# Patient Record
Sex: Female | Born: 1970
Health system: Southern US, Community
[De-identification: ages and names within clinical notes are randomized; demographics above are authoritative.]

## PROBLEM LIST (undated history)

## (undated) DIAGNOSIS — M1990 Unspecified osteoarthritis, unspecified site: Secondary | ICD-10-CM

## (undated) DIAGNOSIS — I10 Essential (primary) hypertension: Secondary | ICD-10-CM

## (undated) HISTORY — DX: Unspecified osteoarthritis, unspecified site: M19.90

## (undated) HISTORY — PX: ENDOMETRIAL ABLATION: SHX621

## (undated) HISTORY — PX: TUBAL LIGATION: SHX77

---

## 1999-01-09 ENCOUNTER — Emergency Department (HOSPITAL_COMMUNITY): Admission: EM | Admit: 1999-01-09 | Discharge: 1999-01-09 | Payer: Self-pay | Admitting: Emergency Medicine

## 2001-04-28 ENCOUNTER — Emergency Department (HOSPITAL_COMMUNITY): Admission: EM | Admit: 2001-04-28 | Discharge: 2001-04-28 | Payer: Self-pay | Admitting: Emergency Medicine

## 2001-08-29 ENCOUNTER — Encounter: Payer: Self-pay | Admitting: Emergency Medicine

## 2001-08-29 ENCOUNTER — Emergency Department (HOSPITAL_COMMUNITY): Admission: EM | Admit: 2001-08-29 | Discharge: 2001-08-29 | Payer: Self-pay | Admitting: Emergency Medicine

## 2001-12-08 ENCOUNTER — Emergency Department (HOSPITAL_COMMUNITY): Admission: EM | Admit: 2001-12-08 | Discharge: 2001-12-08 | Payer: Self-pay | Admitting: Emergency Medicine

## 2002-06-18 ENCOUNTER — Other Ambulatory Visit: Admission: RE | Admit: 2002-06-18 | Discharge: 2002-06-18 | Payer: Self-pay | Admitting: Family Medicine

## 2002-11-19 ENCOUNTER — Emergency Department (HOSPITAL_COMMUNITY): Admission: EM | Admit: 2002-11-19 | Discharge: 2002-11-19 | Payer: Self-pay | Admitting: Emergency Medicine

## 2003-08-17 ENCOUNTER — Emergency Department (HOSPITAL_COMMUNITY): Admission: EM | Admit: 2003-08-17 | Discharge: 2003-08-17 | Payer: Self-pay | Admitting: Emergency Medicine

## 2004-06-12 ENCOUNTER — Emergency Department (HOSPITAL_COMMUNITY): Admission: EM | Admit: 2004-06-12 | Discharge: 2004-06-12 | Payer: Self-pay | Admitting: Emergency Medicine

## 2004-11-07 ENCOUNTER — Emergency Department (HOSPITAL_COMMUNITY): Admission: EM | Admit: 2004-11-07 | Discharge: 2004-11-07 | Payer: Self-pay | Admitting: Family Medicine

## 2004-11-18 ENCOUNTER — Ambulatory Visit: Payer: Self-pay | Admitting: Family Medicine

## 2005-09-08 ENCOUNTER — Inpatient Hospital Stay (HOSPITAL_COMMUNITY): Admission: AD | Admit: 2005-09-08 | Discharge: 2005-09-08 | Payer: Self-pay | Admitting: Gynecology

## 2006-03-02 ENCOUNTER — Ambulatory Visit: Payer: Self-pay | Admitting: Family Medicine

## 2006-03-14 ENCOUNTER — Ambulatory Visit: Payer: Self-pay | Admitting: Family Medicine

## 2006-03-16 ENCOUNTER — Ambulatory Visit: Payer: Self-pay | Admitting: *Deleted

## 2006-09-23 ENCOUNTER — Ambulatory Visit: Payer: Self-pay | Admitting: Internal Medicine

## 2006-10-19 ENCOUNTER — Encounter (INDEPENDENT_AMBULATORY_CARE_PROVIDER_SITE_OTHER): Payer: Self-pay | Admitting: *Deleted

## 2006-11-23 ENCOUNTER — Ambulatory Visit: Payer: Self-pay | Admitting: Internal Medicine

## 2006-11-23 ENCOUNTER — Encounter (INDEPENDENT_AMBULATORY_CARE_PROVIDER_SITE_OTHER): Payer: Self-pay | Admitting: Family Medicine

## 2006-11-23 LAB — CONVERTED CEMR LAB
ALT: 11 units/L (ref 0–35)
AST: 13 units/L (ref 0–37)
Albumin: 4.3 g/dL (ref 3.5–5.2)
Alkaline Phosphatase: 96 units/L (ref 39–117)
BUN: 9 mg/dL (ref 6–23)
Basophils Absolute: 0 10*3/uL (ref 0.0–0.1)
Basophils Relative: 0 % (ref 0–1)
CO2: 24 meq/L (ref 19–32)
Calcium: 9.3 mg/dL (ref 8.4–10.5)
Chlamydia, DNA Probe: NEGATIVE
Chloride: 103 meq/L (ref 96–112)
Cholesterol: 149 mg/dL (ref 0–200)
Creatinine, Ser: 0.92 mg/dL (ref 0.40–1.20)
Eosinophils Absolute: 0.1 10*3/uL (ref 0.0–0.7)
Eosinophils Relative: 1 % (ref 0–5)
GC Probe Amp, Genital: NEGATIVE
Glucose, Bld: 97 mg/dL (ref 70–99)
HCT: 39.3 % (ref 36.0–46.0)
HDL: 63 mg/dL (ref >39)
Hemoglobin: 12.7 g/dL (ref 12.0–15.0)
LDL Cholesterol: 66 mg/dL (ref 0–99)
Lymphocytes Relative: 33 % (ref 12–46)
Lymphs Abs: 2.7 10*3/uL (ref 0.7–3.3)
MCHC: 32.3 g/dL (ref 30.0–36.0)
MCV: 85.8 fL (ref 78.0–100.0)
Monocytes Absolute: 0.5 10*3/uL (ref 0.2–0.7)
Monocytes Relative: 6 % (ref 3–11)
Neutro Abs: 4.7 10*3/uL (ref 1.7–7.7)
Neutrophils Relative %: 59 % (ref 43–77)
Platelets: 421 10*3/uL — ABNORMAL HIGH (ref 150–400)
Potassium: 3.9 meq/L (ref 3.5–5.3)
RBC: 4.58 M/uL (ref 3.87–5.11)
RDW: 15.1 % — ABNORMAL HIGH (ref 11.5–14.0)
Sodium: 140 meq/L (ref 135–145)
Total Bilirubin: 0.3 mg/dL (ref 0.3–1.2)
Total CHOL/HDL Ratio: 2.4
Total Protein: 7.9 g/dL (ref 6.0–8.3)
Triglycerides: 102 mg/dL (ref <150)
VLDL: 20 mg/dL (ref 0–40)
WBC: 8 10*3/uL (ref 4.0–10.5)

## 2007-04-18 ENCOUNTER — Inpatient Hospital Stay (HOSPITAL_COMMUNITY): Admission: AD | Admit: 2007-04-18 | Discharge: 2007-04-18 | Payer: Self-pay | Admitting: Gynecology

## 2007-05-08 ENCOUNTER — Other Ambulatory Visit: Admission: RE | Admit: 2007-05-08 | Discharge: 2007-05-08 | Payer: Self-pay | Admitting: Obstetrics and Gynecology

## 2007-11-06 ENCOUNTER — Other Ambulatory Visit: Admission: RE | Admit: 2007-11-06 | Discharge: 2007-11-06 | Payer: Self-pay | Admitting: Obstetrics & Gynecology

## 2008-06-07 ENCOUNTER — Other Ambulatory Visit: Admission: RE | Admit: 2008-06-07 | Discharge: 2008-06-07 | Payer: Self-pay | Admitting: Obstetrics and Gynecology

## 2008-10-14 ENCOUNTER — Emergency Department (HOSPITAL_COMMUNITY): Admission: EM | Admit: 2008-10-14 | Discharge: 2008-10-14 | Payer: Self-pay | Admitting: Family Medicine

## 2010-02-11 ENCOUNTER — Encounter (INDEPENDENT_AMBULATORY_CARE_PROVIDER_SITE_OTHER): Payer: Self-pay | Admitting: *Deleted

## 2010-02-11 LAB — CONVERTED CEMR LAB
ALT: 10 U/L
AST: 11 U/L
Albumin: 4.3 g/dL
Alkaline Phosphatase: 107 U/L
BUN: 9 mg/dL
CO2: 24 meq/L
Calcium: 9.7 mg/dL
Chloride: 102 meq/L
Creatinine, Ser: 0.97 mg/dL
Glucose, Bld: 100 mg/dL — ABNORMAL HIGH
HCT: 37.8 %
Hemoglobin: 11.5 g/dL — ABNORMAL LOW
MCHC: 30.4 g/dL
MCV: 81.1 fL
Platelets: 408 K/uL — ABNORMAL HIGH
Potassium: 4.1 meq/L
RBC: 4.66 M/uL
RDW: 17.8 % — ABNORMAL HIGH
Sodium: 141 meq/L
TSH: 1.538 u[IU]/mL
Total Bilirubin: 0.2 mg/dL — ABNORMAL LOW
Total Protein: 7.6 g/dL
WBC: 8.9 10*3/microliter

## 2010-02-12 ENCOUNTER — Encounter (INDEPENDENT_AMBULATORY_CARE_PROVIDER_SITE_OTHER): Payer: Self-pay | Admitting: *Deleted

## 2010-02-12 LAB — CONVERTED CEMR LAB: Ferritin: 7 ng/mL — ABNORMAL LOW (ref 10–291)

## 2010-03-13 ENCOUNTER — Other Ambulatory Visit: Payer: Self-pay | Admitting: Family Medicine

## 2010-03-13 ENCOUNTER — Encounter: Payer: Self-pay | Admitting: Internal Medicine

## 2010-03-13 LAB — CONVERTED CEMR LAB: Chlamydia, DNA Probe: POSITIVE — AB

## 2010-04-30 ENCOUNTER — Encounter: Payer: Self-pay | Admitting: Obstetrics & Gynecology

## 2010-05-11 ENCOUNTER — Encounter: Payer: Self-pay | Admitting: Family Medicine

## 2010-07-17 ENCOUNTER — Encounter: Payer: Self-pay | Admitting: Obstetrics & Gynecology

## 2010-10-26 LAB — COMPREHENSIVE METABOLIC PANEL
ALT: 13
AST: 14
Alkaline Phosphatase: 111
CO2: 26
Calcium: 9
Chloride: 102
GFR calc Af Amer: >60
GFR calc non Af Amer: >60
Glucose, Bld: 116 — ABNORMAL HIGH
Potassium: 3.6
Sodium: 137

## 2010-10-26 LAB — CBC
Hemoglobin: 12.3
RBC: 4.33
WBC: 11.5 — ABNORMAL HIGH

## 2010-10-26 LAB — DIFFERENTIAL
Basophils Relative: 0
Eosinophils Absolute: 0.1
Eosinophils Relative: 1
Lymphs Abs: 2.5
Neutrophils Relative %: 73

## 2010-10-26 LAB — URINALYSIS, ROUTINE W REFLEX MICROSCOPIC
Bilirubin Urine: NEGATIVE
Glucose, UA: NEGATIVE
Hgb urine dipstick: NEGATIVE
Ketones, ur: NEGATIVE
Protein, ur: NEGATIVE
pH: 7.5

## 2010-10-26 LAB — AMYLASE: Amylase: 57

## 2010-10-26 LAB — LIPASE, BLOOD: Lipase: 18

## 2010-10-26 LAB — WET PREP, GENITAL

## 2010-10-26 LAB — GC/CHLAMYDIA PROBE AMP, GENITAL: GC Probe Amp, Genital: NEGATIVE

## 2010-11-18 ENCOUNTER — Inpatient Hospital Stay (INDEPENDENT_AMBULATORY_CARE_PROVIDER_SITE_OTHER)
Admission: RE | Admit: 2010-11-18 | Discharge: 2010-11-18 | Disposition: A | Discharge status: Home / Self Care | Payer: Self-pay | Source: Ambulatory Visit | Attending: Emergency Medicine | Admitting: Emergency Medicine

## 2010-11-18 DIAGNOSIS — N760 Acute vaginitis: Secondary | ICD-10-CM

## 2010-11-18 LAB — WET PREP, GENITAL
Trich, Wet Prep: NONE SEEN
Yeast Wet Prep HPF POC: NONE SEEN

## 2010-11-18 LAB — POCT URINALYSIS DIP (DEVICE)
Bilirubin Urine: NEGATIVE
Glucose, UA: NEGATIVE mg/dL
Hgb urine dipstick: NEGATIVE
Ketones, ur: NEGATIVE mg/dL
Leukocytes, UA: NEGATIVE
Nitrite: NEGATIVE

## 2011-02-11 ENCOUNTER — Encounter (HOSPITAL_COMMUNITY): Payer: Self-pay | Admitting: *Deleted

## 2011-02-11 ENCOUNTER — Emergency Department (HOSPITAL_COMMUNITY)
Admission: EM | Admit: 2011-02-11 | Discharge: 2011-02-12 | Disposition: A | Discharge status: Home / Self Care | Payer: Self-pay | Attending: Emergency Medicine | Admitting: Emergency Medicine

## 2011-02-11 DIAGNOSIS — R070 Pain in throat: Secondary | ICD-10-CM | POA: Insufficient documentation

## 2011-02-11 DIAGNOSIS — J111 Influenza due to unidentified influenza virus with other respiratory manifestations: Secondary | ICD-10-CM | POA: Insufficient documentation

## 2011-02-11 DIAGNOSIS — H9209 Otalgia, unspecified ear: Secondary | ICD-10-CM | POA: Insufficient documentation

## 2011-02-11 DIAGNOSIS — R6883 Chills (without fever): Secondary | ICD-10-CM | POA: Insufficient documentation

## 2011-02-11 DIAGNOSIS — R51 Headache: Secondary | ICD-10-CM | POA: Insufficient documentation

## 2011-02-11 DIAGNOSIS — I10 Essential (primary) hypertension: Secondary | ICD-10-CM | POA: Insufficient documentation

## 2011-02-11 HISTORY — DX: Essential (primary) hypertension: I10

## 2011-02-11 MED ORDER — HYDROCODONE-ACETAMINOPHEN 7.5-500 MG/15ML PO SOLN: 15.0000 mL | Freq: Four times a day (QID) | ORAL | Status: AC | PRN

## 2011-02-11 NOTE — ED Provider Notes (Signed)
History     CSN: [id number]  Arrival date & time 02/11/11  2111   First MD Initiated Contact with Patient 02/11/11 2337      Chief Complaint  Patient presents with  . Sore Throat    (Consider location/radiation/quality/duration/timing/severity/associated sxs/prior treatment) HPI C/o a sorethroat Lt earache with a headache since Monday Chills Monday night  Past Medical History  Diagnosis Date  . Hypertension     History reviewed. No pertinent past surgical history.  History reviewed. No pertinent family history.  History  Substance Use Topics  . Smoking status: Never Smoker   . Smokeless tobacco: Not on file  . Alcohol Use: No    OB History    Grav Para Term Preterm Abortions TAB SAB Ect Mult Living                  Review of Systems Remaining review of systems unremarkable except as noted in history of present illness Allergies  Review of patient's allergies indicates no known allergies.  Home Medications   Current Outpatient Rx  Name Route Sig Dispense Refill  . HYDROCODONE-ACETAMINOPHEN 7.5-500 MG/15ML PO SOLN Oral Take 15 mLs by mouth every 6 (six) hours as needed for pain. 120 mL 0    BP 138/98  Pulse 103  Temp(Src) 98.2 F (36.8 C) (Oral)  Resp 14  SpO2 99%  LMP 02/01/2011  Physical Exam  Nursing note and vitals reviewed. Constitutional: She is oriented to person, place, and time. She appears well-developed and well-nourished. No distress.  HENT:  Head: Normocephalic and atraumatic.  Right Ear: External ear normal.  Left Ear: External ear normal.  Mouth/Throat: Oropharynx is clear and moist.  Eyes: Pupils are equal, round, and reactive to light.  Neck: Normal range of motion.  Cardiovascular: Normal rate and intact distal pulses.   Pulmonary/Chest: No respiratory distress.  Abdominal: Normal appearance. She exhibits no distension.  Musculoskeletal: Normal range of motion.  Neurological: She is alert and oriented to person, place, and  time. No cranial nerve deficit.  Skin: Skin is warm and dry. No rash noted.  Psychiatric: She has a normal mood and affect. Her behavior is normal.    ED Course  Procedures (including critical care time)  Labs Reviewed - No data to display No results found.   1. Influenza       MDM          Nelia Shi, MD 02/11/11 2348

## 2011-02-11 NOTE — ED Notes (Signed)
C/o a sorethroat  Lt earache with a headache since Monday  Chills Monday night

## 2011-02-12 NOTE — ED Notes (Signed)
Discharge inst given  Voiced unnderstanding

## 2011-03-09 ENCOUNTER — Inpatient Hospital Stay (HOSPITAL_COMMUNITY)
Admission: AD | Admit: 2011-03-09 | Discharge: 2011-03-09 | Disposition: A | Discharge status: Home / Self Care | Payer: Self-pay | Source: Ambulatory Visit | Attending: Obstetrics & Gynecology | Admitting: Obstetrics & Gynecology

## 2011-03-09 DIAGNOSIS — R109 Unspecified abdominal pain: Secondary | ICD-10-CM | POA: Insufficient documentation

## 2011-03-09 DIAGNOSIS — B9689 Other specified bacterial agents as the cause of diseases classified elsewhere: Secondary | ICD-10-CM | POA: Insufficient documentation

## 2011-03-09 DIAGNOSIS — R102 Pelvic and perineal pain: Secondary | ICD-10-CM

## 2011-03-09 DIAGNOSIS — N949 Unspecified condition associated with female genital organs and menstrual cycle: Secondary | ICD-10-CM

## 2011-03-09 DIAGNOSIS — A499 Bacterial infection, unspecified: Secondary | ICD-10-CM | POA: Insufficient documentation

## 2011-03-09 DIAGNOSIS — N760 Acute vaginitis: Secondary | ICD-10-CM | POA: Insufficient documentation

## 2011-03-09 LAB — WET PREP, GENITAL: Trich, Wet Prep: NONE SEEN

## 2011-03-09 LAB — URINALYSIS, ROUTINE W REFLEX MICROSCOPIC
Bilirubin Urine: NEGATIVE
Hgb urine dipstick: NEGATIVE
Specific Gravity, Urine: 1.015 (ref 1.005–1.030)
pH: 6.5 (ref 5.0–8.0)

## 2011-03-09 MED ORDER — METRONIDAZOLE 500 MG PO TABS: 500.0000 mg | ORAL_TABLET | Freq: Two times a day (BID) | ORAL | Status: AC

## 2011-03-09 MED ORDER — ACETAMINOPHEN 500 MG PO TABS
1000.0000 mg | ORAL_TABLET | Freq: Once | ORAL | Status: AC
  Administered 2011-03-09: 1000 mg via ORAL
  Filled 2011-03-09: qty 2

## 2011-03-09 NOTE — ED Provider Notes (Signed)
History   Pt presents today c/o lower abd pain and cramping for the past 2-4 days. She also c/o vag dc with irritation. She also c/o slight HA. She denies blurry vision, fever, dysuria, or any other problems at this time.  Chief Complaint  Patient presents with  . Abdominal Pain   HPI  OB History    Grav Para Term Preterm Abortions TAB SAB Ect Mult Living                  Past Medical History  Diagnosis Date  . Hypertension     No past surgical history on file.  No family history on file.  History  Substance Use Topics  . Smoking status: Never Smoker   . Smokeless tobacco: Not on file  . Alcohol Use: No    Allergies: No Known Allergies  Prescriptions prior to admission  Medication Sig Dispense Refill  . acetaminophen (TYLENOL) 500 MG tablet Take 500 mg by mouth every 6 (six) hours as needed. For stomach pain.        Review of Systems  Constitutional: Negative for fever and chills.  Eyes: Negative for blurred vision and double vision.  Respiratory: Negative for cough, hemoptysis, sputum production, shortness of breath and wheezing.   Cardiovascular: Negative for chest pain and palpitations.  Gastrointestinal: Positive for abdominal pain. Negative for nausea and vomiting.  Genitourinary: Negative for dysuria, urgency, frequency and hematuria.  Skin: Negative for rash.  Neurological: Positive for headaches. Negative for dizziness.  Psychiatric/Behavioral: Negative for depression and suicidal ideas.   Physical Exam   Blood pressure 137/107, pulse 81, temperature 98.1 F (36.7 C), temperature source Oral, resp. rate 16, height 5\' 3"  (1.6 m), weight 230 lb 3.2 oz (104.418 kg), last menstrual period 02/28/2011, SpO2 99.00%.  Physical Exam  Nursing note and vitals reviewed. Constitutional: She is oriented to person, place, and time. She appears well-developed and well-nourished. No distress.  HENT:  Head: Normocephalic and atraumatic.  Eyes: EOM are normal. Pupils  are equal, round, and reactive to light.  GI: Soft. She exhibits no distension and no mass. There is no tenderness. There is no rebound and no guarding.  Genitourinary: No bleeding around the vagina. Vaginal discharge found.       Cervix Lg/closed. Uterus NL size and shape with no definite adnexal masses. However, exam difficult secondary to morbid obesity.  Neurological: She is alert and oriented to person, place, and time.  Skin: Skin is warm and dry. She is not diaphoretic.  Psychiatric: She has a normal mood and affect. Her behavior is normal. Judgment and thought content normal.    MAU Course  Procedures  Wet prep and GC/Chlamydia cultures done.  Results for orders placed during the hospital encounter of 03/09/11 (from the past 72 hour(s))  URINALYSIS, ROUTINE W REFLEX MICROSCOPIC     Status: Normal   Collection Time   03/09/11  9:20 AM      Component Value Range Comment   Color, Urine YELLOW  YELLOW     APPearance CLEAR  CLEAR     Specific Gravity, Urine 1.015  1.005 - 1.030     pH 6.5  5.0 - 8.0     Glucose, UA NEGATIVE  NEGATIVE (mg/dL)    Hgb urine dipstick NEGATIVE  NEGATIVE     Bilirubin Urine NEGATIVE  NEGATIVE     Ketones, ur NEGATIVE  NEGATIVE (mg/dL)    Protein, ur NEGATIVE  NEGATIVE (mg/dL)    Urobilinogen, UA 0.2  0.0 - 1.0 (mg/dL)    Nitrite NEGATIVE  NEGATIVE     Leukocytes, UA NEGATIVE  NEGATIVE  MICROSCOPIC NOT DONE ON URINES WITH NEGATIVE PROTEIN, BLOOD, LEUKOCYTES, NITRITE, OR GLUCOSE <1000 mg/dL.  POCT PREGNANCY, URINE     Status: Normal   Collection Time   03/09/11  9:44 AM      Component Value Range Comment   Preg Test, Ur NEGATIVE  NEGATIVE    WET PREP, GENITAL     Status: Abnormal   Collection Time   03/09/11  9:51 AM      Component Value Range Comment   Yeast Wet Prep HPF POC NONE SEEN  NONE SEEN     Trich, Wet Prep NONE SEEN  NONE SEEN     Clue Cells Wet Prep HPF POC FEW (*) NONE SEEN     WBC, Wet Prep HPF POC FEW (*) NONE SEEN  FEW BACTERIA SEEN     Assessment and Plan  BV: discussed with pt at length. Will tx with Flagyl. Warned of antabuse reaction.   Pelvic pain: no acute process at this time. Advised pt to f/u with PCP. Discussed diet, activity, risks, and precautions.  Clinton Gallant. Kathy Wares III, DrHSc, MPAS, PA-C  03/09/2011, 9:53 AM   Henrietta Hoover, PA 03/09/11 1025

## 2011-03-09 NOTE — ED Provider Notes (Signed)
Attestation of Attending Supervision of Advanced Practitioner: Evaluation and management procedures were performed by the PA/NP/CNM/OB Fellow under my supervision/collaboration. Chart reviewed, and agree with management and plan.  Jaynie Collins, M.D. 03/09/2011 11:25 AM

## 2011-03-09 NOTE — Progress Notes (Signed)
States has been on BP medication. When she was seen last at [facility], she saw a different doctor than she usually sees. He told her that her BP was ok and that she no longer needed to take medication. Patient states she still has some refills on her medication and plans to resume.

## 2011-03-09 NOTE — Progress Notes (Signed)
Patient states she started having lower abdominal pain that radiates to the low back on 2-4. Has a slight yellow vaginal discharge this am.

## 2011-03-10 LAB — GC/CHLAMYDIA PROBE AMP, GENITAL
Chlamydia, DNA Probe: NEGATIVE
GC Probe Amp, Genital: NEGATIVE

## 2012-08-07 ENCOUNTER — Inpatient Hospital Stay (HOSPITAL_COMMUNITY)
Admission: AD | Admit: 2012-08-07 | Discharge: 2012-08-07 | Disposition: A | Discharge status: Home / Self Care | Payer: Medicaid Other | Source: Ambulatory Visit | Attending: Obstetrics & Gynecology | Admitting: Obstetrics & Gynecology

## 2012-08-07 ENCOUNTER — Encounter (HOSPITAL_COMMUNITY): Payer: Self-pay

## 2012-08-07 DIAGNOSIS — N390 Urinary tract infection, site not specified: Secondary | ICD-10-CM | POA: Insufficient documentation

## 2012-08-07 DIAGNOSIS — R300 Dysuria: Secondary | ICD-10-CM | POA: Insufficient documentation

## 2012-08-07 LAB — URINALYSIS, ROUTINE W REFLEX MICROSCOPIC
Bilirubin Urine: NEGATIVE
Ketones, ur: NEGATIVE mg/dL
Protein, ur: NEGATIVE mg/dL
Urobilinogen, UA: 0.2 mg/dL (ref 0.0–1.0)

## 2012-08-07 LAB — URINE MICROSCOPIC-ADD ON

## 2012-08-07 LAB — WET PREP, GENITAL: Yeast Wet Prep HPF POC: NONE SEEN

## 2012-08-07 MED ORDER — CIPROFLOXACIN HCL 500 MG PO TABS: 500.0000 mg | ORAL_TABLET | Freq: Two times a day (BID) | ORAL | Status: AC

## 2012-08-07 NOTE — MAU Note (Signed)
Patient is in with c/o pelvic pain that radiates to her lower back, dysuria since Thursday and odorous vaginal discharge. Patient reports having tubal ligation.

## 2012-08-07 NOTE — MAU Note (Signed)
Started last Thurs, with lower abd and low back when uses restroom.  Now feeling pressure also, urgency-seems like bladder stays full.

## 2012-08-07 NOTE — MAU Provider Note (Signed)
Chief Complaint: Pelvic Pain   First Provider Initiated Contact with Patient 08/07/12 1311     SUBJECTIVE HPI: Renee Collier is a 42 y.o. [id number] who presents to maternity admissions reporting dysuria and pelvic pressure when she urinates.  She also reports pink bleeding when she wipes and an increase in vaginal discharge without odor.  She denies vaginal itching/burning, h/a, dizziness, n/v, or fever/chills.  .   Past Medical History  Diagnosis Date  . Hypertension    Past Surgical History  Procedure Laterality Date  . Tubal ligation     History   Social History  . Marital Status: Single    Spouse Name: N/A    Number of Children: N/A  . Years of Education: N/A   Occupational History  . Not on file.   Social History Main Topics  . Smoking status: Never Smoker   . Smokeless tobacco: Not on file  . Alcohol Use: No  . Drug Use: No  . Sexually Active: Yes    Birth Control/ Protection: Surgical   Other Topics Concern  . Not on file   Social History Narrative  . No narrative on file   No current facility-administered medications on file prior to encounter.   Current Outpatient Prescriptions on File Prior to Encounter  Medication Sig Dispense Refill  . acetaminophen (TYLENOL) 500 MG tablet Take 1,000 mg by mouth every 6 (six) hours as needed for pain. For stomach pain.       No Known Allergies  ROS: Pertinent items in HPI  OBJECTIVE Blood pressure 130/85, pulse 87, temperature 98.6 F (37 C), temperature source Oral, resp. rate 20, weight 99.338 kg (219 lb), last menstrual period 07/07/2012. GENERAL: Well-developed, well-nourished female in no acute distress.  HEENT: Normocephalic HEART: normal rate RESP: normal effort ABDOMEN: Soft, non-tender EXTREMITIES: Nontender, no edema NEURO: Alert and oriented Pelvic exam: Cervix pink, visually closed, without lesion, scant white creamy discharge, vaginal walls and external genitalia normal Bimanual exam: Cervix  0/long/high, firm, anterior, neg CMT, uterus nontender, nonenlarged, adnexa without tenderness, enlargement, or mass  LAB RESULTS Results for orders placed during the hospital encounter of 08/07/12 (from the past 24 hour(s))  URINALYSIS, ROUTINE W REFLEX MICROSCOPIC     Status: Abnormal   Collection Time    08/07/12 12:20 PM      Result Value Range   Color, Urine YELLOW  YELLOW   APPearance HAZY (*) CLEAR   Specific Gravity, Urine 1.020  1.005 - 1.030   pH 7.5  5.0 - 8.0   Glucose, UA NEGATIVE  NEGATIVE mg/dL   Hgb urine dipstick TRACE (*) NEGATIVE   Bilirubin Urine NEGATIVE  NEGATIVE   Ketones, ur NEGATIVE  NEGATIVE mg/dL   Protein, ur NEGATIVE  NEGATIVE mg/dL   Urobilinogen, UA 0.2  0.0 - 1.0 mg/dL   Nitrite NEGATIVE  NEGATIVE   Leukocytes, UA NEGATIVE  NEGATIVE  URINE MICROSCOPIC-ADD ON     Status: Abnormal   Collection Time    08/07/12 12:20 PM      Result Value Range   Squamous Epithelial / LPF MANY (*) RARE   WBC, UA 0-2  <3 WBC/hpf   RBC / HPF 0-2  <3 RBC/hpf   Bacteria, UA RARE  RARE  POCT PREGNANCY, URINE     Status: None   Collection Time    08/07/12 12:33 PM      Result Value Range   Preg Test, Ur NEGATIVE  NEGATIVE  WET PREP, GENITAL  Status: Abnormal   Collection Time    08/07/12  1:54 PM      Result Value Range   Yeast Wet Prep HPF POC NONE SEEN  NONE SEEN   Trich, Wet Prep NONE SEEN  NONE SEEN   Clue Cells Wet Prep HPF POC NONE SEEN  NONE SEEN   WBC, Wet Prep HPF POC FEW (*) NONE SEEN    ASSESSMENT 1. UTI (urinary tract infection), uncomplicated     PLAN Discharge home Cipro BID x3 days Increase PO fluids F/U with Gyn provider Return to MAU as needed   Sharen Counter Certified Nurse-Midwife 08/07/2012  2:18 PM

## 2012-08-08 LAB — GC/CHLAMYDIA PROBE AMP: CT Probe RNA: NEGATIVE

## 2012-08-09 LAB — URINE CULTURE: Colony Count: >=100000

## 2012-08-09 NOTE — MAU Provider Note (Signed)
Attestation of Attending Supervision of Advanced Practitioner (PA/CNM/NP): Evaluation and management procedures were performed by the Advanced Practitioner under my supervision and collaboration.  I have reviewed the Advanced Practitioner's note and chart, and I agree with the management and plan.  Arihanna Estabrook, MD, FACOG Attending Obstetrician & Gynecologist Faculty Practice, [facility]  

## 2013-03-29 ENCOUNTER — Ambulatory Visit: Payer: Medicaid Other

## 2013-09-12 ENCOUNTER — Encounter (HOSPITAL_COMMUNITY): Payer: Self-pay

## 2013-09-12 ENCOUNTER — Inpatient Hospital Stay (HOSPITAL_COMMUNITY)
Admission: AD | Admit: 2013-09-12 | Discharge: 2013-09-12 | Disposition: A | Discharge status: Home / Self Care | Payer: Medicaid Other | Source: Ambulatory Visit | Attending: Obstetrics & Gynecology | Admitting: Obstetrics & Gynecology

## 2013-09-12 DIAGNOSIS — R109 Unspecified abdominal pain: Secondary | ICD-10-CM

## 2013-09-12 DIAGNOSIS — I10 Essential (primary) hypertension: Secondary | ICD-10-CM | POA: Diagnosis not present

## 2013-09-12 DIAGNOSIS — N760 Acute vaginitis: Secondary | ICD-10-CM

## 2013-09-12 DIAGNOSIS — B9689 Other specified bacterial agents as the cause of diseases classified elsewhere: Secondary | ICD-10-CM | POA: Diagnosis not present

## 2013-09-12 DIAGNOSIS — A499 Bacterial infection, unspecified: Secondary | ICD-10-CM | POA: Insufficient documentation

## 2013-09-12 LAB — URINALYSIS, ROUTINE W REFLEX MICROSCOPIC
BILIRUBIN URINE: NEGATIVE
Glucose, UA: NEGATIVE mg/dL
Hgb urine dipstick: NEGATIVE
KETONES UR: NEGATIVE mg/dL
Leukocytes, UA: NEGATIVE
NITRITE: NEGATIVE
Protein, ur: NEGATIVE mg/dL
SPECIFIC GRAVITY, URINE: 1.01 (ref 1.005–1.030)
UROBILINOGEN UA: 0.2 mg/dL (ref 0.0–1.0)
pH: 8 (ref 5.0–8.0)

## 2013-09-12 LAB — CBC
HCT: 35.4 % — ABNORMAL LOW (ref 36.0–46.0)
HEMOGLOBIN: 11.1 g/dL — AB (ref 12.0–15.0)
MCH: 24.8 pg — ABNORMAL LOW (ref 26.0–34.0)
MCHC: 31.4 g/dL (ref 30.0–36.0)
MCV: 79 fL (ref 78.0–100.0)
Platelets: 474 10*3/uL — ABNORMAL HIGH (ref 150–400)
RBC: 4.48 MIL/uL (ref 3.87–5.11)
RDW: 16.1 % — ABNORMAL HIGH (ref 11.5–15.5)
WBC: 7.5 10*3/uL (ref 4.0–10.5)

## 2013-09-12 LAB — WET PREP, GENITAL
Trich, Wet Prep: NONE SEEN
Yeast Wet Prep HPF POC: NONE SEEN

## 2013-09-12 LAB — POCT PREGNANCY, URINE: Preg Test, Ur: NEGATIVE

## 2013-09-12 MED ORDER — KETOROLAC TROMETHAMINE 60 MG/2ML IM SOLN
60.0000 mg | Freq: Once | INTRAMUSCULAR | Status: AC
  Administered 2013-09-12: 60 mg via INTRAMUSCULAR
  Filled 2013-09-12: qty 2

## 2013-09-12 MED ORDER — HYDRALAZINE HCL 10 MG PO TABS: 10.0000 mg | ORAL_TABLET | Freq: Three times a day (TID) | ORAL | Status: DC

## 2013-09-12 MED ORDER — METRONIDAZOLE 500 MG PO TABS: 500.0000 mg | ORAL_TABLET | Freq: Two times a day (BID) | ORAL | Status: DC

## 2013-09-12 NOTE — MAU Note (Signed)
Pain started on Sat. Pain is in LLQ,Radiates/pressure down leg when walk. Noted some d/c yesterday

## 2013-09-12 NOTE — MAU Provider Note (Signed)
History     CSN: [id number]  Arrival date and time: 09/12/13 1017   First Provider Initiated Contact with Patient 09/12/13 1059      Chief Complaint  Patient presents with  . Abdominal Pain   HPI  [name] is a 43 y.o. female [phone] who presents with complaint of left sided abdominal pain. The pain started Saturday while she was at work. The pain started as cramping; worse when she walks. The pain is described as cramping/pressure. She also complains of a vaginal discharge that is described at dark yellow- no odor. She has normal menstrual cycles; LMP August 3rd and it was normal. Last intercourse was 1 month ago; no new sexual partners.  Pt has a history of hypertension; stopped taking her BP medication (hydralazine due to insurance) unable to see a Dr. Until September.  She does have an appointment scheduled in September for a physical with a PCP.  She rates her left sided abdominal pain 9/10; pt has not taken anything for pain today.    OB History   Grav Para Term Preterm Abortions TAB SAB Ect Mult Living   3 3 3       3       Past Medical History  Diagnosis Date  . Hypertension     Past Surgical History  Procedure Laterality Date  . Tubal ligation      History reviewed. No pertinent family history.  History  Substance Use Topics  . Smoking status: Never Smoker   . Smokeless tobacco: Not on file  . Alcohol Use: No    Allergies: No Known Allergies  Prescriptions prior to admission  Medication Sig Dispense Refill  . acetaminophen (TYLENOL) 500 MG tablet Take 1,000 mg by mouth every 6 (six) hours as needed for pain. For stomach pain.       Results for orders placed during the hospital encounter of 09/12/13 (from the past 48 hour(s))  URINALYSIS, ROUTINE W REFLEX MICROSCOPIC     Status: None   Collection Time    09/12/13 10:35 AM      Result Value Ref Range   Color, Urine YELLOW  YELLOW   APPearance CLEAR  CLEAR   Specific Gravity, Urine 1.010   1.005 - 1.030   pH 8.0  5.0 - 8.0   Glucose, UA NEGATIVE  NEGATIVE mg/dL   Hgb urine dipstick NEGATIVE  NEGATIVE   Bilirubin Urine NEGATIVE  NEGATIVE   Ketones, ur NEGATIVE  NEGATIVE mg/dL   Protein, ur NEGATIVE  NEGATIVE mg/dL   Urobilinogen, UA 0.2  0.0 - 1.0 mg/dL   Nitrite NEGATIVE  NEGATIVE   Leukocytes, UA NEGATIVE  NEGATIVE   Comment: MICROSCOPIC NOT DONE ON URINES WITH NEGATIVE PROTEIN, BLOOD, LEUKOCYTES, NITRITE, OR GLUCOSE <1000 mg/dL.  POCT PREGNANCY, URINE     Status: None   Collection Time    09/12/13 10:45 AM      Result Value Ref Range   Preg Test, Ur NEGATIVE  NEGATIVE   Comment:            THE SENSITIVITY OF THIS     METHODOLOGY IS >24 mIU/mL  CBC     Status: Abnormal   Collection Time    09/12/13 11:19 AM      Result Value Ref Range   WBC 7.5  4.0 - 10.5 K/uL   RBC 4.48  3.87 - 5.11 MIL/uL   Hemoglobin 11.1 (*) 12.0 - 15.0 g/dL   HCT 35.4 (*) 36.0 - 46.0 %  MCV 79.0  78.0 - 100.0 fL   MCH 24.8 (*) 26.0 - 34.0 pg   MCHC 31.4  30.0 - 36.0 g/dL   RDW 16.1 (*) 11.5 - 15.5 %   Platelets 474 (*) 150 - 400 K/uL  WET PREP, GENITAL     Status: Abnormal   Collection Time    09/12/13 11:20 AM      Result Value Ref Range   Yeast Wet Prep HPF POC NONE SEEN  NONE SEEN   Trich, Wet Prep NONE SEEN  NONE SEEN   Clue Cells Wet Prep HPF POC FEW (*) NONE SEEN   WBC, Wet Prep HPF POC FEW (*) NONE SEEN   Comment: FEW BACTERIA SEEN    Review of Systems  Constitutional: Negative for fever and chills.  Eyes: Negative for blurred vision.  Respiratory: Negative for shortness of breath.   Cardiovascular: Negative for chest pain.  Gastrointestinal: Positive for heartburn and abdominal pain (Left sided abdominal pain ). Negative for nausea, vomiting, diarrhea, constipation and blood in stool.  Genitourinary: Negative for dysuria, urgency, frequency and hematuria.       + vaginal discharge. No vaginal bleeding. No dysuria.   Musculoskeletal: Negative for back pain.   Neurological: Negative for headaches.   Physical Exam   Blood pressure 171/101, pulse 78, temperature 98.7 F (37.1 C), temperature source Oral, resp. rate 18, height 5' 2.5" (1.588 m), weight 101.606 kg (224 lb), last menstrual period 09/03/2013.  Physical Exam  Constitutional: She is oriented to person, place, and time. She appears well-developed and well-nourished. No distress.  HENT:  Head: Normocephalic.  Neck: Neck supple.  Respiratory: Effort normal.  GI: Soft. She exhibits no distension. There is tenderness. There is guarding. There is no rebound.  Left sided abdominal tenderness on exam.   Genitourinary: Vaginal discharge found.  Speculum exam: Vagina - Small amount of creamy, white discharge, Mild odor Cervix - scant contact bleeding Bimanual exam: Cervix closed, mild CMT  Uterus non tender, normal size Left Adnexal tenderness, no masses bilaterally GC/Chlam, wet prep done Chaperone present for exam.   Musculoskeletal: Normal range of motion.  Neurological: She is alert and oriented to person, place, and time.  Skin: Skin is warm. She is not diaphoretic.  Psychiatric: Her behavior is normal.    MAU Course  Procedures None  MDM Wet prep GC Toradol 60 mg IM CBC  Pt's pain is 5/10 following Toradol   Assessment and Plan   Assessment:  1. Essential hypertension   2. Abdominal pain in female   3. Bacterial vaginosis     Plan:  Discharge home in stable condition Reviewed signs and symptoms of stroke and Heart attack with patient; pt to [facility] ED if any of these symptoms occur RX: Hydralazine         Flagyl- no alcohol Keep your follow up appointment with your primary care Dr.  Madaline Brilliant to take tylenol or ibuprofen for pain; as directed on the bottle.    Darrelyn Hillock Rasch, NP  09/15/2013, 9:18 AM

## 2013-09-12 NOTE — Discharge Instructions (Signed)
Abdominal Pain, Women °Abdominal (stomach, pelvic, or belly) pain can be caused by many things. It is important to tell your doctor: °· The location of the pain. °· Does it come and go or is it present all the time? °· Are there things that start the pain (eating certain foods, exercise)? °· Are there other symptoms associated with the pain (fever, nausea, vomiting, diarrhea)? °All of this is helpful to know when trying to find the cause of the pain. °CAUSES  °· Stomach: virus or bacteria infection, or ulcer. °· Intestine: appendicitis (inflamed appendix), regional ileitis (Crohn's disease), ulcerative colitis (inflamed colon), irritable bowel syndrome, diverticulitis (inflamed diverticulum of the colon), or cancer of the stomach or intestine. °· Gallbladder disease or stones in the gallbladder. °· Kidney disease, kidney stones, or infection. °· Pancreas infection or cancer. °· Fibromyalgia (pain disorder). °· Diseases of the female organs: °¨ Uterus: fibroid (non-cancerous) tumors or infection. °¨ Fallopian tubes: infection or tubal pregnancy. °¨ Ovary: cysts or tumors. °¨ Pelvic adhesions (scar tissue). °¨ Endometriosis (uterus lining tissue growing in the pelvis and on the pelvic organs). °¨ Pelvic congestion syndrome (female organs filling up with blood just before the menstrual period). °¨ Pain with the menstrual period. °¨ Pain with ovulation (producing an egg). °¨ Pain with an IUD (intrauterine device, birth control) in the uterus. °¨ Cancer of the female organs. °· Functional pain (pain not caused by a disease, may improve without treatment). °· Psychological pain. °· Depression. °DIAGNOSIS  °Your doctor will decide the seriousness of your pain by doing an examination. °· Blood tests. °· X-rays. °· Ultrasound. °· CT scan (computed tomography, special type of X-ray). °· MRI (magnetic resonance imaging). °· Cultures, for infection. °· Barium enema (dye inserted in the large intestine, to better view it with  X-rays). °· Colonoscopy (looking in intestine with a lighted tube). °· Laparoscopy (minor surgery, looking in abdomen with a lighted tube). °· Major abdominal exploratory surgery (looking in abdomen with a large incision). °TREATMENT  °The treatment will depend on the cause of the pain.  °· Many cases can be observed and treated at home. °· Over-the-counter medicines recommended by your caregiver. °· Prescription medicine. °· Antibiotics, for infection. °· Birth control pills, for painful periods or for ovulation pain. °· Hormone treatment, for endometriosis. °· Nerve blocking injections. °· Physical therapy. °· Antidepressants. °· Counseling with a psychologist or psychiatrist. °· Minor or major surgery. °HOME CARE INSTRUCTIONS  °· Do not take laxatives, unless directed by your caregiver. °· Take over-the-counter pain medicine only if ordered by your caregiver. Do not take aspirin because it can cause an upset stomach or bleeding. °· Try a clear liquid diet (broth or water) as ordered by your caregiver. Slowly move to a bland diet, as tolerated, if the pain is related to the stomach or intestine. °· Have a thermometer and take your temperature several times a day, and record it. °· Bed rest and sleep, if it helps the pain. °· Avoid sexual intercourse, if it causes pain. °· Avoid stressful situations. °· Keep your follow-up appointments and tests, as your caregiver orders. °· If the pain does not go away with medicine or surgery, you may try: °¨ Acupuncture. °¨ Relaxation exercises (yoga, meditation). °¨ Group therapy. °¨ Counseling. °SEEK MEDICAL CARE IF:  °· You notice certain foods cause stomach pain. °· Your home care treatment is not helping your pain. °· You need stronger pain medicine. °· You want your IUD removed. °· You feel faint or   lightheaded. °· You develop nausea and vomiting. °· You develop a rash. °· You are having side effects or an allergy to your medicine. °SEEK IMMEDIATE MEDICAL CARE IF:  °· Your  pain does not go away or gets worse. °· You have a fever. °· Your pain is felt only in portions of the abdomen. The right side could possibly be appendicitis. The left lower portion of the abdomen could be colitis or diverticulitis. °· You are passing blood in your stools (bright red or black tarry stools, with or without vomiting). °· You have blood in your urine. °· You develop chills, with or without a fever. °· You pass out. °MAKE SURE YOU:  °· Understand these instructions. °· Will watch your condition. °· Will get help right away if you are not doing well or get worse. °Document Released: 11/15/2006 Document Revised: 06/04/2013 Document Reviewed: 12/05/2008 °[employer]® Patient Information ©2015 [employer]. This information is not intended to replace advice given to you by your health care provider. Make sure you discuss any questions you have with your health care provider. ° °

## 2013-09-13 LAB — GC/CHLAMYDIA PROBE AMP
CT PROBE, AMP APTIMA: NEGATIVE
GC PROBE AMP APTIMA: NEGATIVE

## 2013-12-03 ENCOUNTER — Encounter (HOSPITAL_COMMUNITY): Payer: Self-pay

## 2013-12-18 ENCOUNTER — Inpatient Hospital Stay (HOSPITAL_COMMUNITY): Payer: Self-pay

## 2013-12-18 ENCOUNTER — Encounter (HOSPITAL_COMMUNITY): Payer: Self-pay | Admitting: *Deleted

## 2013-12-18 ENCOUNTER — Inpatient Hospital Stay (HOSPITAL_COMMUNITY)
Admission: AD | Admit: 2013-12-18 | Discharge: 2013-12-18 | Disposition: A | Discharge status: Home / Self Care | Payer: Self-pay | Source: Ambulatory Visit | Attending: Obstetrics & Gynecology | Admitting: Obstetrics & Gynecology

## 2013-12-18 DIAGNOSIS — N939 Abnormal uterine and vaginal bleeding, unspecified: Secondary | ICD-10-CM | POA: Diagnosis present

## 2013-12-18 DIAGNOSIS — R9389 Abnormal findings on diagnostic imaging of other specified body structures: Secondary | ICD-10-CM | POA: Diagnosis present

## 2013-12-18 DIAGNOSIS — R938 Abnormal findings on diagnostic imaging of other specified body structures: Secondary | ICD-10-CM | POA: Insufficient documentation

## 2013-12-18 LAB — CBC
HEMATOCRIT: 30 % — AB (ref 36.0–46.0)
HEMOGLOBIN: 9.4 g/dL — AB (ref 12.0–15.0)
MCH: 24.9 pg — ABNORMAL LOW (ref 26.0–34.0)
MCHC: 31.3 g/dL (ref 30.0–36.0)
MCV: 79.4 fL (ref 78.0–100.0)
Platelets: 525 10*3/uL — ABNORMAL HIGH (ref 150–400)
RBC: 3.78 MIL/uL — ABNORMAL LOW (ref 3.87–5.11)
RDW: 17.3 % — AB (ref 11.5–15.5)
WBC: 9.2 10*3/uL (ref 4.0–10.5)

## 2013-12-18 LAB — URINALYSIS, ROUTINE W REFLEX MICROSCOPIC
GLUCOSE, UA: NEGATIVE mg/dL
Ketones, ur: 15 mg/dL — AB
Nitrite: POSITIVE — AB
PH: 7 (ref 5.0–8.0)
Protein, ur: 30 mg/dL — AB
SPECIFIC GRAVITY, URINE: 1.015 (ref 1.005–1.030)
Urobilinogen, UA: 0.2 mg/dL (ref 0.0–1.0)

## 2013-12-18 LAB — URINE MICROSCOPIC-ADD ON

## 2013-12-18 LAB — POCT PREGNANCY, URINE: Preg Test, Ur: NEGATIVE

## 2013-12-18 MED ORDER — IBUPROFEN 600 MG PO TABS: 600.0000 mg | ORAL_TABLET | Freq: Four times a day (QID) | ORAL | Status: DC | PRN

## 2013-12-18 MED ORDER — MEGESTROL ACETATE 40 MG PO TABS: ORAL_TABLET | ORAL | Status: DC

## 2013-12-18 MED ORDER — KETOROLAC TROMETHAMINE 60 MG/2ML IM SOLN
60.0000 mg | Freq: Once | INTRAMUSCULAR | Status: AC
  Administered 2013-12-18: 60 mg via INTRAMUSCULAR
  Filled 2013-12-18: qty 2

## 2013-12-18 NOTE — MAU Note (Signed)
Been on cycle since the 1st.  Cycle was late.  Started out lght like a regular period.  Has gotten progressively heavier.   Passing clots.pain has gotten worse.  When walks, it radiates down her leg.

## 2013-12-18 NOTE — [facility] Provider Note (Signed)
History     CSN: [id number]  Arrival date and time: 12/18/13 1139   First Provider Initiated Contact with Patient 12/18/13 1218      Chief Complaint  Patient presents with  . Vaginal Bleeding  . Abdominal Pain  . Headache   The patient is presenting today with complaint of menorrhagia associated with LLQ abdominal pain. Ms. Spiker  Previously experienced menstration beginning on the 28th and lasting for 3-4 days every month for the past 17 years. At the beginning of this month (November), she began menstruating and is still having heavy vaginal bleeding 17 days later associated with the passage of clots. She reports LLQ pain and describes as moderate in severity, sharp, and intermittent. She is not pregnant. Pertinent negatives include no chills, diarrhea, dysuria, fever, frequency, nausea, urgency, or vomiting.   Vaginal Bleeding The patient's primary symptoms include vaginal bleeding (associated with passing blood  clots). The patient's pertinent negatives include no vaginal discharge. This is a new problem. Episode onset: Beginning 17 days ago. The problem occurs constantly. The problem has been gradually worsening. The pain is moderate. The problem affects the left side. She is not pregnant. Associated symptoms include abdominal pain (sharp pain in LLQ) and headaches. Pertinent negatives include no chills, diarrhea, dysuria, fever, frequency, nausea, urgency or vomiting.  Abdominal Pain Associated symptoms include headaches. Pertinent negatives include no diarrhea, dysuria, fever, frequency, nausea or vomiting.  Headache  Associated symptoms include abdominal pain (sharp pain in LLQ). Pertinent negatives include no blurred vision, fever, nausea or vomiting.    Pertinent Gynecological History: Menses: Menstruation occurs every 28 days, lasts for 3-4 days and is regular. Contraception: tubal ligation DES exposure: unknown Blood transfusions: none Sexually transmitted diseases: no  past history     Past Medical History  Diagnosis Date  . Hypertension     Past Surgical History  Procedure Laterality Date  . Tubal ligation      History reviewed. No pertinent family history.  History  Substance Use Topics  . Smoking status: Never Smoker   . Smokeless tobacco: Not on file  . Alcohol Use: No    Allergies: No Known Allergies  Prescriptions prior to admission  Medication Sig Dispense Refill Last Dose  . acetaminophen (TYLENOL) 500 MG tablet Take 1,000 mg by mouth every 6 (six) hours as needed for pain. For stomach pain.   12/18/2013 at Unknown time  . hydrALAZINE (APRESOLINE) 10 MG tablet Take 1 tablet (10 mg total) by mouth 3 (three) times daily. (Patient not taking: Reported on 12/18/2013) 90 tablet 1   . metroNIDAZOLE (FLAGYL) 500 MG tablet Take 1 tablet (500 mg total) by mouth 2 (two) times daily. (Patient not taking: Reported on 12/18/2013) 14 tablet 0     Review of Systems  Constitutional: Negative for fever and chills.  Eyes: Negative for blurred vision and double vision.  Cardiovascular: Negative for chest pain, palpitations and orthopnea.  Gastrointestinal: Positive for abdominal pain (sharp pain in LLQ). Negative for nausea, vomiting and diarrhea.  Genitourinary: Positive for vaginal bleeding. Negative for dysuria, urgency, frequency and vaginal discharge.  Neurological: Positive for headaches.   Physical Exam   Blood pressure 151/98, pulse 99, temperature 98.2 F (36.8 C), temperature source Oral, resp. rate 18, height 5' 2.56" (1.589 m), weight 97.977 kg (216 lb), last menstrual period 12/02/2013.  Physical Exam  Constitutional: She is oriented to person, place, and time. She appears well-developed and well-nourished.  Cardiovascular: Normal rate, regular rhythm and normal heart sounds.  Respiratory: Effort normal and breath sounds normal.  GI: She exhibits no distension and no mass. There is tenderness. There is no rebound and no  guarding.  Genitourinary: There is bleeding in the vagina.  Neurological: She is alert and oriented to person, place, and time.  Skin: Skin is warm and dry.  Pelvic exam: Cervix pink, visually closed, without lesion, moderate amount dark red bleeding without clots, vaginal walls and external genitalia normal Bimanual exam: Cervix 0/long/high, firm, anterior, neg CMT, uterus nontender, slightly enlarged, adnexa without tenderness, enlargement, or mass  Results for orders placed or performed during the hospital encounter of 12/18/13 (from the past 48 hour(s))  Urinalysis, Routine w reflex microscopic     Status: Abnormal   Collection Time: 12/18/13 11:55 AM  Result Value Ref Range   Color, Urine YELLOW YELLOW   APPearance CLEAR CLEAR   Specific Gravity, Urine 1.015 1.005 - 1.030   pH 7.0 5.0 - 8.0   Glucose, UA NEGATIVE NEGATIVE mg/dL   Hgb urine dipstick LARGE (A) NEGATIVE   Bilirubin Urine SMALL (A) NEGATIVE   Ketones, ur 15 (A) NEGATIVE mg/dL   Protein, ur 30 (A) NEGATIVE mg/dL   Urobilinogen, UA 0.2 0.0 - 1.0 mg/dL   Nitrite POSITIVE (A) NEGATIVE   Leukocytes, UA TRACE (A) NEGATIVE  Urine microscopic-add on     Status: Abnormal   Collection Time: 12/18/13 11:55 AM  Result Value Ref Range   Squamous Epithelial / LPF FEW (A) RARE   RBC / HPF TOO NUMEROUS TO COUNT <3 RBC/hpf   Bacteria, UA FEW (A) RARE  Pregnancy, urine POC     Status: None   Collection Time: 12/18/13 12:06 PM  Result Value Ref Range   Preg Test, Ur NEGATIVE NEGATIVE    Comment:        THE SENSITIVITY OF THIS METHODOLOGY IS >24 mIU/mL   CBC     Status: Abnormal   Collection Time: 12/18/13 12:25 PM  Result Value Ref Range   WBC 9.2 4.0 - 10.5 K/uL   RBC 3.78 (L) 3.87 - 5.11 MIL/uL   Hemoglobin 9.4 (L) 12.0 - 15.0 g/dL   HCT 30.0 (L) 36.0 - 46.0 %   MCV 79.4 78.0 - 100.0 fL   MCH 24.9 (L) 26.0 - 34.0 pg   MCHC 31.3 30.0 - 36.0 g/dL   RDW 17.3 (H) 11.5 - 15.5 %   Platelets 525 (H) 150 - 400 K/uL   US  Transvaginal Non-ob  12/18/2013   CLINICAL DATA:  Dysfunctional uterine bleeding with clots  EXAM: TRANSABDOMINAL AND TRANSVAGINAL ULTRASOUND OF PELVIS  TECHNIQUE: Both transabdominal and transvaginal ultrasound examinations of the pelvis were performed. Transabdominal technique was performed for global imaging of the pelvis including uterus, ovaries, adnexal regions, and pelvic cul-de-sac. It was necessary to proceed with endovaginal exam following the transabdominal exam to visualize the endometrium and bilateral ovaries.  COMPARISON:  04/18/2007  FINDINGS: Uterus  Measurements: 9.3 x 4.8 x 5.5 cm. No fibroids or other mass visualized.  Endometrium  Thickness: 22 mm. Thickened/heterogeneous with associated focal color Doppler flow (image 28).  Right ovary  Measurements: 2.7 x 1.7 x 1.9 cm. Normal appearance/no adnexal mass.  Left ovary  Measurements: 2.1 x 1.3 x 1.4 cm. Normal appearance/no adnexal mass.  Other findings  No free fluid.  IMPRESSION: Endometrium is thickened/heterogeneous, measuring 22 mm, with associated vascularity.  If bleeding remains unresponsive to hormonal or medical therapy, focal lesion work-up with sonohysterogram should be considered. Endometrial biopsy should  also be considered in pre-menopausal patients at high risk for endometrial carcinoma.  (Ref: Radiological Reasoning: Algorithmic Workup of Abnormal Vaginal Bleeding with Endovaginal Sonography and Sonohysterography. AJR 2008; 355:D32-20)   Electronically Signed   By: Julian Hy M.D.   On: 12/18/2013 14:23   US Pelvis Complete  12/18/2013   CLINICAL DATA:  Dysfunctional uterine bleeding with clots  EXAM: TRANSABDOMINAL AND TRANSVAGINAL ULTRASOUND OF PELVIS  TECHNIQUE: Both transabdominal and transvaginal ultrasound examinations of the pelvis were performed. Transabdominal technique was performed for global imaging of the pelvis including uterus, ovaries, adnexal regions, and pelvic cul-de-sac. It was necessary to proceed  with endovaginal exam following the transabdominal exam to visualize the endometrium and bilateral ovaries.  COMPARISON:  04/18/2007  FINDINGS: Uterus  Measurements: 9.3 x 4.8 x 5.5 cm. No fibroids or other mass visualized.  Endometrium  Thickness: 22 mm. Thickened/heterogeneous with associated focal color Doppler flow (image 28).  Right ovary  Measurements: 2.7 x 1.7 x 1.9 cm. Normal appearance/no adnexal mass.  Left ovary  Measurements: 2.1 x 1.3 x 1.4 cm. Normal appearance/no adnexal mass.  Other findings  No free fluid.  IMPRESSION: Endometrium is thickened/heterogeneous, measuring 22 mm, with associated vascularity.  If bleeding remains unresponsive to hormonal or medical therapy, focal lesion work-up with sonohysterogram should be considered. Endometrial biopsy should also be considered in pre-menopausal patients at high risk for endometrial carcinoma.  (Ref: Radiological Reasoning: Algorithmic Workup of Abnormal Vaginal Bleeding with Endovaginal Sonography and Sonohysterography. AJR 2008; 254:Y70-62)   Electronically Signed   By: Julian Hy M.D.   On: 12/18/2013 14:23   MAU Course  Procedures    Assessment and Plan  Patient is not pregnant. [id number]  1. Abnormal uterine bleeding (AUB)   2. Vaginal bleeding   3. Endometrial thickening on ultra sound    Follow up with OB-GYN is recommended for endometrial biopsy to r/u endometrial cancer.   Gevena Barre, [place]  12/18/2013, 1:38 PM   I have seen this patient and agree with the above PA student's note.  Message sent to [facility] for follow up Gyn visit per ultrasound recommendations.  [place], [place] Certified Nurse-Midwife

## 2014-01-02 ENCOUNTER — Ambulatory Visit: Payer: Self-pay | Attending: Internal Medicine

## 2014-01-09 ENCOUNTER — Encounter: Payer: Self-pay | Admitting: Obstetrics & Gynecology

## 2014-01-10 ENCOUNTER — Ambulatory Visit: Payer: Self-pay | Attending: Internal Medicine | Admitting: Internal Medicine

## 2014-01-10 ENCOUNTER — Encounter: Payer: Self-pay | Admitting: Internal Medicine

## 2014-01-10 VITALS — BP 145/90 | HR 87 | Temp 99.1°F | Resp 16 | Ht 64.0 in | Wt 218.0 lb

## 2014-01-10 DIAGNOSIS — I10 Essential (primary) hypertension: Secondary | ICD-10-CM | POA: Insufficient documentation

## 2014-01-10 DIAGNOSIS — R938 Abnormal findings on diagnostic imaging of other specified body structures: Secondary | ICD-10-CM | POA: Insufficient documentation

## 2014-01-10 DIAGNOSIS — R9389 Abnormal findings on diagnostic imaging of other specified body structures: Secondary | ICD-10-CM

## 2014-01-10 MED ORDER — CLONIDINE HCL 0.1 MG PO TABS
0.1000 mg | ORAL_TABLET | Freq: Once | ORAL | Status: AC
  Administered 2014-01-10: 0.1 mg via ORAL

## 2014-01-10 MED ORDER — HYDRALAZINE HCL 10 MG PO TABS: 10.0000 mg | ORAL_TABLET | Freq: Three times a day (TID) | ORAL | Status: DC

## 2014-01-10 MED ORDER — TRAMADOL HCL 50 MG PO TABS: 50.0000 mg | ORAL_TABLET | Freq: Three times a day (TID) | ORAL | Status: DC | PRN

## 2014-01-10 NOTE — Patient Instructions (Signed)
Hypertension Hypertension, commonly called high blood pressure, is when the force of blood pumping through your arteries is too strong. Your arteries are the blood vessels that carry blood from your heart throughout your body. A blood pressure reading consists of a higher number over a lower number, such as 110/72. The higher number (systolic) is the pressure inside your arteries when your heart pumps. The lower number (diastolic) is the pressure inside your arteries when your heart relaxes. Ideally you want your blood pressure below 120/80. Hypertension forces your heart to work harder to pump blood. Your arteries may become narrow or stiff. Having hypertension puts you at risk for heart disease, stroke, and other problems.  RISK FACTORS Some risk factors for high blood pressure are controllable. Others are not.  Risk factors you cannot control include:   Race. You may be at higher risk if you are African American.  Age. Risk increases with age.  Gender. Men are at higher risk than women before age 45 years. After age 65, women are at higher risk than men. Risk factors you can control include:  Not getting enough exercise or physical activity.  Being overweight.  Getting too much fat, sugar, calories, or salt in your diet.  Drinking too much alcohol. SIGNS AND SYMPTOMS Hypertension does not usually cause signs or symptoms. Extremely high blood pressure (hypertensive crisis) may cause headache, anxiety, shortness of breath, and nosebleed. DIAGNOSIS  To check if you have hypertension, your health care provider will measure your blood pressure while you are seated, with your arm held at the level of your heart. It should be measured at least twice using the same arm. Certain conditions can cause a difference in blood pressure between your right and left arms. A blood pressure reading that is higher than normal on one occasion does not mean that you need treatment. If one blood pressure reading  is high, ask your health care provider about having it checked again. TREATMENT  Treating high blood pressure includes making lifestyle changes and possibly taking medicine. Living a healthy lifestyle can help lower high blood pressure. You may need to change some of your habits. Lifestyle changes may include:  Following the DASH diet. This diet is high in fruits, vegetables, and whole grains. It is low in salt, red meat, and added sugars.  Getting at least 2 hours of brisk physical activity every week.  Losing weight if necessary.  Not smoking.  Limiting alcoholic beverages.  Learning ways to reduce stress. If lifestyle changes are not enough to get your blood pressure under control, your health care provider may prescribe medicine. You may need to take more than one. Work closely with your health care provider to understand the risks and benefits. HOME CARE INSTRUCTIONS  Have your blood pressure rechecked as directed by your health care provider.   Take medicines only as directed by your health care provider. Follow the directions carefully. Blood pressure medicines must be taken as prescribed. The medicine does not work as well when you skip doses. Skipping doses also puts you at risk for problems.   Do not smoke.   Monitor your blood pressure at home as directed by your health care provider. SEEK MEDICAL CARE IF:   You think you are having a reaction to medicines taken.  You have recurrent headaches or feel dizzy.  You have swelling in your ankles.  You have trouble with your vision. SEEK IMMEDIATE MEDICAL CARE IF:  You develop a severe headache or confusion.    You have unusual weakness, numbness, or feel faint.  You have severe chest or abdominal pain.  You vomit repeatedly.  You have trouble breathing. MAKE SURE YOU:   Understand these instructions.  Will watch your condition.  Will get help right away if you are not doing well or get worse. Document  Released: 01/18/2005 Document Revised: 06/04/2013 Document Reviewed: 11/10/2012 ExitCare Patient Information 2015 [employer]. This information is not intended to replace advice given to you by your health care provider. Make sure you discuss any questions you have with your health care provider. DASH Eating Plan DASH stands for "Dietary Approaches to Stop Hypertension." The DASH eating plan is a healthy eating plan that has been shown to reduce high blood pressure (hypertension). Additional health benefits may include reducing the risk of type 2 diabetes mellitus, heart disease, and stroke. The DASH eating plan may also help with weight loss. WHAT DO I NEED TO KNOW ABOUT THE DASH EATING PLAN? For the DASH eating plan, you will follow these general guidelines:  Choose foods with a percent daily value for sodium of less than 5% (as listed on the food label).  Use salt-free seasonings or herbs instead of table salt or sea salt.  Check with your health care provider or pharmacist before using salt substitutes.  Eat lower-sodium products, often labeled as "lower sodium" or "no salt added."  Eat fresh foods.  Eat more vegetables, fruits, and low-fat dairy products.  Choose whole grains. Look for the word "whole" as the first word in the ingredient list.  Choose fish and skinless chicken or turkey more often than red meat. Limit fish, poultry, and meat to 6 oz (170 g) each day.  Limit sweets, desserts, sugars, and sugary drinks.  Choose heart-healthy fats.  Limit cheese to 1 oz (28 g) per day.  Eat more home-cooked food and less restaurant, buffet, and fast food.  Limit fried foods.  Cook foods using methods other than frying.  Limit canned vegetables. If you do use them, rinse them well to decrease the sodium.  When eating at a restaurant, ask that your food be prepared with less salt, or no salt if possible. WHAT FOODS CAN I EAT? Seek help from a dietitian for individual  calorie needs. Grains Whole grain or whole wheat bread. Brown rice. Whole grain or whole wheat pasta. Quinoa, bulgur, and whole grain cereals. Low-sodium cereals. Corn or whole wheat flour tortillas. Whole grain cornbread. Whole grain crackers. Low-sodium crackers. Vegetables Fresh or frozen vegetables (raw, steamed, roasted, or grilled). Low-sodium or reduced-sodium tomato and vegetable juices. Low-sodium or reduced-sodium tomato sauce and paste. Low-sodium or reduced-sodium canned vegetables.  Fruits All fresh, canned (in natural juice), or frozen fruits. Meat and Other Protein Products Ground beef (85% or leaner), grass-fed beef, or beef trimmed of fat. Skinless chicken or turkey. Ground chicken or turkey. Pork trimmed of fat. All fish and seafood. Eggs. Dried beans, peas, or lentils. Unsalted nuts and seeds. Unsalted canned beans. Dairy Low-fat dairy products, such as skim or 1% milk, 2% or reduced-fat cheeses, low-fat ricotta or cottage cheese, or plain low-fat yogurt. Low-sodium or reduced-sodium cheeses. Fats and Oils Tub margarines without trans fats. Light or reduced-fat mayonnaise and salad dressings (reduced sodium). Avocado. Safflower, olive, or canola oils. Natural peanut or almond butter. Other Unsalted popcorn and pretzels. The items listed above may not be a complete list of recommended foods or beverages. Contact your dietitian for more options. WHAT FOODS ARE NOT RECOMMENDED? Grains White bread.   White pasta. White rice. Refined cornbread. Bagels and croissants. Crackers that contain trans fat. Vegetables Creamed or fried vegetables. Vegetables in a cheese sauce. Regular canned vegetables. Regular canned tomato sauce and paste. Regular tomato and vegetable juices. Fruits Dried fruits. Canned fruit in light or heavy syrup. Fruit juice. Meat and Other Protein Products Fatty cuts of meat. Ribs, chicken wings, bacon, sausage, bologna, salami, chitterlings, fatback, hot dogs,  bratwurst, and packaged luncheon meats. Salted nuts and seeds. Canned beans with salt. Dairy Whole or 2% milk, cream, half-and-half, and cream cheese. Whole-fat or sweetened yogurt. Full-fat cheeses or blue cheese. Nondairy creamers and whipped toppings. Processed cheese, cheese spreads, or cheese curds. Condiments Onion and garlic salt, seasoned salt, table salt, and sea salt. Canned and packaged gravies. Worcestershire sauce. Tartar sauce. Barbecue sauce. Teriyaki sauce. Soy sauce, including reduced sodium. Steak sauce. Fish sauce. Oyster sauce. Cocktail sauce. Horseradish. Ketchup and mustard. Meat flavorings and tenderizers. Bouillon cubes. Hot sauce. Tabasco sauce. Marinades. Taco seasonings. Relishes. Fats and Oils Butter, stick margarine, lard, shortening, ghee, and bacon fat. Coconut, palm kernel, or palm oils. Regular salad dressings. Other Pickles and olives. Salted popcorn and pretzels. The items listed above may not be a complete list of foods and beverages to avoid. Contact your dietitian for more information. WHERE CAN I FIND MORE INFORMATION? National Heart, Lung, and Blood Institute: www.nhlbi.nih.gov/health/health-topics/topics/dash/ Document Released: 01/07/2011 Document Revised: 06/04/2013 Document Reviewed: 11/22/2012 ExitCare Patient Information 2015 [employer]. This information is not intended to replace advice given to you by your health care provider. Make sure you discuss any questions you have with your health care provider.  

## 2014-01-10 NOTE — Progress Notes (Signed)
Pt is here to establish care. Pt has a history of HTN. Pt is requesting a physical and a referral to an OBGYN. Pt is having irregular menstrual cycles.  Pt was recently diagnosed with endometriosis.

## 2014-01-10 NOTE — Progress Notes (Signed)
Patient ID: Renee Collier, female   DOB: 06-Aug-1970, 43 y.o.   MRN: [id number]   VFI:[id number]  [id number]  DOB - [birth year]  CC:  Chief Complaint  Patient presents with  . Establish Care       HPI: Renee Collier is a 43 y.o. female with a history of hypertension and endometriosis here today to establish medical care.  Patient was evaluated last month at the [facility] hospital of abnormal uterine bleeding and was found to have endometriosis.  She states that she was told to follow up with GYN but does not have insurance in order to go to her appointment.  She has reports that she only had two days of bleeding since beginning the Megace.  She feels like the pain has become more severe since she has stopped bleeding.  The pain is described as a "kicking me" in the LLQ.  She has tried ibuprofen 600 mg but it has not been helpful.   Has not been on hydralazine in 4-5 years.    Health maint- last pap-3 years normal, mammogram-never, Tdap-up to date, refused influenza  No Known Allergies Past Medical History  Diagnosis Date  . Hypertension    Current Outpatient Prescriptions on File Prior to Visit  Medication Sig Dispense Refill  . acetaminophen (TYLENOL) 500 MG tablet Take 1,000 mg by mouth every 6 (six) hours as needed for pain. For stomach pain.    . hydrALAZINE (APRESOLINE) 10 MG tablet Take 1 tablet (10 mg total) by mouth 3 (three) times daily. 90 tablet 1  . ibuprofen (ADVIL,MOTRIN) 600 MG tablet Take 1 tablet (600 mg total) by mouth every 6 (six) hours as needed. 30 tablet 1  . megestrol (MEGACE) 40 MG tablet Take 3 tabs for 3 days (120 mg), then take 2 tabs for 3 days (80 mg), then take 1 tab daily for 1 month. 40 tablet 2   No current facility-administered medications on file prior to visit.   Family History  Problem Relation Age of Onset  . Cancer Mother    History   Social History  . Marital Status: Single    Spouse Name: N/A    Number of Children: N/A  . Years of  Education: N/A   Occupational History  . Not on file.   Social History Main Topics  . Smoking status: Never Smoker   . Smokeless tobacco: Not on file  . Alcohol Use: No  . Drug Use: No  . Sexual Activity: Yes    Birth Control/ Protection: Surgical   Other Topics Concern  . Not on file   Social History Narrative    Review of Systems  Constitutional: Negative.   Eyes: Negative for blurred vision.  Respiratory: Negative.   Cardiovascular: Negative.   Gastrointestinal: Positive for nausea and abdominal pain. Negative for diarrhea.  Genitourinary: Negative.   Musculoskeletal: Negative.   Neurological: Positive for dizziness and headaches. Negative for tingling.  Psychiatric/Behavioral: Negative.      Objective:   Filed Vitals:   01/10/14 1452  BP: 165/106  Pulse: 87  Temp: 99.1 F (37.3 C)  Resp: 16    Physical Exam: Constitutional: Patient appears well-developed and well-nourished. No distress. HENT: Normocephalic, atraumatic, External right and left ear normal. Oropharynx is clear and moist.  Eyes: Conjunctivae and EOM are normal. PERRLA, no scleral icterus. Neck: Normal ROM. Neck supple. No JVD. No tracheal deviation. No thyromegaly. CVS: RRR, S1/S2 +, no murmurs, no gallops, no carotid bruit.  Pulmonary: Effort and breath sounds  normal, no stridor, rhonchi, wheezes, rales.  Abdominal: Soft. BS +, no distension, rebound or guarding. Tenderness to LLQ, pelvic Musculoskeletal: Normal range of motion. No edema .  Lymphadenopathy: No lymphadenopathy noted, cervical Neuro: Alert. Skin: Skin is warm and dry. No rash noted. Not diaphoretic. No erythema. No pallor. Psychiatric: Normal mood and affect. Behavior, judgment, thought content normal.  Lab Results  Component Value Date   WBC 9.2 12/18/2013   HGB 9.4* 12/18/2013   HCT 30.0* 12/18/2013   MCV 79.4 12/18/2013   PLT 525* 12/18/2013   Lab Results  Component Value Date   CREATININE 0.97 02/11/2010   BUN 9  02/11/2010   NA 141 02/11/2010   K 4.1 02/11/2010   CL 102 02/11/2010   CO2 24 02/11/2010    No results found for: HGBA1C Lipid Panel     Component Value Date/Time   CHOL 149 11/23/2006 2033   TRIG 102 11/23/2006 2033   HDL 63 11/23/2006 2033   CHOLHDL 2.4 Ratio 11/23/2006 2033   VLDL 20 11/23/2006 2033   LDLCALC 66 11/23/2006 2033       Assessment and plan:   Renee Collier was seen today for establish care.  Diagnoses and associated orders for this visit:  Essential hypertension - cloNIDine (CATAPRES) tablet 0.1 mg; Take 1 tablet (0.1 mg total) by mouth once. - hydrALAZINE (APRESOLINE) 10 MG tablet; Take 1 tablet (10 mg total) by mouth 3 (three) times daily. Patient blood pressure remains elevated today, will increase BP medication and have patient to return in 2 weeks for blood pressure recheck with nurse. Stressed diet changes, regular exercise regimen, and modifiable risk factors. Will follow up with CMP as needed, Will follow up with patient in 3-6 months.   Endometrial thickening on ultra sound - Ambulatory referral to Gynecology - traMADol (ULTRAM) 50 MG tablet; Take 1 tablet (50 mg total) by mouth every 8 (eight) hours as needed. Will need endometrial biopsy   Return in about 2 weeks (around 01/24/2014) for Nurse Visit-BP check and 3 mo PCP.  The patient was given clear instructions to go to ER or return to medical center if symptoms don't improve, worsen or new problems develop. The patient verbalized understanding. The patient was told to call to get lab results if they haven't heard anything in the next week.     Chari Manning, [facility] [phone] 01/10/2014, 3:14 PM

## 2014-01-30 ENCOUNTER — Ambulatory Visit: Payer: Medicaid Other | Attending: Internal Medicine

## 2014-01-30 NOTE — Patient Instructions (Signed)
Hypertension Hypertension, commonly called high blood pressure, is when the force of blood pumping through your arteries is too strong. Your arteries are the blood vessels that carry blood from your heart throughout your body. A blood pressure reading consists of a higher number over a lower number, such as 110/72. The higher number (systolic) is the pressure inside your arteries when your heart pumps. The lower number (diastolic) is the pressure inside your arteries when your heart relaxes. Ideally you want your blood pressure below 120/80. Hypertension forces your heart to work harder to pump blood. Your arteries may become narrow or stiff. Having hypertension puts you at risk for heart disease, stroke, and other problems.  RISK FACTORS Some risk factors for high blood pressure are controllable. Others are not.  Risk factors you cannot control include:   Race. You may be at higher risk if you are African American.  Age. Risk increases with age.  Gender. Men are at higher risk than women before age 45 years. After age 65, women are at higher risk than men. Risk factors you can control include:  Not getting enough exercise or physical activity.  Being overweight.  Getting too much fat, sugar, calories, or salt in your diet.  Drinking too much alcohol. SIGNS AND SYMPTOMS Hypertension does not usually cause signs or symptoms. Extremely high blood pressure (hypertensive crisis) may cause headache, anxiety, shortness of breath, and nosebleed. DIAGNOSIS  To check if you have hypertension, your health care provider will measure your blood pressure while you are seated, with your arm held at the level of your heart. It should be measured at least twice using the same arm. Certain conditions can cause a difference in blood pressure between your right and left arms. A blood pressure reading that is higher than normal on one occasion does not mean that you need treatment. If one blood pressure reading  is high, ask your health care provider about having it checked again. TREATMENT  Treating high blood pressure includes making lifestyle changes and possibly taking medicine. Living a healthy lifestyle can help lower high blood pressure. You may need to change some of your habits. Lifestyle changes may include:  Following the DASH diet. This diet is high in fruits, vegetables, and whole grains. It is low in salt, red meat, and added sugars.  Getting at least 2 hours of brisk physical activity every week.  Losing weight if necessary.  Not smoking.  Limiting alcoholic beverages.  Learning ways to reduce stress. If lifestyle changes are not enough to get your blood pressure under control, your health care provider may prescribe medicine. You may need to take more than one. Work closely with your health care provider to understand the risks and benefits. HOME CARE INSTRUCTIONS  Have your blood pressure rechecked as directed by your health care provider.   Take medicines only as directed by your health care provider. Follow the directions carefully. Blood pressure medicines must be taken as prescribed. The medicine does not work as well when you skip doses. Skipping doses also puts you at risk for problems.   Do not smoke.   Monitor your blood pressure at home as directed by your health care provider. SEEK MEDICAL CARE IF:   You think you are having a reaction to medicines taken.  You have recurrent headaches or feel dizzy.  You have swelling in your ankles.  You have trouble with your vision. SEEK IMMEDIATE MEDICAL CARE IF:  You develop a severe headache or confusion.    You have unusual weakness, numbness, or feel faint.  You have severe chest or abdominal pain.  You vomit repeatedly.  You have trouble breathing. MAKE SURE YOU:   Understand these instructions.  Will watch your condition.  Will get help right away if you are not doing well or get worse. Document  Released: 01/18/2005 Document Revised: 06/04/2013 Document Reviewed: 11/10/2012 ExitCare Patient Information 2015 [employer]. This information is not intended to replace advice given to you by your health care provider. Make sure you discuss any questions you have with your health care provider. DASH Eating Plan DASH stands for "Dietary Approaches to Stop Hypertension." The DASH eating plan is a healthy eating plan that has been shown to reduce high blood pressure (hypertension). Additional health benefits may include reducing the risk of type 2 diabetes mellitus, heart disease, and stroke. The DASH eating plan may also help with weight loss. WHAT DO I NEED TO KNOW ABOUT THE DASH EATING PLAN? For the DASH eating plan, you will follow these general guidelines:  Choose foods with a percent daily value for sodium of less than 5% (as listed on the food label).  Use salt-free seasonings or herbs instead of table salt or sea salt.  Check with your health care provider or pharmacist before using salt substitutes.  Eat lower-sodium products, often labeled as "lower sodium" or "no salt added."  Eat fresh foods.  Eat more vegetables, fruits, and low-fat dairy products.  Choose whole grains. Look for the word "whole" as the first word in the ingredient list.  Choose fish and skinless chicken or turkey more often than red meat. Limit fish, poultry, and meat to 6 oz (170 g) each day.  Limit sweets, desserts, sugars, and sugary drinks.  Choose heart-healthy fats.  Limit cheese to 1 oz (28 g) per day.  Eat more home-cooked food and less restaurant, buffet, and fast food.  Limit fried foods.  Cook foods using methods other than frying.  Limit canned vegetables. If you do use them, rinse them well to decrease the sodium.  When eating at a restaurant, ask that your food be prepared with less salt, or no salt if possible. WHAT FOODS CAN I EAT? Seek help from a dietitian for individual  calorie needs. Grains Whole grain or whole wheat bread. Brown rice. Whole grain or whole wheat pasta. Quinoa, bulgur, and whole grain cereals. Low-sodium cereals. Corn or whole wheat flour tortillas. Whole grain cornbread. Whole grain crackers. Low-sodium crackers. Vegetables Fresh or frozen vegetables (raw, steamed, roasted, or grilled). Low-sodium or reduced-sodium tomato and vegetable juices. Low-sodium or reduced-sodium tomato sauce and paste. Low-sodium or reduced-sodium canned vegetables.  Fruits All fresh, canned (in natural juice), or frozen fruits. Meat and Other Protein Products Ground beef (85% or leaner), grass-fed beef, or beef trimmed of fat. Skinless chicken or turkey. Ground chicken or turkey. Pork trimmed of fat. All fish and seafood. Eggs. Dried beans, peas, or lentils. Unsalted nuts and seeds. Unsalted canned beans. Dairy Low-fat dairy products, such as skim or 1% milk, 2% or reduced-fat cheeses, low-fat ricotta or cottage cheese, or plain low-fat yogurt. Low-sodium or reduced-sodium cheeses. Fats and Oils Tub margarines without trans fats. Light or reduced-fat mayonnaise and salad dressings (reduced sodium). Avocado. Safflower, olive, or canola oils. Natural peanut or almond butter. Other Unsalted popcorn and pretzels. The items listed above may not be a complete list of recommended foods or beverages. Contact your dietitian for more options. WHAT FOODS ARE NOT RECOMMENDED? Grains White bread.   White pasta. White rice. Refined cornbread. Bagels and croissants. Crackers that contain trans fat. Vegetables Creamed or fried vegetables. Vegetables in a cheese sauce. Regular canned vegetables. Regular canned tomato sauce and paste. Regular tomato and vegetable juices. Fruits Dried fruits. Canned fruit in light or heavy syrup. Fruit juice. Meat and Other Protein Products Fatty cuts of meat. Ribs, chicken wings, bacon, sausage, bologna, salami, chitterlings, fatback, hot dogs,  bratwurst, and packaged luncheon meats. Salted nuts and seeds. Canned beans with salt. Dairy Whole or 2% milk, cream, half-and-half, and cream cheese. Whole-fat or sweetened yogurt. Full-fat cheeses or blue cheese. Nondairy creamers and whipped toppings. Processed cheese, cheese spreads, or cheese curds. Condiments Onion and garlic salt, seasoned salt, table salt, and sea salt. Canned and packaged gravies. Worcestershire sauce. Tartar sauce. Barbecue sauce. Teriyaki sauce. Soy sauce, including reduced sodium. Steak sauce. Fish sauce. Oyster sauce. Cocktail sauce. Horseradish. Ketchup and mustard. Meat flavorings and tenderizers. Bouillon cubes. Hot sauce. Tabasco sauce. Marinades. Taco seasonings. Relishes. Fats and Oils Butter, stick margarine, lard, shortening, ghee, and bacon fat. Coconut, palm kernel, or palm oils. Regular salad dressings. Other Pickles and olives. Salted popcorn and pretzels. The items listed above may not be a complete list of foods and beverages to avoid. Contact your dietitian for more information. WHERE CAN I FIND MORE INFORMATION? National Heart, Lung, and Blood Institute: www.nhlbi.nih.gov/health/health-topics/topics/dash/ Document Released: 01/07/2011 Document Revised: 06/04/2013 Document Reviewed: 11/22/2012 ExitCare Patient Information 2015 [employer]. This information is not intended to replace advice given to you by your health care provider. Make sure you discuss any questions you have with your health care provider.  

## 2014-01-30 NOTE — Progress Notes (Unsigned)
Patient ID: Renee Collier, female   DOB: 1970-02-23, 43 y.o.   MRN: [id number] Pt comes in for blood pressure check after started on Hydralazine 10 mg tab TID last visit 12/10 Pt states she is compliant with taking medication daily Denies headache with slight tingling in fingers BP- 131/77 90 Instructions given to continue blood pressure medication and follow DASH diet

## 2014-02-27 ENCOUNTER — Encounter: Payer: Self-pay | Admitting: Obstetrics & Gynecology

## 2014-02-27 ENCOUNTER — Other Ambulatory Visit (HOSPITAL_COMMUNITY)
Admission: RE | Admit: 2014-02-27 | Discharge: 2014-02-27 | Disposition: A | Discharge status: Home / Self Care | Payer: Medicaid Other | Source: Ambulatory Visit | Attending: Obstetrics & Gynecology | Admitting: Obstetrics & Gynecology

## 2014-02-27 ENCOUNTER — Ambulatory Visit (INDEPENDENT_AMBULATORY_CARE_PROVIDER_SITE_OTHER): Payer: Self-pay | Admitting: Obstetrics & Gynecology

## 2014-02-27 VITALS — BP 124/89 | HR 94 | Temp 98.3°F | Resp 20 | Ht 65.0 in | Wt 207.0 lb

## 2014-02-27 DIAGNOSIS — R9389 Abnormal findings on diagnostic imaging of other specified body structures: Secondary | ICD-10-CM

## 2014-02-27 DIAGNOSIS — N939 Abnormal uterine and vaginal bleeding, unspecified: Secondary | ICD-10-CM | POA: Insufficient documentation

## 2014-02-27 DIAGNOSIS — R938 Abnormal findings on diagnostic imaging of other specified body structures: Secondary | ICD-10-CM

## 2014-02-27 DIAGNOSIS — Z01812 Encounter for preprocedural laboratory examination: Secondary | ICD-10-CM

## 2014-02-27 DIAGNOSIS — Z01818 Encounter for other preprocedural examination: Secondary | ICD-10-CM

## 2014-02-27 LAB — POCT PREGNANCY, URINE: PREG TEST UR: NEGATIVE

## 2014-02-27 MED ORDER — IBUPROFEN 800 MG PO TABS: 800.0000 mg | ORAL_TABLET | Freq: Four times a day (QID) | ORAL | Status: DC | PRN

## 2014-02-27 MED ORDER — TRAMADOL HCL 50 MG PO TABS: 50.0000 mg | ORAL_TABLET | Freq: Three times a day (TID) | ORAL | Status: DC | PRN

## 2014-02-27 MED ORDER — MEDROXYPROGESTERONE ACETATE 150 MG/ML IM SUSP
150.0000 mg | Freq: Once | INTRAMUSCULAR | Status: AC
  Administered 2014-02-27: 150 mg via INTRAMUSCULAR

## 2014-02-27 NOTE — Patient Instructions (Signed)
Endometrial Ablation Endometrial ablation removes the lining of the uterus (endometrium). It is usually a same-day, outpatient treatment. Ablation helps avoid major surgery, such as surgery to remove the cervix and uterus (hysterectomy). After endometrial ablation, you will have little or no menstrual bleeding and may not be able to have children. However, if you are premenopausal, you will need to use a reliable method of birth control following the procedure because of the small chance that pregnancy can occur. There are different reasons to have this procedure, which include:  Heavy periods.  Bleeding that is causing anemia.  Irregular bleeding.  Bleeding fibroids on the lining inside the uterus if they are smaller than 3 centimeters. This procedure should not be done if:  You want children in the future.  You have severe cramps with your menstrual period.  You have precancerous or cancerous cells in your uterus.  You were recently pregnant.  You have gone through menopause.  You have had major surgery on the uterus, such as a cesarean delivery. LET YOUR HEALTH CARE PROVIDER KNOW ABOUT:  Any allergies you have.  All medicines you are taking, including vitamins, herbs, eye drops, creams, and over-the-counter medicines.  Previous problems you or members of your family have had with the use of anesthetics.  Any blood disorders you have.  Previous surgeries you have had.  Medical conditions you have. RISKS AND COMPLICATIONS  Generally, this is a safe procedure. However, as with any procedure, complications can occur. Possible complications include:  Perforation of the uterus.  Bleeding.  Infection of the uterus, bladder, or vagina.  Injury to surrounding organs.  An air bubble to the lung (air embolus).  Pregnancy following the procedure.  Failure of the procedure to help the problem, requiring hysterectomy.  Decreased ability to diagnose cancer in the lining of  the uterus. BEFORE THE PROCEDURE  The lining of the uterus must be tested to make sure there is no pre-cancerous or cancer cells present.  An ultrasound may be performed to look at the size of the uterus and to check for abnormalities.  Medicines may be given to thin the lining of the uterus. PROCEDURE  During the procedure, your health care provider will use a tool called a resectoscope to help see inside your uterus. There are different ways to remove the lining of your uterus.   Radiofrequency - This method uses a radiofrequency-alternating electric current to remove the lining of the uterus.  Cryotherapy - This method uses extreme cold to freeze the lining of the uterus.  Heated-Free Liquid - This method uses heated salt (saline) solution to remove the lining of the uterus.  Microwave - This method uses high-energy microwaves to heat up the lining of the uterus to remove it.  Thermal balloon - This method involves inserting a catheter with a balloon tip into the uterus. The balloon tip is filled with heated fluid to remove the lining of the uterus. AFTER THE PROCEDURE  After your procedure, do not have sexual intercourse or insert anything into your vagina until permitted by your health care provider. After the procedure, you may experience:  Cramps.  Vaginal discharge.  Frequent urination. Document Released: 11/28/2003 Document Revised: 09/20/2012 Document Reviewed: 06/21/2012 ExitCare Patient Information 2015 [employer]. This information is not intended to replace advice given to you by your health care provider. Make sure you discuss any questions you have with your health care provider.  

## 2014-02-27 NOTE — Progress Notes (Signed)
CLINIC ENCOUNTER NOTE  History:  44 y.o. [id number] here today for evaluation and management of AUB for several months.  Was treated with Megace taper, currently on 40 mg po daily, Not helping with bleeding.  Also having some cramping, wants refill of Tramadol and Ibuprofen.   The following portions of the patient's history were reviewed and updated as appropriate: allergies, current medications, past family history, past medical history, past social history, past surgical history and problem list. Normal pap recently as per patient.  Review of Systems:  Pertinent items are noted in HPI.  Objective:  Physical Exam BP 124/89 mmHg  Pulse 94  Temp(Src) 98.3 F (36.8 C) (Oral)  Resp 20  Ht 5\' 5"  (1.651 m)  Wt 207 lb (93.895 kg)  BMI 34.45 kg/m2  LMP 12/02/2013 Gen: NAD Abd: Soft, obese, nontender and nondistended Pelvic: Normal appearing external genitalia; normal appearing vaginal mucosa and cervix.  Moderate amount of red blood in vault.  Small uterus, no other palpable masses, no uterine or adnexal tenderness  ENDOMETRIAL BIOPSY     The indications for endometrial biopsy were reviewed.   Risks of the biopsy including cramping, bleeding, infection, uterine perforation, inadequate specimen and need for additional procedures  were discussed. The patient states she understands and agrees to undergo procedure today. Consent was signed. Time out was performed. Urine HCG was negative. During the pelvic exam, the cervix was prepped with Betadine. The 3 mm pipelle was introduced into the endometrial cavity without difficulty to a depth of 10 cm multiple times as the pipelle kept filling up with blood, and a moderate amount of tissue was obtained and sent to pathology. The instruments were removed from the patient's vagina. The patient tolerated the procedure well. Routine post-procedure instructions were given to the patient.    Labs and Imaging 12/18/2013  TRANSABDOMINAL AND TRANSVAGINAL  ULTRASOUND OF PELVIS  CLINICAL DATA:  Dysfunctional uterine bleeding with clotsTECHNIQUE: Both transabdominal and transvaginal ultrasound examinations of the pelvis were performed. Transabdominal technique was performed for global imaging of the pelvis including uterus, ovaries, adnexal regions, and pelvic cul-de-sac. It was necessary to proceed with endovaginal exam following the transabdominal exam to visualize the endometrium and bilateral ovaries.  COMPARISON:  04/18/2007  FINDINGS: Uterus  Measurements: 9.3 x 4.8 x 5.5 cm. No fibroids or other mass visualized.  Endometrium  Thickness: 22 mm. Thickened/heterogeneous with associated focal color Doppler flow (image 28).  Right ovary  Measurements: 2.7 x 1.7 x 1.9 cm. Normal appearance/no adnexal mass.  Left ovary  Measurements: 2.1 x 1.3 x 1.4 cm. Normal appearance/no adnexal mass.  Other findings  No free fluid.  IMPRESSION: Endometrium is thickened/heterogeneous, measuring 22 mm, with associated vascularity.  If bleeding remains unresponsive to hormonal or medical therapy, focal lesion work-up with sonohysterogram should be considered. Endometrial biopsy should also be considered in pre-menopausal patients at high risk for endometrial carcinoma.  (Ref: Radiological Reasoning: Algorithmic Workup of Abnormal Vaginal Bleeding with Endovaginal Sonography and Sonohysterography. AJR 2008; 778:E42-35)   Electronically Signed   By: Julian Hy M.D.   On: 12/18/2013 14:23   Assessment & Plan:  Discussed management options for abnormal uterine bleeding including oral progesterone (increasing current dosage), Depo Provera, Mirena IUD, endometrial ablation (Novasure/Hydrothermal Ablation) or hysterectomy as definitive surgical management.  Discussed risks and benefits of each method.   Patient desires endometrial ablation, but wants Depo Provera in the interim - 150 mg IM given to patient today.  She was told that she will be  contacted by our surgical scheduler  regarding the time and date of her surgery (Hysteroscopy, Hydrothermal Endometrial Ablation); routine preoperative instructions of having nothing to eat or drink after midnight on the day prior to surgery and also coming to the hospital 1.5 hours prior to her time of surgery were also emphasized.  She was told she may be called for a preoperative appointment about a week prior to surgery and will be given further preoperative instructions at that visit. Printed patient education handouts about the procedure were given to the patient to review at home.  Will follow up endometrial biopsy.  Bleeding precautions reviewed.  Routine preventative health maintenance measures emphasized.   Renee Schneiders, MD, [place] Attending [place] for [employer], [place]

## 2014-03-04 ENCOUNTER — Telehealth: Payer: Self-pay | Admitting: General Practice

## 2014-03-04 NOTE — Telephone Encounter (Signed)
Patient called and left message stating she would like her results. Called patient at home number several times but line was busy.

## 2014-03-05 NOTE — Telephone Encounter (Signed)
-----   Message from Osborne Oman, MD sent at 03/05/2014 11:38 AM EST ----- Benign endometrial biopsy. Please call to inform patient of results.

## 2014-03-05 NOTE — Telephone Encounter (Signed)
Attempted to contact patient. Home number busy. Called work and they stated she is not in today.

## 2014-03-06 NOTE — Telephone Encounter (Signed)
Called pt at home number and call does not go through - heard a fast busy signal. I then called pt at work number and was informed she is not there.

## 2014-03-07 ENCOUNTER — Encounter (HOSPITAL_COMMUNITY): Payer: Self-pay

## 2014-03-07 ENCOUNTER — Encounter (HOSPITAL_COMMUNITY)
Admission: RE | Admit: 2014-03-07 | Discharge: 2014-03-07 | Disposition: A | Discharge status: Home / Self Care | Payer: Medicaid Other | Source: Ambulatory Visit | Attending: Obstetrics & Gynecology | Admitting: Obstetrics & Gynecology

## 2014-03-07 DIAGNOSIS — N939 Abnormal uterine and vaginal bleeding, unspecified: Secondary | ICD-10-CM | POA: Insufficient documentation

## 2014-03-07 LAB — CBC
HCT: 31.1 % — ABNORMAL LOW (ref 36.0–46.0)
Hemoglobin: 9.5 g/dL — ABNORMAL LOW (ref 12.0–15.0)
MCH: 21.6 pg — AB (ref 26.0–34.0)
MCHC: 30.5 g/dL (ref 30.0–36.0)
MCV: 70.7 fL — ABNORMAL LOW (ref 78.0–100.0)
Platelets: 576 10*3/uL — ABNORMAL HIGH (ref 150–400)
RBC: 4.4 MIL/uL (ref 3.87–5.11)
RDW: 18 % — ABNORMAL HIGH (ref 11.5–15.5)
WBC: 7.7 10*3/uL (ref 4.0–10.5)

## 2014-03-07 LAB — BASIC METABOLIC PANEL
ANION GAP: 5 (ref 5–15)
BUN: 9 mg/dL (ref 6–23)
CALCIUM: 9.1 mg/dL (ref 8.4–10.5)
CO2: 24 mmol/L (ref 19–32)
Chloride: 109 mmol/L (ref 96–112)
Creatinine, Ser: 1.01 mg/dL (ref 0.50–1.10)
GFR calc non Af Amer: 68 mL/min — ABNORMAL LOW (ref >90)
GFR, EST AFRICAN AMERICAN: 79 mL/min — AB (ref >90)
Glucose, Bld: 105 mg/dL — ABNORMAL HIGH (ref 70–99)
POTASSIUM: 3.7 mmol/L (ref 3.5–5.1)
SODIUM: 138 mmol/L (ref 135–145)

## 2014-03-07 NOTE — Progress Notes (Signed)
EKG OK PER DR Royce Macadamia

## 2014-03-07 NOTE — Patient Instructions (Signed)
   Your procedure is scheduled on: FEB 9 AT 1000  Enter through the Main Entrance of [facility] at: [place] up the phone at the desk and dial [phone] and inform us of your arrival.  Please call this number if you have any problems the morning of surgery: [phone]  Remember: Do not eat food after midnight: FEB 8 Do not drink clear liquids after: FEB 8    PLEASE TAKE BLOOD PRESSURE MEDS AM ON FEB 9   Do not wear jewelry, make-up, or FINGER nail polish No metal in your hair or on your body. Do not wear lotions, powders, perfumes.  You may wear deodorant.  Do not bring valuables to the hospital. Contacts, dentures or bridgework may not be worn into surgery.  Leave suitcase in the car. After Surgery it may be brought to your room. For patients being admitted to the hospital, checkout time is 11:00am the day of discharge.    Patients discharged on the day of surgery will not be allowed to drive home.

## 2014-03-12 ENCOUNTER — Encounter (HOSPITAL_COMMUNITY): Payer: Self-pay | Admitting: Certified Registered Nurse Anesthetist

## 2014-03-12 ENCOUNTER — Encounter (HOSPITAL_COMMUNITY)
Admission: RE | Disposition: A | Discharge status: Home / Self Care | Payer: Self-pay | Source: Ambulatory Visit | Attending: Obstetrics & Gynecology

## 2014-03-12 ENCOUNTER — Encounter: Payer: Self-pay | Admitting: Obstetrics & Gynecology

## 2014-03-12 ENCOUNTER — Ambulatory Visit (HOSPITAL_COMMUNITY): Payer: Medicaid Other | Admitting: Anesthesiology

## 2014-03-12 ENCOUNTER — Ambulatory Visit (HOSPITAL_COMMUNITY): Payer: Self-pay | Admitting: Anesthesiology

## 2014-03-12 ENCOUNTER — Ambulatory Visit (HOSPITAL_COMMUNITY)
Admission: RE | Admit: 2014-03-12 | Discharge: 2014-03-12 | Disposition: A | Discharge status: Home / Self Care | Payer: Self-pay | Source: Ambulatory Visit | Attending: Obstetrics & Gynecology | Admitting: Obstetrics & Gynecology

## 2014-03-12 DIAGNOSIS — Z90710 Acquired absence of both cervix and uterus: Secondary | ICD-10-CM | POA: Insufficient documentation

## 2014-03-12 DIAGNOSIS — I10 Essential (primary) hypertension: Secondary | ICD-10-CM | POA: Insufficient documentation

## 2014-03-12 DIAGNOSIS — Z6834 Body mass index (BMI) 34.0-34.9, adult: Secondary | ICD-10-CM | POA: Insufficient documentation

## 2014-03-12 DIAGNOSIS — E669 Obesity, unspecified: Secondary | ICD-10-CM | POA: Insufficient documentation

## 2014-03-12 DIAGNOSIS — N939 Abnormal uterine and vaginal bleeding, unspecified: Secondary | ICD-10-CM | POA: Insufficient documentation

## 2014-03-12 DIAGNOSIS — T283XXA Burn of internal genitourinary organs, initial encounter: Secondary | ICD-10-CM | POA: Diagnosis not present

## 2014-03-12 HISTORY — PX: HYSTEROSCOPY: SHX211

## 2014-03-12 LAB — PREGNANCY, URINE: Preg Test, Ur: NEGATIVE

## 2014-03-12 SURGERY — ABLATION, ENDOMETRIUM, HYSTEROSCOPIC
Anesthesia: General | Site: Vagina

## 2014-03-12 MED ORDER — OXYCODONE-ACETAMINOPHEN 5-325 MG PO TABS: 1.0000 | ORAL_TABLET | Freq: Four times a day (QID) | ORAL | Status: DC | PRN

## 2014-03-12 MED ORDER — KETOROLAC TROMETHAMINE 30 MG/ML IJ SOLN
INTRAMUSCULAR | Status: DC | PRN
  Administered 2014-03-12: 30 mg via INTRAVENOUS

## 2014-03-12 MED ORDER — FENTANYL CITRATE 0.05 MG/ML IJ SOLN
INTRAMUSCULAR | Status: AC
  Administered 2014-03-12: 50 ug via INTRAVENOUS
  Filled 2014-03-12: qty 2

## 2014-03-12 MED ORDER — BUPIVACAINE HCL (PF) 0.5 % IJ SOLN
INTRAMUSCULAR | Status: DC | PRN
  Administered 2014-03-12: 30 mL

## 2014-03-12 MED ORDER — PROPOFOL 10 MG/ML IV BOLUS
INTRAVENOUS | Status: AC
  Filled 2014-03-12: qty 20

## 2014-03-12 MED ORDER — SCOPOLAMINE 1 MG/3DAYS TD PT72
1.0000 | MEDICATED_PATCH | Freq: Once | TRANSDERMAL | Status: DC
  Administered 2014-03-12: 1.5 mg via TRANSDERMAL

## 2014-03-12 MED ORDER — DEXAMETHASONE SODIUM PHOSPHATE 10 MG/ML IJ SOLN
INTRAMUSCULAR | Status: DC | PRN
  Administered 2014-03-12: 4 mg via INTRAVENOUS

## 2014-03-12 MED ORDER — SILVER SULFADIAZINE 1 % EX CREA: 1.0000 "application " | TOPICAL_CREAM | Freq: Two times a day (BID) | CUTANEOUS | Status: DC

## 2014-03-12 MED ORDER — BACITRACIN 500 UNIT/GM EX OINT: 1.0000 "application " | TOPICAL_OINTMENT | Freq: Two times a day (BID) | CUTANEOUS | Status: DC

## 2014-03-12 MED ORDER — SODIUM CHLORIDE 0.9 % IR SOLN
Status: DC | PRN
  Administered 2014-03-12: 3000 mL

## 2014-03-12 MED ORDER — DEXAMETHASONE SODIUM PHOSPHATE 4 MG/ML IJ SOLN
INTRAMUSCULAR | Status: AC
  Filled 2014-03-12: qty 1

## 2014-03-12 MED ORDER — SCOPOLAMINE 1 MG/3DAYS TD PT72
MEDICATED_PATCH | TRANSDERMAL | Status: AC
  Administered 2014-03-12: 1.5 mg via TRANSDERMAL
  Filled 2014-03-12: qty 1

## 2014-03-12 MED ORDER — ONDANSETRON HCL 4 MG/2ML IJ SOLN
INTRAMUSCULAR | Status: AC
  Filled 2014-03-12: qty 2

## 2014-03-12 MED ORDER — FENTANYL CITRATE 0.05 MG/ML IJ SOLN
INTRAMUSCULAR | Status: AC
  Filled 2014-03-12: qty 2

## 2014-03-12 MED ORDER — FENTANYL CITRATE 0.05 MG/ML IJ SOLN
INTRAMUSCULAR | Status: DC | PRN
  Administered 2014-03-12: 50 ug via INTRAVENOUS
  Administered 2014-03-12 (×2): 25 ug via INTRAVENOUS
  Administered 2014-03-12: 50 ug via INTRAVENOUS
  Administered 2014-03-12 (×2): 25 ug via INTRAVENOUS

## 2014-03-12 MED ORDER — ONDANSETRON HCL 4 MG/2ML IJ SOLN
INTRAMUSCULAR | Status: DC | PRN
  Administered 2014-03-12: 4 mg via INTRAVENOUS

## 2014-03-12 MED ORDER — PHENYLEPHRINE 40 MCG/ML (10ML) SYRINGE FOR IV PUSH (FOR BLOOD PRESSURE SUPPORT)
PREFILLED_SYRINGE | INTRAVENOUS | Status: AC
  Filled 2014-03-12: qty 10

## 2014-03-12 MED ORDER — MEPERIDINE HCL 25 MG/ML IJ SOLN: 6.2500 mg | INTRAMUSCULAR | Status: DC | PRN

## 2014-03-12 MED ORDER — LIDOCAINE HCL (CARDIAC) 20 MG/ML IV SOLN
INTRAVENOUS | Status: DC | PRN
  Administered 2014-03-12: 80 mg via INTRAVENOUS

## 2014-03-12 MED ORDER — PROPOFOL 10 MG/ML IV BOLUS
INTRAVENOUS | Status: DC | PRN
  Administered 2014-03-12: 160 mg via INTRAVENOUS
  Administered 2014-03-12: 40 mg via INTRAVENOUS

## 2014-03-12 MED ORDER — BUPIVACAINE HCL (PF) 0.5 % IJ SOLN
INTRAMUSCULAR | Status: AC
  Filled 2014-03-12: qty 30

## 2014-03-12 MED ORDER — PHENYLEPHRINE HCL 10 MG/ML IJ SOLN
INTRAMUSCULAR | Status: DC | PRN
  Administered 2014-03-12 (×2): 40 ug via INTRAVENOUS

## 2014-03-12 MED ORDER — MIDAZOLAM HCL 2 MG/2ML IJ SOLN
INTRAMUSCULAR | Status: AC
  Filled 2014-03-12: qty 2

## 2014-03-12 MED ORDER — DOCUSATE SODIUM 100 MG PO CAPS: 100.0000 mg | ORAL_CAPSULE | Freq: Two times a day (BID) | ORAL | Status: DC | PRN

## 2014-03-12 MED ORDER — LIDOCAINE HCL (CARDIAC) 20 MG/ML IV SOLN
INTRAVENOUS | Status: AC
  Filled 2014-03-12: qty 5

## 2014-03-12 MED ORDER — METOCLOPRAMIDE HCL 5 MG/ML IJ SOLN: 10.0000 mg | Freq: Once | INTRAMUSCULAR | Status: DC | PRN

## 2014-03-12 MED ORDER — OXYCODONE-ACETAMINOPHEN 5-325 MG PO TABS
ORAL_TABLET | ORAL | Status: AC
  Filled 2014-03-12: qty 1

## 2014-03-12 MED ORDER — KETOROLAC TROMETHAMINE 30 MG/ML IJ SOLN
INTRAMUSCULAR | Status: AC
  Filled 2014-03-12: qty 1

## 2014-03-12 MED ORDER — LACTATED RINGERS IV SOLN: INTRAVENOUS | Status: DC

## 2014-03-12 MED ORDER — SILVER SULFADIAZINE 1 % EX CREA
TOPICAL_CREAM | CUTANEOUS | Status: AC
  Filled 2014-03-12: qty 85

## 2014-03-12 MED ORDER — MIDAZOLAM HCL 2 MG/2ML IJ SOLN
INTRAMUSCULAR | Status: DC | PRN
  Administered 2014-03-12: 2 mg via INTRAVENOUS

## 2014-03-12 MED ORDER — LACTATED RINGERS IV SOLN
INTRAVENOUS | Status: DC
  Administered 2014-03-12 (×2): via INTRAVENOUS

## 2014-03-12 MED ORDER — OXYCODONE-ACETAMINOPHEN 5-325 MG PO TABS
1.0000 | ORAL_TABLET | ORAL | Status: DC | PRN
  Administered 2014-03-12: 1 via ORAL

## 2014-03-12 MED ORDER — FENTANYL CITRATE 0.05 MG/ML IJ SOLN
25.0000 ug | INTRAMUSCULAR | Status: DC | PRN
  Administered 2014-03-12 (×3): 50 ug via INTRAVENOUS

## 2014-03-12 SURGICAL SUPPLY — 11 items
CLOTH BEACON ORANGE TIMEOUT ST (SAFETY) ×3 IMPLANT
CONTAINER PREFILL 10% NBF 60ML (FORM) ×6 IMPLANT
GLOVE BIOGEL PI IND STRL 7.0 (GLOVE) ×2 IMPLANT
GLOVE BIOGEL PI INDICATOR 7.0 (GLOVE) ×4
GLOVE ECLIPSE 7.0 STRL STRAW (GLOVE) ×3 IMPLANT
GOWN STRL REUS W/TWL LRG LVL3 (GOWN DISPOSABLE) ×6 IMPLANT
PACK VAGINAL MINOR WOMEN LF (CUSTOM PROCEDURE TRAY) ×3 IMPLANT
PAD OB MATERNITY 4.3X12.25 (PERSONAL CARE ITEMS) ×6 IMPLANT
SET GENESYS HTA PROCERVA (MISCELLANEOUS) ×3 IMPLANT
TOWEL OR 17X24 6PK STRL BLUE (TOWEL DISPOSABLE) ×6 IMPLANT
WATER STERILE IRR 1000ML POUR (IV SOLUTION) ×3 IMPLANT

## 2014-03-12 NOTE — H&P (View-Only) (Signed)
CLINIC ENCOUNTER NOTE  History:  44 y.o. [id number] here today for evaluation and management of AUB for several months.  Was treated with Megace taper, currently on 40 mg po daily, Not helping with bleeding.  Also having some cramping, wants refill of Tramadol and Ibuprofen.   The following portions of the patient's history were reviewed and updated as appropriate: allergies, current medications, past family history, past medical history, past social history, past surgical history and problem list. Normal pap recently as per patient.  Review of Systems:  Pertinent items are noted in HPI.  Objective:  Physical Exam BP 124/89 mmHg  Pulse 94  Temp(Src) 98.3 F (36.8 C) (Oral)  Resp 20  Ht 5\' 5"  (1.651 m)  Wt 207 lb (93.895 kg)  BMI 34.45 kg/m2  LMP 12/02/2013 Gen: NAD Abd: Soft, obese, nontender and nondistended Pelvic: Normal appearing external genitalia; normal appearing vaginal mucosa and cervix.  Moderate amount of red blood in vault.  Small uterus, no other palpable masses, no uterine or adnexal tenderness  ENDOMETRIAL BIOPSY     The indications for endometrial biopsy were reviewed.   Risks of the biopsy including cramping, bleeding, infection, uterine perforation, inadequate specimen and need for additional procedures  were discussed. The patient states she understands and agrees to undergo procedure today. Consent was signed. Time out was performed. Urine HCG was negative. During the pelvic exam, the cervix was prepped with Betadine. The 3 mm pipelle was introduced into the endometrial cavity without difficulty to a depth of 10 cm multiple times as the pipelle kept filling up with blood, and a moderate amount of tissue was obtained and sent to pathology. The instruments were removed from the patient's vagina. The patient tolerated the procedure well. Routine post-procedure instructions were given to the patient.    Labs and Imaging 12/18/2013  TRANSABDOMINAL AND TRANSVAGINAL  ULTRASOUND OF PELVIS  CLINICAL DATA:  Dysfunctional uterine bleeding with clotsTECHNIQUE: Both transabdominal and transvaginal ultrasound examinations of the pelvis were performed. Transabdominal technique was performed for global imaging of the pelvis including uterus, ovaries, adnexal regions, and pelvic cul-de-sac. It was necessary to proceed with endovaginal exam following the transabdominal exam to visualize the endometrium and bilateral ovaries.  COMPARISON:  04/18/2007  FINDINGS: Uterus  Measurements: 9.3 x 4.8 x 5.5 cm. No fibroids or other mass visualized.  Endometrium  Thickness: 22 mm. Thickened/heterogeneous with associated focal color Doppler flow (image 28).  Right ovary  Measurements: 2.7 x 1.7 x 1.9 cm. Normal appearance/no adnexal mass.  Left ovary  Measurements: 2.1 x 1.3 x 1.4 cm. Normal appearance/no adnexal mass.  Other findings  No free fluid.  IMPRESSION: Endometrium is thickened/heterogeneous, measuring 22 mm, with associated vascularity.  If bleeding remains unresponsive to hormonal or medical therapy, focal lesion work-up with sonohysterogram should be considered. Endometrial biopsy should also be considered in pre-menopausal patients at high risk for endometrial carcinoma.  (Ref: Radiological Reasoning: Algorithmic Workup of Abnormal Vaginal Bleeding with Endovaginal Sonography and Sonohysterography. AJR 2008; 283:M62-94)   Electronically Signed   By: Julian Hy M.D.   On: 12/18/2013 14:23   Assessment & Plan:  Discussed management options for abnormal uterine bleeding including oral progesterone (increasing current dosage), Depo Provera, Mirena IUD, endometrial ablation (Novasure/Hydrothermal Ablation) or hysterectomy as definitive surgical management.  Discussed risks and benefits of each method.   Patient desires endometrial ablation, but wants Depo Provera in the interim - 150 mg IM given to patient today.  She was told that she will be  contacted by our surgical scheduler  regarding the time and date of her surgery (Hysteroscopy, Hydrothermal Endometrial Ablation); routine preoperative instructions of having nothing to eat or drink after midnight on the day prior to surgery and also coming to the hospital 1.5 hours prior to her time of surgery were also emphasized.  She was told she may be called for a preoperative appointment about a week prior to surgery and will be given further preoperative instructions at that visit. Printed patient education handouts about the procedure were given to the patient to review at home.  Will follow up endometrial biopsy.  Bleeding precautions reviewed.  Routine preventative health maintenance measures emphasized.   Verita Schneiders, MD, [place] Attending [place] for [employer], [place]

## 2014-03-12 NOTE — Transfer of Care (Signed)
Immediate Anesthesia Transfer of Care Note  Patient: Renee Collier  Procedure(s) Performed: Procedure(s): HYSTEROSCOPY WITH HYDROTHERMAL ABLATION (N/A)  Patient Location: PACU  Anesthesia Type:General  Level of Consciousness: awake, alert , oriented and patient cooperative  Airway & Oxygen Therapy: Patient Spontanous Breathing and Patient connected to nasal cannula oxygen  Post-op Assessment: Report given to RN and Post -op Vital signs reviewed and stable  Post vital signs: Reviewed and stable  Last Vitals:  Filed Vitals:   03/12/14 0849  BP: 144/93  Pulse: 98  Temp: 37 C  Resp: 16    Complications: No apparent anesthesia complications

## 2014-03-12 NOTE — Discharge Instructions (Addendum)
Endometrial Ablation Endometrial ablation removes the lining of the uterus (endometrium). It is usually a same-day, outpatient treatment. Ablation helps avoid major surgery, such as surgery to remove the cervix and uterus (hysterectomy). After endometrial ablation, you will have little or no menstrual bleeding and may not be able to have children. However, if you are premenopausal, you will need to use a reliable method of birth control following the procedure because of the small chance that pregnancy can occur. There are different reasons to have this procedure, which include:  Heavy periods.  Bleeding that is causing anemia.  Irregular bleeding.  Bleeding fibroids on the lining inside the uterus if they are smaller than 3 centimeters. This procedure should not be done if:  You want children in the future.  You have severe cramps with your menstrual period.  You have precancerous or cancerous cells in your uterus.  You were recently pregnant.  You have gone through menopause.  You have had major surgery on the uterus, such as a cesarean delivery. LET [facility] CARE PROVIDER KNOW ABOUT:  Any allergies you have.  All medicines you are taking, including vitamins, herbs, eye drops, creams, and over-the-counter medicines.  Previous problems you or members of your family have had with the use of anesthetics.  Any blood disorders you have.  Previous surgeries you have had.  Medical conditions you have. RISKS AND COMPLICATIONS  Generally, this is a safe procedure. However, as with any procedure, complications can occur. Possible complications include:  Perforation of the uterus.  Bleeding.  Infection of the uterus, bladder, or vagina.  Injury to surrounding organs.  Burn injury to vagina.  An air bubble to the lung (air embolus).  Pregnancy following the procedure.  Failure of the procedure to help the problem, requiring hysterectomy.  Decreased ability to diagnose  cancer in the lining of the uterus. PROCEDURE  During the procedure, your health care provider will use a tool called a resectoscope to help see inside your uterus. There are different ways to remove the lining of your uterus.  We used Hydrothermal Ablation:  Heated-Free Liquid - This method uses heated salt (saline) solution to remove the lining of the uterus.  AFTER THE PROCEDURE  After your procedure, do not have sexual intercourse or insert anything into your vagina x 2 weeks. After the procedure, you may experience:  Cramps.  Vaginal discharge.  Frequent urination.  No specific work restrictions Document Released: 11/28/2003 Document Revised: 09/20/2012 Document Reviewed: 06/21/2012 [facility] Patient Information 2015 [place]. This information is not intended to replace advice given to you by your health care provider. Make sure you discuss any questions you have with your health care provider.  Thermal Burn This happens when skin comes in contact with something very hot. It could be a flame, a hot object, hot liquid, or steam. A thermal burn usually heals in 3 to 4 weeks.  SYMPTOMS Symptoms of thermal burns include:  Severe pain.  Extreme tenderness.  Deep redness.  Blistered skin.  Skin that has changed color.It might look blotchy, wet, or shiny.  Swelling. TREATMENT Many thermal burns can be treated with regular first aid such as:   Cooling the burn. Use cool, germ-free (sterile) salt water. Place the burned area of skin into a tub of water, or cover the burned area with clean, wet towels.  Taking pain medicine.  Removing the dead skin from broken blisters. A trained caregiver may do this. Do not pop blisters.  Gently washing your  skin with mild soap.  Covering the burned area with a cream.Silver sulfadiazine is a cream for burns. An antibiotic cream, such as bacitracin, may also be used to fight infection. Do not use other ointments or creams unless your  caregiver says it is okay.  Taking an antibiotic. This can help prevent infection. HOME CARE INSTRUCTIONS Medication  Take any medicine prescribed by your caregiver. Follow the directions carefully.  Ask your caregiver if you can take over-the-counter medicine to relieve pain and swelling.   Make sure your caregiver knows about all other medicines you take.This includes over-the-counter medicines. Burn care  Gently clean the burned area.  Put ointment on it.  Do not put butter or oil on your skin. Use only the cream prescribed by your caregiver.  Do not put ice on your burn.  Do not break blisters on your skin.  Take showers, not tub bath  Do not scratch an itchy burn. Your caregiver may give you medicine to relieve very bad itching.  Infection is a big danger after a thermal burn. Tell your caregiver right away if you have signs of infection, such as:  Redness or changing color in the burned area.  Fluid leaking from the burn.  Swelling in the burn area.  A bad smell coming from the wound. Follow-up  Keep all follow-up appointments.This is important. This is how your caregiver can tell if your treatment is working. SEEK MEDICAL CARE IF:  You have any questions about medicines.  You have any questions about your treatment.  You wonder if it is okay to do a particular activity.  You develop a fever of more than 100.5 F (38.1 C). SEEK IMMEDIATE MEDICAL CARE IF:  You think your burn might be infected. It may change color, become red, leak fluid, swell, or smell bad.  You develop a fever of more than 102 F (38.9 C). Document Released: 06/22/2010 Document Revised: 04/12/2011 Document Reviewed: 06/22/2010 [facility] Patient Information 2015 [place], [place]. This information is not intended to replace advice given to you by your health care provider. Make sure you discuss any questions you have with your health care provider.  Bathing / Hygiene: Cleaning Perineal  Area   Use a hand-held shower for rinsing directly. To dry off, toweling may be insufficient. Use hair dryer on cool cycle after leaving shower area. Wait 5-10 minutes before dressing.  Copyright  VHI. All rights reserved.

## 2014-03-12 NOTE — Telephone Encounter (Signed)
Pt is inpatient today for surgery.

## 2014-03-12 NOTE — Anesthesia Procedure Notes (Signed)
Procedure Name: LMA Insertion Date/Time: 03/12/2014 9:43 AM Performed by: Raenette Rover Pre-anesthesia Checklist: Patient identified, Emergency Drugs available, Suction available and Patient being monitored Patient Re-evaluated:Patient Re-evaluated prior to inductionOxygen Delivery Method: Circle system utilized Preoxygenation: Pre-oxygenation with 100% oxygen Intubation Type: IV induction Ventilation: Mask ventilation without difficulty LMA: LMA inserted LMA Size: 4.0 Number of attempts: 1 Placement Confirmation: positive ETCO2,  CO2 detector and breath sounds checked- equal and bilateral Tube secured with: Tape Dental Injury: Teeth and Oropharynx as per pre-operative assessment
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
## 2014-03-12 NOTE — Anesthesia Preprocedure Evaluation (Signed)
Anesthesia Evaluation  Patient identified by MRN, date of birth, ID band Patient awake    Reviewed: Allergy & Precautions, NPO status , Patient's Chart, lab work & pertinent test results  Airway Mallampati: III  TM Distance: >3 FB Neck ROM: Full    Dental no notable dental hx. (+) Teeth Intact   Pulmonary neg pulmonary ROS,  breath sounds clear to auscultation  Pulmonary exam normal       Cardiovascular hypertension, Pt. on medications Rhythm:Regular Rate:Normal  12 lead EKG non-specific ST-T wave changes   Neuro/Psych negative neurological ROS     GI/Hepatic negative GI ROS, Neg liver ROS,   Endo/Other  Obesity  Renal/GU negative Renal ROS  negative genitourinary   Musculoskeletal negative musculoskeletal ROS (+)   Abdominal (+) + obese,   Peds  Hematology  (+) anemia ,   Anesthesia Other Findings   Reproductive/Obstetrics AUB                             Anesthesia Physical Anesthesia Plan  ASA: II  Anesthesia Plan: General   Post-op Pain Management:    Induction: Intravenous  Airway Management Planned: LMA  Additional Equipment:   Intra-op Plan:   Post-operative Plan: Extubation in OR  Informed Consent: I have reviewed the patients History and Physical, chart, labs and discussed the procedure including the risks, benefits and alternatives for the proposed anesthesia with the patient or authorized representative who has indicated his/her understanding and acceptance.   Dental advisory given  Plan Discussed with: CRNA, Anesthesiologist and Surgeon  Anesthesia Plan Comments:         Anesthesia Quick Evaluation

## 2014-03-12 NOTE — Anesthesia Postprocedure Evaluation (Signed)
  Anesthesia Post-op Note  Patient: Renee Collier  Procedure(s) Performed: Procedure(s): HYSTEROSCOPY WITH HYDROTHERMAL ABLATION (N/A)  Patient Location: PACU  Anesthesia Type:General  Level of Consciousness: awake, alert  and oriented  Airway and Oxygen Therapy: Patient Spontanous Breathing  Post-op Pain: none  Post-op Assessment: Post-op Vital signs reviewed, Patient's Cardiovascular Status Stable, Respiratory Function Stable, Patent Airway, No signs of Nausea or vomiting and Pain level controlled  Post-op Vital Signs: Reviewed and stable  Last Vitals:  Filed Vitals:   03/12/14 1115  BP: 158/98  Pulse: 101  Temp:   Resp: 20    Complications: No apparent anesthesia complications

## 2014-03-12 NOTE — Interval H&P Note (Signed)
History and Physical Interval Note 03/12/2014 9:16 AM  Renee Collier  has presented today for surgery, with the diagnosis of Abnormal uterine bleeding. The various methods of treatment have been discussed with the patient and family. After consideration of risks, benefits and other options for treatment, the patient has consented to HYSTEROSCOPY WITH HYDROTHERMAL ABLATION (N/A) as a surgical intervention .  The patient's history has been reviewed, patient examined, no change in status, stable for surgery.  I have reviewed the patient's chart and labs.  Questions were answered to the patient's satisfaction.  To OR when ready.  Verita Schneiders, MD, [place] Attending [place], [facility]

## 2014-03-13 ENCOUNTER — Encounter (HOSPITAL_COMMUNITY): Payer: Self-pay | Admitting: Obstetrics & Gynecology

## 2014-03-13 ENCOUNTER — Telehealth: Payer: Self-pay | Admitting: Obstetrics & Gynecology

## 2014-03-13 DIAGNOSIS — T283XXS Burn of internal genitourinary organs, sequela: Secondary | ICD-10-CM
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
## 2014-03-18 ENCOUNTER — Ambulatory Visit: Payer: Medicaid Other | Admitting: Obstetrics & Gynecology

## 2014-03-19 ENCOUNTER — Telehealth: Payer: Self-pay | Admitting: Obstetrics & Gynecology
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
## 2014-03-21 ENCOUNTER — Ambulatory Visit (INDEPENDENT_AMBULATORY_CARE_PROVIDER_SITE_OTHER): Payer: Self-pay | Admitting: Obstetrics & Gynecology

## 2014-03-21 ENCOUNTER — Encounter: Payer: Self-pay | Admitting: Obstetrics & Gynecology

## 2014-03-21 VITALS — BP 134/79 | HR 94 | Temp 98.7°F | Ht 63.0 in | Wt 206.5 lb

## 2014-03-21 DIAGNOSIS — T283XXD Burn of internal genitourinary organs, subsequent encounter: Secondary | ICD-10-CM
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
## 2014-03-21 NOTE — Patient Instructions (Signed)
Return to clinic for any scheduled appointments or for any gynecologic concerns as needed.   

## 2014-04-08 ENCOUNTER — Encounter: Payer: Self-pay | Admitting: *Deleted

## 2014-04-10 ENCOUNTER — Encounter: Payer: Self-pay | Admitting: Obstetrics & Gynecology

## 2014-04-10 ENCOUNTER — Other Ambulatory Visit: Payer: Self-pay | Admitting: Obstetrics & Gynecology

## 2014-04-10 ENCOUNTER — Ambulatory Visit (INDEPENDENT_AMBULATORY_CARE_PROVIDER_SITE_OTHER): Payer: Self-pay | Admitting: Obstetrics & Gynecology

## 2014-04-10 VITALS — BP 128/59 | HR 98 | Temp 99.5°F | Ht 63.0 in | Wt 208.3 lb

## 2014-04-10 DIAGNOSIS — Z4889 Encounter for other specified surgical aftercare: Secondary | ICD-10-CM

## 2014-04-10 DIAGNOSIS — N9089 Other specified noninflammatory disorders of vulva and perineum: Secondary | ICD-10-CM

## 2014-04-10 DIAGNOSIS — Z9889 Other specified postprocedural states: Secondary | ICD-10-CM

## 2014-04-10 DIAGNOSIS — T283XXD Burn of internal genitourinary organs, subsequent encounter: Secondary | ICD-10-CM

## 2014-04-10 MED ORDER — TRIAMCINOLONE ACETONIDE 0.1 % EX CREA
1.0000 | TOPICAL_CREAM | Freq: Two times a day (BID) | CUTANEOUS | Status: DC
Start: 2014-04-10 — End: 2014-05-28
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
## 2014-04-10 NOTE — Patient Instructions (Signed)
Return to clinic for any scheduled appointments or for any gynecologic concerns as needed.   

## 2014-04-11 LAB — WET PREP, GENITAL
CLUE CELLS WET PREP: NONE SEEN
TRICH WET PREP: NONE SEEN
Yeast Wet Prep HPF POC: NONE SEEN

## 2014-05-28 ENCOUNTER — Emergency Department (INDEPENDENT_AMBULATORY_CARE_PROVIDER_SITE_OTHER): Payer: Self-pay

## 2014-05-28 ENCOUNTER — Emergency Department (INDEPENDENT_AMBULATORY_CARE_PROVIDER_SITE_OTHER)
Admission: EM | Admit: 2014-05-28 | Discharge: 2014-05-28 | Disposition: A | Discharge status: Home / Self Care | Payer: Self-pay | Source: Home / Self Care | Attending: Family Medicine | Admitting: Family Medicine

## 2014-05-28 ENCOUNTER — Encounter (HOSPITAL_COMMUNITY): Payer: Self-pay | Admitting: Family Medicine

## 2014-05-28 DIAGNOSIS — H6983 Other specified disorders of Eustachian tube, bilateral: Secondary | ICD-10-CM

## 2014-05-28 DIAGNOSIS — J40 Bronchitis, not specified as acute or chronic: Secondary | ICD-10-CM

## 2014-05-28 DIAGNOSIS — I10 Essential (primary) hypertension: Secondary | ICD-10-CM

## 2014-05-28 DIAGNOSIS — R509 Fever, unspecified: Secondary | ICD-10-CM

## 2014-05-28 LAB — POCT RAPID STREP A: Streptococcus, Group A Screen (Direct): NEGATIVE

## 2014-05-28 MED ORDER — FLUCONAZOLE 150 MG PO TABS: 150.0000 mg | ORAL_TABLET | Freq: Every day | ORAL | Status: DC

## 2014-05-28 MED ORDER — IBUPROFEN 800 MG PO TABS
ORAL_TABLET | ORAL | Status: AC
  Filled 2014-05-28: qty 1

## 2014-05-28 MED ORDER — IPRATROPIUM BROMIDE 0.06 % NA SOLN: 2.0000 | Freq: Four times a day (QID) | NASAL | Status: DC

## 2014-05-28 MED ORDER — HYDROCOD POLST-CPM POLST ER 10-8 MG/5ML PO SUER: 2.5000 mL | Freq: Every evening | ORAL | Status: DC | PRN

## 2014-05-28 MED ORDER — DEXAMETHASONE 2 MG PO TABS
ORAL_TABLET | ORAL | Status: AC
  Filled 2014-05-28: qty 1

## 2014-05-28 MED ORDER — ALBUTEROL SULFATE 108 (90 BASE) MCG/ACT IN AEPB: 2.0000 | INHALATION_SPRAY | RESPIRATORY_TRACT | Status: DC | PRN

## 2014-05-28 MED ORDER — DEXAMETHASONE 4 MG PO TABS
ORAL_TABLET | ORAL | Status: AC
  Filled 2014-05-28: qty 2

## 2014-05-28 MED ORDER — IBUPROFEN 800 MG PO TABS
800.0000 mg | ORAL_TABLET | Freq: Once | ORAL | Status: AC
  Administered 2014-05-28: 800 mg via ORAL

## 2014-05-28 MED ORDER — DEXAMETHASONE 4 MG PO TABS
10.0000 mg | ORAL_TABLET | Freq: Once | ORAL | Status: AC
  Administered 2014-05-28: 10 mg via ORAL

## 2014-05-28 MED ORDER — DOXYCYCLINE HYCLATE 100 MG PO TABS: 100.0000 mg | ORAL_TABLET | Freq: Two times a day (BID) | ORAL | Status: DC

## 2014-05-28 NOTE — Discharge Instructions (Signed)
Your symptoms are due to bronchitis. Your given steroid and Motrin in the clinic to help with this. Please use the albuterol as needed for wheezing. Please start using ibuprofen 600 mg every 6 hours, a daily allergy pill such as Zyrtec, Mucinex twice daily, and the nasal Atrovent then for symptomatic relief. If you continue to worsen then start the doxycycline antibiotics. If you develop a yeast infection start the Diflucan.

## 2014-05-28 NOTE — ED Provider Notes (Addendum)
CSN: [id number]     Arrival date & time 05/28/14  1630 History   First MD Initiated Contact with Patient 05/28/14 1730     Chief Complaint  Patient presents with  . Sore Throat  . Fever   (Consider location/radiation/quality/duration/timing/severity/associated sxs/prior Treatment) HPI  2 weeks ago pt developed throat pain and HA. Theraflu w/ improvement. Started to improve then one day ago developed worsening HA, new onset cough, runny nose, throat pain, ear pain, body aches, chest discomfort w/ coughing. Started to wheeze. Now w/ fevers. Has not taken anything for her symptoms this time.  Not take blood pressure medications this morning. But denies chest pain, palpitations, nausea, vomiting.   Past Medical History  Diagnosis Date  . Hypertension    Past Surgical History  Procedure Laterality Date  . Tubal ligation    . Hysteroscopy N/A 03/12/2014    Procedure: HYSTEROSCOPY WITH HYDROTHERMAL ABLATION;  Surgeon: Osborne Oman, MD;  Location: [place] [facility];  Service: Gynecology;  Laterality: N/A;  . Endometrial ablation     Family History  Problem Relation Age of Onset  . Cancer Mother    History  Substance Use Topics  . Smoking status: Never Smoker   . Smokeless tobacco: Never Used  . Alcohol Use: No   OB History    Gravida Para Term Preterm AB TAB SAB Ectopic Multiple Living   3 3 3       3      Review of Systems Per HPI with all other pertinent systems negative.    Allergies  Review of patient's allergies indicates no known allergies.  Home Medications   Prior to Admission medications   Medication Sig Start Date End Date Taking? Authorizing Provider  hydrALAZINE (APRESOLINE) 10 MG tablet Take 1 tablet (10 mg total) by mouth 3 (three) times daily. 01/10/14  Yes Lance Bosch, NP  Albuterol Sulfate (PROAIR RESPICLICK) 244 (90 BASE) MCG/ACT AEPB Inhale 2 puffs into the lungs every 4 (four) hours as needed (wheezing). Every 4 hours as needed for wheezing 05/28/14   Waldemar Dickens, MD  chlorpheniramine-HYDROcodone [facility]) 10-8 MG/5ML SUER Take 2.5-5 mLs by mouth at bedtime as needed for cough. 05/28/14   Waldemar Dickens, MD  doxycycline (VIBRA-TABS) 100 MG tablet Take 1 tablet (100 mg total) by mouth 2 (two) times daily. 05/28/14   Waldemar Dickens, MD  fluconazole (DIFLUCAN) 150 MG tablet Take 1 tablet (150 mg total) by mouth daily. Repeat dose in 3 days 05/28/14   Waldemar Dickens, MD  ibuprofen (ADVIL,MOTRIN) 800 MG tablet Take 1 tablet (800 mg total) by mouth every 6 (six) hours as needed for moderate pain or cramping. 02/27/14   Osborne Oman, MD  ipratropium (ATROVENT) 0.06 % nasal spray Place 2 sprays into both nostrils 4 (four) times daily. 05/28/14   Waldemar Dickens, MD   Pulse 130  Temp(Src) 102.1 F (38.9 C) (Oral)  Resp 16  SpO2 96%  LMP 03/12/2014 Physical Exam Physical Exam  Constitutional: oriented to person, place, and time. appears well-developed and well-nourished. No distress.  HENT:  Head: Normocephalic and atraumatic. Left tympanic membrane with clear effusion, but no bulging or retraction of the eardrum. Right TM with minimal clear fluid present as well. Nasal congestion on exam. Tonsils 1+ without exudate and normal pharynx.  Eyes: EOMI. PERRL.  Neck: Normal range of motion.  Cardiovascular: RRR, no m/r/g, 2+ distal pulses,  Pulmonary/Chest: Effort normal and breath sounds normal. No respiratory distress.  Abdominal: Soft. Bowel sounds are normal. NonTTP, no distension.  Musculoskeletal: Normal range of motion. Non ttp, no effusion.  Neurological: alert and oriented to person, place, and time.  Skin: Skin is warm. No rash noted. non diaphoretic.  Psychiatric: normal mood and affect. behavior is normal. Judgment and thought content normal.   ED Course  Procedures (including critical care time) Labs Review Labs Reviewed  CULTURE, GROUP A STREP  POCT RAPID STREP A ([facility]RG CARE ONLY)    Imaging Review Dg Chest 2  View  05/28/2014   CLINICAL DATA:  44 year old female with cough and chest pain.  EXAM: CHEST  2 VIEW  COMPARISON:  No priors.  FINDINGS: Lung volumes are normal. No consolidative airspace disease. No pleural effusions. No pneumothorax. No pulmonary nodule or mass noted. Pulmonary vasculature and the cardiomediastinal silhouette are within normal limits.  IMPRESSION: No radiographic evidence of acute cardiopulmonary disease.   Electronically Signed   By: Vinnie Langton M.D.   On: 05/28/2014 18:41     MDM   1. Bronchitis   2. Febrile illness   3. Eustachian tube dysfunction, bilateral   4. Essential hypertension    Decadron 10 mg by mouth given to help with bronchitis as well as eustachian tube dysfunction. Motrin 800 mg by mouth given for fever. Rapid strep negative. Strep culture sent but anticipate this to be negative as well. Two-view chest x-ray without evidence of acute process such as pneumonia. Viral etiology likely and recommending patient start her treatment with Mucinex, over-the-counter allergy medicine such as Zyrtec, nasal Atrovent, ibuprofen and Tylenol, and albuterol. If patient's symptoms continue to worsen have given the patient okay to start doxycycline as this is a reoccurrence of a similar infection and is now been ongoing essentially for 2 weeks. Diflucan prescription provided if patient develops recent infection. Limited small dose of Tussionex provided for p.m. cough relief.  Patient to restart her blood pressure medications tonight.  Waldemar Dickens, MD 05/28/14 1904  Waldemar Dickens, MD 05/28/14 [phone]

## 2014-05-28 NOTE — ED Notes (Signed)
C/o  Sore throat.  Chest discomfort.  Headache.   Present x 3 wks.   No relief with otc meds.

## 2014-05-30 LAB — CULTURE, GROUP A STREP: STREP A CULTURE: NEGATIVE

## 2014-09-01 ENCOUNTER — Encounter (HOSPITAL_COMMUNITY): Payer: Self-pay | Admitting: Emergency Medicine

## 2014-09-01 ENCOUNTER — Emergency Department (INDEPENDENT_AMBULATORY_CARE_PROVIDER_SITE_OTHER)
Admission: EM | Admit: 2014-09-01 | Discharge: 2014-09-01 | Disposition: A | Discharge status: Home / Self Care | Payer: Self-pay | Source: Home / Self Care | Attending: Family Medicine | Admitting: Family Medicine

## 2014-09-01 DIAGNOSIS — M25512 Pain in left shoulder: Secondary | ICD-10-CM

## 2014-09-01 DIAGNOSIS — M7552 Bursitis of left shoulder: Secondary | ICD-10-CM

## 2014-09-01 MED ORDER — HYDROCODONE-ACETAMINOPHEN 5-325 MG PO TABS: 1.0000 | ORAL_TABLET | Freq: Four times a day (QID) | ORAL | Status: DC | PRN

## 2014-09-01 MED ORDER — DICLOFENAC SODIUM 75 MG PO TBEC: DELAYED_RELEASE_TABLET | ORAL | Status: DC

## 2014-09-01 NOTE — Discharge Instructions (Signed)
Bursitis Bursitis is when the fluid-filled sac (bursa) that covers and protects a joint gets puffy and irritated. The elbow, shoulder, hip, and knee joints are most often affected. HOME CARE  Put ice on the area.  Put ice in a plastic bag.  Place a towel between your skin and the bag.  Leave the ice on for 15-20 minutes, 03-04 times a day.  Put the joint through a full range of motion 4 times a day. Rest the injured joint at other times. When you have less pain, begin slow movements and usual activities.  Only take medicine as told by your doctor.  Follow up with your doctor. Any delay in care could stop the bursitis from healing. This could cause long-term pain. GET HELP RIGHT AWAY IF:   You have more pain with treatment.  You have a temperature by mouth above 102 F (38.9 C), not controlled by medicine.  You have heat and irritation over the fluid-filled sac. MAKE SURE YOU:   Understand these instructions.  Will watch your condition.  Will get help right away if you are not doing well or get worse. Document Released: 07/08/2009 Document Revised: 04/12/2011 Document Reviewed: 04/09/2013 [facility] Patient Information 2015 [place], [place]. This information is not intended to replace advice given to you by your health care provider. Make sure you discuss any questions you have with your health care provider.   This is probably a bursitis in the shoulder. Take the NSAIDs 2x a day with food x 2 weeks then prn. Use the pain medication sparingly.

## 2014-09-01 NOTE — ED Notes (Signed)
Pt states that her left shoulder has been hurting for at least 2 weeks, pt denies any injury or fall

## 2014-09-01 NOTE — ED Provider Notes (Signed)
CSN: [id number]     Arrival date & time 09/01/14  1646 History   First MD Initiated Contact with Patient 09/01/14 1809     Chief Complaint  Patient presents with  . Shoulder Pain   (Consider location/radiation/quality/duration/timing/severity/associated sxs/prior Treatment) HPI Comments: Renee Collier presents with left shoulder pain x 2 weeks. No known injury is noted. She does lift repetat ive at work. Pain is severe at times; worse at night. Denies neck pain or numbness. No specific weakness is noted. No loss of strength.   The history is provided by the patient.    Past Medical History  Diagnosis Date  . Hypertension    Past Surgical History  Procedure Laterality Date  . Tubal ligation    . Hysteroscopy N/A 03/12/2014    Procedure: HYSTEROSCOPY WITH HYDROTHERMAL ABLATION;  Surgeon: Osborne Oman, MD;  Location: [facility];  Service: Gynecology;  Laterality: N/A;  . Endometrial ablation     Family History  Problem Relation Age of Onset  . Cancer Mother    History  Substance Use Topics  . Smoking status: Never Smoker   . Smokeless tobacco: Never Used  . Alcohol Use: No   OB History    Gravida Para Term Preterm AB TAB SAB Ectopic Multiple Living   3 3 3       3      Review of Systems  All other systems reviewed and are negative.   Allergies  Review of patient's allergies indicates no known allergies.  Home Medications   Prior to Admission medications   Medication Sig Start Date End Date Taking? Authorizing Provider  Albuterol Sulfate (PROAIR RESPICLICK) 277 (90 BASE) MCG/ACT AEPB Inhale 2 puffs into the lungs every 4 (four) hours as needed (wheezing). Every 4 hours as needed for wheezing 05/28/14   Waldemar Dickens, MD  chlorpheniramine-HYDROcodone [facility]) 10-8 MG/5ML SUER Take 2.5-5 mLs by mouth at bedtime as needed for cough. 05/28/14   Waldemar Dickens, MD  diclofenac (VOLTAREN) 75 MG EC tablet Take 1 tablet po bid pc x 2 weeks then prn 09/01/14    Bjorn Pippin, PA-C  doxycycline (VIBRA-TABS) 100 MG tablet Take 1 tablet (100 mg total) by mouth 2 (two) times daily. 05/28/14   Waldemar Dickens, MD  fluconazole (DIFLUCAN) 150 MG tablet Take 1 tablet (150 mg total) by mouth daily. Repeat dose in 3 days 05/28/14   Waldemar Dickens, MD  hydrALAZINE (APRESOLINE) 10 MG tablet Take 1 tablet (10 mg total) by mouth 3 (three) times daily. 01/10/14   Lance Bosch, NP  HYDROcodone-acetaminophen (NORCO/VICODIN) 5-325 MG per tablet Take 1-2 tablets by mouth every 6 (six) hours as needed for moderate pain. 09/01/14   Bjorn Pippin, PA-C  ibuprofen (ADVIL,MOTRIN) 800 MG tablet Take 1 tablet (800 mg total) by mouth every 6 (six) hours as needed for moderate pain or cramping. 02/27/14   Osborne Oman, MD  ipratropium (ATROVENT) 0.06 % nasal spray Place 2 sprays into both nostrils 4 (four) times daily. 05/28/14   Waldemar Dickens, MD   BP 146/86 mmHg  Pulse 87  Temp(Src) 97.8 F (36.6 C) (Oral)  Resp 18  SpO2 99% Physical Exam  Constitutional: She is oriented to person, place, and time. She appears well-developed and well-nourished. No distress.  HENT:  Head: Normocephalic and atraumatic.  Mouth/Throat: No oropharyngeal exudate.  Neck: Normal range of motion. Neck supple.  Pulmonary/Chest: Effort normal.  Musculoskeletal:  No shoulder effusions or visible  abnormality. Decreased and painful IR, and abd. Positive impingement sign is noted. Full ROM of the cervical spine. Negative Spurling.   Neurological: She is alert and oriented to person, place, and time.  Skin: Skin is warm and dry. She is not diaphoretic.  Psychiatric: Her behavior is normal.  Nursing note and vitals reviewed.   ED Course  Procedures (including critical care time) Labs Review Labs Reviewed - No data to display  Imaging Review No results found.   MDM   1. Left shoulder pain   2. Shoulder bursitis, left    Treat with ice, rest, work note, NSAIDs and pain meds for  night time only. [place] [facility] Data base clear. F/U with Ortho if worsens. Gentle ROM instruction also given.     Bjorn Pippin, PA-C 09/01/14 [phone]

## 2015-11-24 ENCOUNTER — Emergency Department (HOSPITAL_COMMUNITY): Payer: Medicaid Other

## 2015-11-24 ENCOUNTER — Encounter (HOSPITAL_COMMUNITY): Payer: Self-pay

## 2015-11-24 ENCOUNTER — Emergency Department (HOSPITAL_COMMUNITY)
Admission: EM | Admit: 2015-11-24 | Discharge: 2015-11-24 | Disposition: A | Discharge status: Home / Self Care | Payer: Medicaid Other | Attending: Emergency Medicine | Admitting: Emergency Medicine

## 2015-11-24 DIAGNOSIS — R791 Abnormal coagulation profile: Secondary | ICD-10-CM | POA: Insufficient documentation

## 2015-11-24 DIAGNOSIS — I10 Essential (primary) hypertension: Secondary | ICD-10-CM | POA: Insufficient documentation

## 2015-11-24 DIAGNOSIS — G43109 Migraine with aura, not intractable, without status migrainosus: Secondary | ICD-10-CM | POA: Insufficient documentation

## 2015-11-24 LAB — COMPREHENSIVE METABOLIC PANEL
ALBUMIN: 3.7 g/dL (ref 3.5–5.0)
ALK PHOS: 97 U/L (ref 38–126)
ALT: 10 U/L — ABNORMAL LOW (ref 14–54)
ANION GAP: 7 (ref 5–15)
AST: 16 U/L (ref 15–41)
BILIRUBIN TOTAL: 0.4 mg/dL (ref 0.3–1.2)
BUN: 8 mg/dL (ref 6–20)
CALCIUM: 9.4 mg/dL (ref 8.9–10.3)
CO2: 27 mmol/L (ref 22–32)
Chloride: 106 mmol/L (ref 101–111)
Creatinine, Ser: 0.92 mg/dL (ref 0.44–1.00)
GLUCOSE: 109 mg/dL — AB (ref 65–99)
POTASSIUM: 3.4 mmol/L — AB (ref 3.5–5.1)
Sodium: 140 mmol/L (ref 135–145)
TOTAL PROTEIN: 7.2 g/dL (ref 6.5–8.1)

## 2015-11-24 LAB — DIFFERENTIAL
Basophils Absolute: 0 10*3/uL (ref 0.0–0.1)
Basophils Relative: 0 %
EOS ABS: 0.1 10*3/uL (ref 0.0–0.7)
EOS PCT: 1 %
LYMPHS ABS: 3.4 10*3/uL (ref 0.7–4.0)
LYMPHS PCT: 36 %
MONO ABS: 0.4 10*3/uL (ref 0.1–1.0)
MONOS PCT: 5 %
NEUTROS PCT: 58 %
Neutro Abs: 5.6 10*3/uL (ref 1.7–7.7)

## 2015-11-24 LAB — CBC
HCT: 41.6 % (ref 36.0–46.0)
HEMOGLOBIN: 13.7 g/dL (ref 12.0–15.0)
MCH: 27.7 pg (ref 26.0–34.0)
MCHC: 32.9 g/dL (ref 30.0–36.0)
MCV: 84.2 fL (ref 78.0–100.0)
Platelets: 429 10*3/uL — ABNORMAL HIGH (ref 150–400)
RBC: 4.94 MIL/uL (ref 3.87–5.11)
RDW: 15.5 % (ref 11.5–15.5)
WBC: 9.5 10*3/uL (ref 4.0–10.5)

## 2015-11-24 LAB — I-STAT CHEM 8, ED
BUN: 8 mg/dL (ref 6–20)
CALCIUM ION: 1.22 mmol/L (ref 1.15–1.40)
Chloride: 102 mmol/L (ref 101–111)
Creatinine, Ser: 0.9 mg/dL (ref 0.44–1.00)
Glucose, Bld: 109 mg/dL — ABNORMAL HIGH (ref 65–99)
HEMATOCRIT: 45 % (ref 36.0–46.0)
HEMOGLOBIN: 15.3 g/dL — AB (ref 12.0–15.0)
Potassium: 3.4 mmol/L — ABNORMAL LOW (ref 3.5–5.1)
SODIUM: 143 mmol/L (ref 135–145)
TCO2: 27 mmol/L (ref 0–100)

## 2015-11-24 LAB — PROTIME-INR
INR: 1
Prothrombin Time: 13.2 seconds (ref 11.4–15.2)

## 2015-11-24 LAB — APTT: aPTT: 34 seconds (ref 24–36)

## 2015-11-24 LAB — I-STAT TROPONIN, ED: TROPONIN I, POC: 0 ng/mL (ref 0.00–0.08)

## 2015-11-24 MED ORDER — DIPHENHYDRAMINE HCL 50 MG/ML IJ SOLN
25.0000 mg | Freq: Once | INTRAMUSCULAR | Status: AC
  Administered 2015-11-24: 25 mg via INTRAVENOUS
  Filled 2015-11-24: qty 1

## 2015-11-24 MED ORDER — SODIUM CHLORIDE 0.9 % IV BOLUS (SEPSIS)
1000.0000 mL | Freq: Once | INTRAVENOUS | Status: AC
  Administered 2015-11-24: 1000 mL via INTRAVENOUS

## 2015-11-24 MED ORDER — KETOROLAC TROMETHAMINE 30 MG/ML IJ SOLN
30.0000 mg | Freq: Once | INTRAMUSCULAR | Status: AC
  Administered 2015-11-24: 30 mg via INTRAVENOUS
  Filled 2015-11-24: qty 1

## 2015-11-24 MED ORDER — PROCHLORPERAZINE EDISYLATE 5 MG/ML IJ SOLN
10.0000 mg | Freq: Once | INTRAMUSCULAR | Status: AC
  Administered 2015-11-24: 10 mg via INTRAVENOUS
  Filled 2015-11-24: qty 2

## 2015-11-24 NOTE — ED Provider Notes (Signed)
[facility] DEPT Provider Note   CSN: [id number] Arrival date & time: 11/24/15  1728     History   Chief Complaint Chief Complaint  Patient presents with  . Dizziness  . Hypertension  . Migraine    HPI Renee Collier is a 45 y.o. female with a hx of intermittent headaches presents to the ED noting the gradual onset of a headache bitemporal and occipital in distribution since Saturday. She states that she tried taking an OTC cold reliever with tylenol in it that transiently helped her sx shortly after it started, but when her headache returned after it wore off, she has not taken anything further. She notes associated mild photophobia, phonophobia, nausea, and nonbloody vomiting.  She also has noted some scotomas in her visual field with flashes of light in her peripheral vision. Denies diplopia, focal neurologic deficits, recent head trauma, fever, chills, neck pain or stiffness. She rates her HA as a 10/10 currently throbbing in nature.   HPI  Past Medical History:  Diagnosis Date  . Hypertension     Patient Active Problem List   Diagnosis Date Noted  . Status post HTA endometrial ablation 04/10/2014  . Abnormal uterine bleeding (AUB) 12/18/2013    Past Surgical History:  Procedure Laterality Date  . ENDOMETRIAL ABLATION    . HYSTEROSCOPY N/A 03/12/2014   Procedure: HYSTEROSCOPY WITH HYDROTHERMAL ABLATION;  Surgeon: Osborne Oman, MD;  Location: [facility];  Service: Gynecology;  Laterality: N/A;  . TUBAL LIGATION      OB History    Gravida Para Term Preterm AB Living   3 3 3     3    SAB TAB Ectopic Multiple Live Births                   Home Medications    Prior to Admission medications   Medication Sig Start Date End Date Taking? Authorizing Provider  Albuterol Sulfate (PROAIR RESPICLICK) [id number] (90 BASE) MCG/ACT AEPB Inhale 2 puffs into the lungs every 4 (four) hours as needed (wheezing). Every 4 hours as needed for wheezing 05/28/14  Yes Waldemar Dickens, MD    chlorpheniramine-HYDROcodone [facility] ER) 10-8 MG/5ML SUER Take 2.5-5 mLs by mouth at bedtime as needed for cough. Patient not taking: Reported on 11/24/2015 05/28/14   Waldemar Dickens, MD  diclofenac (VOLTAREN) 75 MG EC tablet Take 1 tablet po bid pc x 2 weeks then prn Patient not taking: Reported on 11/24/2015 09/01/14   Bjorn Pippin, PA-C  doxycycline (VIBRA-TABS) 100 MG tablet Take 1 tablet (100 mg total) by mouth 2 (two) times daily. Patient not taking: Reported on 11/24/2015 05/28/14   Waldemar Dickens, MD  fluconazole (DIFLUCAN) 150 MG tablet Take 1 tablet (150 mg total) by mouth daily. Repeat dose in 3 days Patient not taking: Reported on 11/24/2015 05/28/14   Waldemar Dickens, MD  hydrALAZINE (APRESOLINE) 10 MG tablet Take 1 tablet (10 mg total) by mouth 3 (three) times daily. Patient not taking: Reported on 11/24/2015 01/10/14   Lance Bosch, NP  HYDROcodone-acetaminophen (NORCO/VICODIN) 5-325 MG per tablet Take 1-2 tablets by mouth every 6 (six) hours as needed for moderate pain. Patient not taking: Reported on 11/24/2015 09/01/14   Bjorn Pippin, PA-C  ibuprofen (ADVIL,MOTRIN) 800 MG tablet Take 1 tablet (800 mg total) by mouth every 6 (six) hours as needed for moderate pain or cramping. Patient not taking: Reported on 11/24/2015 02/27/14   Osborne Oman, MD  ipratropium (ATROVENT) 0.06 %  nasal spray Place 2 sprays into both nostrils 4 (four) times daily. Patient not taking: Reported on 11/24/2015 05/28/14   Waldemar Dickens, MD    Family History Family History  Problem Relation Age of Onset  . Cancer Mother     Social History Social History  Substance Use Topics  . Smoking status: Never Smoker  . Smokeless tobacco: Never Used  . Alcohol use No     Allergies   Review of patient's allergies indicates no known allergies.   Review of Systems Review of Systems  Constitutional: Positive for activity change and appetite change. Negative for chills and  fever.  HENT: Negative for congestion, rhinorrhea, sinus pressure, sneezing and sore throat.   Eyes: Positive for photophobia and visual disturbance. Negative for pain and redness.  Respiratory: Negative for cough, chest tightness and shortness of breath.   Cardiovascular: Negative for chest pain.  Gastrointestinal: Positive for nausea and vomiting. Negative for abdominal pain and diarrhea.  Musculoskeletal: Negative for back pain, neck pain and neck stiffness.  Skin: Negative for rash.  Neurological: Positive for headaches. Negative for dizziness, syncope, facial asymmetry, speech difficulty, weakness, light-headedness and numbness.  All other systems reviewed and are negative.    Physical Exam Updated Vital Signs BP 143/96   Pulse 89   Temp 98.6 F (37 C)   Resp 20   Ht 5\' 2"  (1.575 m)   Wt 94 kg   SpO2 100%   BMI 37.92 kg/m   Physical Exam  Constitutional: She is oriented to person, place, and time. She appears well-developed and well-nourished. No distress.  HENT:  Head: Normocephalic and atraumatic.  Nose: Nose normal.  Mouth/Throat: Oropharynx is clear and moist.  Eyes: Conjunctivae and EOM are normal. Pupils are equal, round, and reactive to light.  Neck: Normal range of motion. Neck supple.  Cardiovascular: Normal rate, regular rhythm, normal heart sounds and intact distal pulses.   Pulmonary/Chest: Effort normal and breath sounds normal. She exhibits no tenderness.  Abdominal: Soft. She exhibits no distension. There is no tenderness.  Musculoskeletal: She exhibits no edema or tenderness.  Neurological: She is alert and oriented to person, place, and time. She has normal strength. No cranial nerve deficit or sensory deficit. Coordination and gait normal. GCS eye subscore is 4. GCS verbal subscore is 5. GCS motor subscore is 6.  No dysmetria. No focal neuro deficits  Skin: Skin is warm and dry. No rash noted. She is not diaphoretic.  Nursing note and vitals  reviewed.    ED Treatments / Results  Labs (all labs ordered are listed, but only abnormal results are displayed) Labs Reviewed  CBC - Abnormal; Notable for the following:       Result Value   Platelets 429 (*)    All other components within normal limits  COMPREHENSIVE METABOLIC PANEL - Abnormal; Notable for the following:    Potassium 3.4 (*)    Glucose, Bld 109 (*)    ALT 10 (*)    All other components within normal limits  I-STAT CHEM 8, ED - Abnormal; Notable for the following:    Potassium 3.4 (*)    Glucose, Bld 109 (*)    Hemoglobin 15.3 (*)    All other components within normal limits  PROTIME-INR  APTT  DIFFERENTIAL  I-STAT TROPOININ, ED    EKG  EKG Interpretation None       Radiology Ct Head Wo Contrast  Result Date: 11/24/2015 CLINICAL DATA:  Right-sided and posterior headache beginning  2 days ago with blurry vision and dizziness. EXAM: CT HEAD WITHOUT CONTRAST TECHNIQUE: Contiguous axial images were obtained from the base of the skull through the vertex without intravenous contrast. COMPARISON:  None. FINDINGS: Brain: There is no evidence of acute cortical infarct, intracranial hemorrhage, mass, midline shift, or extra-axial fluid collection. The ventricles and sulci are normal. Partially empty sella. Vascular: No hyperdense vessel or unexpected calcification. Skull: No fracture or focal osseous lesion. Sinuses/Orbits: Small volume left sphenoid sinus secretions. Clear mastoid air cells. Unremarkable orbits. Other: None. IMPRESSION: 1. No evidence of acute intracranial abnormality. 2. Partially empty sella. Electronically Signed   By: Logan Bores M.D.   On: 11/24/2015 19:49    Procedures Procedures (including critical care time)  Medications Ordered in ED Medications  sodium chloride 0.9 % bolus 1,000 mL (0 mLs Intravenous Stopped 11/24/15 2137)  prochlorperazine (COMPAZINE) injection 10 mg (10 mg Intravenous Given 11/24/15 2109)  diphenhydrAMINE  (BENADRYL) injection 25 mg (25 mg Intravenous Given 11/24/15 2107)  ketorolac (TORADOL) 30 MG/ML injection 30 mg (30 mg Intravenous Given 11/24/15 2108)     Initial Impression / Assessment and Plan / ED Course  I have reviewed the triage vital signs and the nursing notes.  Pertinent labs & imaging results that were available during my care of the patient were reviewed by me and considered in my medical decision making (see chart for details).  Clinical Course   45 y.o. female presents to the ED with hx consistent with migraine headache. Labs and CT head ordered in triage were reviewed and showed no significant abnormalities. She was given a migraine cocktail and had complete resolution of her sx. She was advised to follow up with her PCP to further discuss her headaches and was given contact information for Neurology If her sx return frequently and persist. This plan was discussed with the patient at the bedside and she stated both understanding and agreement.    Final Clinical Impressions(s) / ED Diagnoses   Final diagnoses:  Migraine with aura and without status migrainosus, not intractable    New Prescriptions Discharge Medication List as of 11/24/2015  9:40 PM       Zenovia Jarred, DO 11/25/15 [place], MD 11/25/15 2250

## 2016-01-21 ENCOUNTER — Inpatient Hospital Stay (HOSPITAL_COMMUNITY)
Admission: AD | Admit: 2016-01-21 | Discharge: 2016-01-21 | Disposition: A | Discharge status: Home / Self Care | Payer: Medicaid Other | Source: Ambulatory Visit | Attending: Family Medicine | Admitting: Family Medicine

## 2016-01-21 ENCOUNTER — Encounter (HOSPITAL_COMMUNITY): Payer: Self-pay | Admitting: *Deleted

## 2016-01-21 DIAGNOSIS — R102 Pelvic and perineal pain: Secondary | ICD-10-CM

## 2016-01-21 DIAGNOSIS — Z79899 Other long term (current) drug therapy: Secondary | ICD-10-CM | POA: Insufficient documentation

## 2016-01-21 DIAGNOSIS — M5432 Sciatica, left side: Secondary | ICD-10-CM

## 2016-01-21 LAB — URINALYSIS, ROUTINE W REFLEX MICROSCOPIC
Bilirubin Urine: NEGATIVE
Glucose, UA: NEGATIVE mg/dL
HGB URINE DIPSTICK: NEGATIVE
Ketones, ur: NEGATIVE mg/dL
Leukocytes, UA: NEGATIVE
Nitrite: NEGATIVE
PROTEIN: NEGATIVE mg/dL
Specific Gravity, Urine: 1.008 (ref 1.005–1.030)
pH: 7 (ref 5.0–8.0)

## 2016-01-21 LAB — WET PREP, GENITAL
CLUE CELLS WET PREP: NONE SEEN
Sperm: NONE SEEN
TRICH WET PREP: NONE SEEN
Yeast Wet Prep HPF POC: NONE SEEN

## 2016-01-21 LAB — CBC
HCT: 39.3 % (ref 36.0–46.0)
Hemoglobin: 13.1 g/dL (ref 12.0–15.0)
MCH: 28.4 pg (ref 26.0–34.0)
MCHC: 33.3 g/dL (ref 30.0–36.0)
MCV: 85.1 fL (ref 78.0–100.0)
PLATELETS: 378 10*3/uL (ref 150–400)
RBC: 4.62 MIL/uL (ref 3.87–5.11)
RDW: 15.5 % (ref 11.5–15.5)
WBC: 6 10*3/uL (ref 4.0–10.5)

## 2016-01-21 LAB — POCT PREGNANCY, URINE: PREG TEST UR: NEGATIVE

## 2016-01-21 MED ORDER — KETOROLAC TROMETHAMINE 60 MG/2ML IM SOLN
60.0000 mg | Freq: Once | INTRAMUSCULAR | Status: AC
  Administered 2016-01-21: 60 mg via INTRAMUSCULAR
  Filled 2016-01-21: qty 2

## 2016-01-21 MED ORDER — IBUPROFEN 600 MG PO TABS: 600.0000 mg | ORAL_TABLET | Freq: Four times a day (QID) | ORAL | 0 refills | Status: DC | PRN

## 2016-01-21 MED ORDER — TRAMADOL HCL 50 MG PO TABS: 50.0000 mg | ORAL_TABLET | Freq: Four times a day (QID) | ORAL | 0 refills | Status: DC | PRN

## 2016-01-21 NOTE — MAU Note (Signed)
Pain in L leg for about a week. Pain in pelvis and vagina for several days. States it is constant. No vaginal discharge or bleeding. States she has tried midol with no relief. States she had an ablation about a year ago and no longer has periods.

## 2016-01-21 NOTE — MAU Provider Note (Signed)
History     CSN: [id number]  Arrival date and time: 01/21/16 [id number]   First Provider Initiated Contact with Patient 01/21/16 [phone]      Chief Complaint  Patient presents with  . Pelvic Pain   HPI   Ms.Renee Collier is a 45 y.o. female [id number] here in MAU with pelvic pain and left leg pain X 1 month. The pain in her pelvis shoots down her left leg. Initially she thought the left leg pain was related to standing frequently at work, then the pelvic pain became worse and she became concerned. She had an ablation done 1 year ago with Dr. Harolyn Rutherford due to AUB. She has not had a period since this procedure. She denies bleeding currently. She denies a history of uterine fibroids.   OB History    Gravida Para Term Preterm AB Living   3 3 3     3    SAB TAB Ectopic Multiple Live Births                  Past Medical History:  Diagnosis Date  . Hypertension     Past Surgical History:  Procedure Laterality Date  . ENDOMETRIAL ABLATION    . HYSTEROSCOPY N/A 03/12/2014   Procedure: HYSTEROSCOPY WITH HYDROTHERMAL ABLATION;  Surgeon: Osborne Oman, MD;  Location: [facility];  Service: Gynecology;  Laterality: N/A;  . TUBAL LIGATION      Family History  Problem Relation Age of Onset  . Cancer Mother     Social History  Substance Use Topics  . Smoking status: Never Smoker  . Smokeless tobacco: Never Used  . Alcohol use No    Allergies: No Known Allergies  Prescriptions Prior to Admission  Medication Sig Dispense Refill Last Dose  . Acetaminophen-Caff-Pyrilamine (MIDOL COMPLETE PO) Take 2 tablets by mouth every 6 (six) hours as needed (for pain).   01/21/2016 at 0200   Results for orders placed or performed during the hospital encounter of 01/21/16 (from the past 72 hour(s))  GC/Chlamydia probe amp ([place])not at [facility]     Status: None   Collection Time: 01/21/16 12:00 AM  Result Value Ref Range   Chlamydia Negative     Comment: Normal Reference Range - Negative   Neisseria  gonorrhea Negative     Comment: Normal Reference Range - Negative  Urinalysis, Routine w reflex microscopic     Status: None   Collection Time: 01/21/16  9:26 AM  Result Value Ref Range   Color, Urine YELLOW YELLOW   APPearance CLEAR CLEAR   Specific Gravity, Urine 1.008 1.005 - 1.030   pH 7.0 5.0 - 8.0   Glucose, UA NEGATIVE NEGATIVE mg/dL   Hgb urine dipstick NEGATIVE NEGATIVE   Bilirubin Urine NEGATIVE NEGATIVE   Ketones, ur NEGATIVE NEGATIVE mg/dL   Protein, ur NEGATIVE NEGATIVE mg/dL   Nitrite NEGATIVE NEGATIVE   Leukocytes, UA NEGATIVE NEGATIVE  Pregnancy, urine POC     Status: None   Collection Time: 01/21/16  9:38 AM  Result Value Ref Range   Preg Test, Ur NEGATIVE NEGATIVE    Comment:        THE SENSITIVITY OF THIS METHODOLOGY IS >24 mIU/mL   Wet prep, genital     Status: Abnormal   Collection Time: 01/21/16 10:02 AM  Result Value Ref Range   Yeast Wet Prep HPF POC NONE SEEN NONE SEEN   Trich, Wet Prep NONE SEEN NONE SEEN   Clue Cells Wet Prep HPF POC NONE SEEN  NONE SEEN   WBC, Wet Prep HPF POC MODERATE (A) NONE SEEN    Comment: MODERATE BACTERIA SEEN   Sperm NONE SEEN   CBC     Status: None   Collection Time: 01/21/16 10:17 AM  Result Value Ref Range   WBC 6.0 4.0 - 10.5 K/uL   RBC 4.62 3.87 - 5.11 MIL/uL   Hemoglobin 13.1 12.0 - 15.0 g/dL   HCT 39.3 36.0 - 46.0 %   MCV 85.1 78.0 - 100.0 fL   MCH 28.4 26.0 - 34.0 pg   MCHC 33.3 30.0 - 36.0 g/dL   RDW 15.5 11.5 - 15.5 %   Platelets 378 150 - 400 K/uL    Review of Systems  Constitutional: Negative for chills and fever.  Genitourinary: Negative for dysuria and urgency.   Physical Exam   Blood pressure 139/96, pulse 82, temperature 97.8 F (36.6 C), temperature source Oral, resp. rate 18, height 5\' 2"  (1.575 m), weight 214 lb (97.1 kg).  Physical Exam  Constitutional: She is oriented to person, place, and time. She appears well-developed and well-nourished. No distress.  HENT:  Head: Normocephalic.   GI: Soft. She exhibits no distension. There is no tenderness.  Genitourinary:  Genitourinary Comments: Vagina - Small amount of white vaginal discharge, no odor  Cervix - No contact bleeding, no active bleeding  Bimanual exam: Cervix closed, no CMT  Uterus non tender, normal size Adnexa non tender, no masses bilaterally, + suprapubic tenderness  GC/Chlam, wet prep done Chaperone present for exam.   Musculoskeletal: Normal range of motion.  Neurological: She is alert and oriented to person, place, and time.  Skin: Skin is warm. She is not diaphoretic.  Psychiatric: Her behavior is normal.    MAU Course  Procedures  None  MDM  Wet prep GC  UA Toradol given IM; pain down from a 10/10 to 5/10.  WBC WNL   Assessment and Plan    A:  1. Sciatic pain, left   2. Pelvic pain in female     P:  Discharge home in stable condition Pelvic US outpatient ordered  Rx: Tramadol, ibuprofen  Return to MAU if pelvic pain worsens Follow up with Family Dr. For sciatic pain.   Lezlie Lye, NP 01/23/2016 1:36 PM

## 2016-01-21 NOTE — Discharge Instructions (Signed)
Pelvic Pain, Female °Female pelvic pain can be caused by many different things and start from a variety of places. Pelvic pain refers to pain that is located in the lower half of the abdomen and between your hips. The pain may occur over a short period of time (acute) or may be reoccurring (chronic). The cause of pelvic pain may be related to disorders affecting the female reproductive organs (gynecologic), but it may also be related to the bladder, kidney stones, an intestinal complication, or muscle or skeletal problems. Getting help right away for pelvic pain is important, especially if there has been severe, sharp, or a sudden onset of unusual pain. It is also important to get help right away because some types of pelvic pain can be life threatening.  °CAUSES  °Below are only some of the causes of pelvic pain. The causes of pelvic pain can be in one of several categories.  °· Gynecologic. °¨ Pelvic inflammatory disease. °¨ Sexually transmitted infection. °¨ Ovarian cyst or a twisted ovarian ligament (ovarian torsion). °¨ Uterine lining that grows outside the uterus (endometriosis). °¨ Fibroids, cysts, or tumors. °¨ Ovulation. °· Pregnancy. °¨ Pregnancy that occurs outside the uterus (ectopic pregnancy). °¨ Miscarriage. °¨ Labor. °¨ Abruption of the placenta or ruptured uterus. °· Infection. °¨ Uterine infection (endometritis). °¨ Bladder infection. °¨ Diverticulitis. °¨ Miscarriage related to a uterine infection (septic abortion). °· Bladder. °¨ Inflammation of the bladder (cystitis). °¨ Kidney stone(s). °· Gastrointestinal. °¨ Constipation. °¨ Diverticulitis. °· Neurologic. °¨ Trauma. °¨ Feeling pelvic pain because of mental or emotional causes (psychosomatic). °· Cancers of the bowel or pelvis. °EVALUATION  °Your caregiver will want to take a careful history of your concerns. This includes recent changes in your health, a careful gynecologic history of your periods (menses), and a sexual history. Obtaining  your family history and medical history is also important. Your caregiver may suggest a pelvic exam. A pelvic exam will help identify the location and severity of the pain. It also helps in the evaluation of which organ system may be involved. In order to identify the cause of the pelvic pain and be properly treated, your caregiver may order tests. These tests may include:  °· A pregnancy test. °· Pelvic ultrasonography. °· An X-ray exam of the abdomen. °· A urinalysis or evaluation of vaginal discharge. °· Blood tests. °HOME CARE INSTRUCTIONS  °· Only take over-the-counter or prescription medicines for pain, discomfort, or fever as directed by your caregiver.   °· Rest as directed by your caregiver.   °· Eat a balanced diet.   °· Drink enough fluids to make your urine clear or pale yellow, or as directed.   °· Avoid sexual intercourse if it causes pain.   °· Apply warm or cold compresses to the lower abdomen depending on which one helps the pain.   °· Avoid stressful situations.   °· Keep a journal of your pelvic pain. Write down when it started, where the pain is located, and if there are things that seem to be associated with the pain, such as food or your menstrual cycle. °· Follow up with your caregiver as directed.   °SEEK MEDICAL CARE IF: °· Your medicine does not help your pain. °· You have abnormal vaginal discharge. °SEEK IMMEDIATE MEDICAL CARE IF:  °· You have heavy bleeding from the vagina.   °· Your pelvic pain increases.   °· You feel light-headed or faint.   °· You have chills.   °· You have pain with urination or blood in your urine.   °· You have uncontrolled   diarrhea or vomiting.   You have a fever or persistent symptoms for more than 3 days.  You have a fever and your symptoms suddenly get worse.   You are being physically or sexually abused. This information is not intended to replace advice given to you by your health care provider. Make sure you discuss any questions you have with your  health care provider. Document Released: 12/16/2003 Document Revised: 10/09/2014 Document Reviewed: 11/08/2014 Elsevier Interactive Patient Education  2017 [employer]. Sciatica Sciatica is pain, numbness, weakness, or tingling along the path of the sciatic nerve. The sciatic nerve starts in the lower back and runs down the back of each leg. The nerve controls the muscles in the lower leg and in the back of the knee. It also provides feeling (sensation) to the back of the thigh, the lower leg, and the sole of the foot. Sciatica is a symptom of another medical condition that pinches or puts pressure on the sciatic nerve. Generally, sciatica only affects one side of the body. Sciatica usually goes away on its own or with treatment. In some cases, sciatica may keep coming back (recur). What are the causes? This condition is caused by pressure on the sciatic nerve, or pinching of the sciatic nerve. This may be the result of:  A disk in between the bones of the spine (vertebrae) bulging out too far (herniated disk).  Age-related changes in the spinal disks (degenerative disk disease).  A pain disorder that affects a muscle in the buttock (piriformis syndrome).  Extra bone growth (bone spur) near the sciatic nerve.  An injury or break (fracture) of the pelvis.  Pregnancy.  Tumor (rare). What increases the risk? The following factors may make you more likely to develop this condition:  Playing sports that place pressure or stress on the spine, such as football or weight lifting.  Having poor strength and flexibility.  A history of back injury.  A history of back surgery.  Sitting for long periods of time.  Doing activities that involve repetitive bending or lifting.  Obesity. What are the signs or symptoms? Symptoms can vary from mild to very severe, and they may include:  Any of these problems in the lower back, leg, hip, or buttock:  Mild tingling or dull aches.  Burning  sensations.  Sharp pains.  Numbness in the back of the calf or the sole of the foot.  Leg weakness.  Severe back pain that makes movement difficult. These symptoms may get worse when you cough, sneeze, or laugh, or when you sit or stand for long periods of time. Being overweight may also make symptoms worse. In some cases, symptoms may recur over time. How is this diagnosed? This condition may be diagnosed based on:  Your symptoms.  A physical exam. Your health care provider may ask you to do certain movements to check whether those movements trigger your symptoms.  You may have tests, including:  Blood tests.  X-rays.  MRI.  CT scan. How is this treated? In many cases, this condition improves on its own, without any treatment. However, treatment may include:  Reducing or modifying physical activity during periods of pain.  Exercising and stretching to strengthen your abdomen and improve the flexibility of your spine.  Icing and applying heat to the affected area.  Medicines that help:  To relieve pain and swelling.  To relax your muscles.  Injections of medicines that help to relieve pain, irritation, and inflammation around the sciatic nerve (steroids).  Surgery. Follow these instructions at home: Medicines  Take over-the-counter and prescription medicines only as told by your health care provider.  Do not drive or operate heavy machinery while taking prescription pain medicine. Managing pain  If directed, apply ice to the affected area.  Put ice in a plastic bag.  Place a towel between your skin and the bag.  Leave the ice on for 20 minutes, 2-3 times a day.  After icing, apply heat to the affected area before you exercise or as often as told by your health care provider. Use the heat source that your health care provider recommends, such as a moist heat pack or a heating pad.  Place a towel between your skin and the heat source.  Leave the heat on  for 20-30 minutes.  Remove the heat if your skin turns bright red. This is especially important if you are unable to feel pain, heat, or cold. You may have a greater risk of getting burned. Activity  Return to your normal activities as told by your health care provider. Ask your health care provider what activities are safe for you.  Avoid activities that make your symptoms worse.  Take brief periods of rest throughout the day. Resting in a lying or standing position is usually better than sitting to rest.  When you rest for longer periods, mix in some mild activity or stretching between periods of rest. This will help to prevent stiffness and pain.  Avoid sitting for long periods of time without moving. Get up and move around at least one time each hour.  Exercise and stretch regularly, as told by your health care provider.  Do not lift anything that is heavier than 10 lb (4.5 kg) while you have symptoms of sciatica. When you do not have symptoms, you should still avoid heavy lifting, especially repetitive heavy lifting.  When you lift objects, always use proper lifting technique, which includes:  Bending your knees.  Keeping the load close to your body.  Avoiding twisting. General instructions  Use good posture.  Avoid leaning forward while sitting.  Avoid hunching over while standing.  Maintain a healthy weight. Excess weight puts extra stress on your back and makes it difficult to maintain good posture.  Wear supportive, comfortable shoes. Avoid wearing high heels.  Avoid sleeping on a mattress that is too soft or too hard. A mattress that is firm enough to support your back when you sleep may help to reduce your pain.  Keep all follow-up visits as told by your health care provider. This is important. Contact a health care provider if:  You have pain that wakes you up when you are sleeping.  You have pain that gets worse when you lie down.  Your pain is worse than  you have experienced in the past.  Your pain lasts longer than 4 weeks.  You experience unexplained weight loss. Get help right away if:  You lose control of your bowel or bladder (incontinence).  You have:  Weakness in your lower back, pelvis, buttocks, or legs that gets worse.  Redness or swelling of your back.  A burning sensation when you urinate. This information is not intended to replace advice given to you by your health care provider. Make sure you discuss any questions you have with your health care provider. Document Released: 01/12/2001 Document Revised: 06/24/2015 Document Reviewed: 09/27/2014 Elsevier Interactive Patient Education  2017 [employer].

## 2016-01-22 LAB — GC/CHLAMYDIA PROBE AMP (~~LOC~~) NOT AT ARMC
CHLAMYDIA, DNA PROBE: NEGATIVE
NEISSERIA GONORRHEA: NEGATIVE

## 2016-01-28 ENCOUNTER — Ambulatory Visit (HOSPITAL_COMMUNITY): Payer: Medicaid Other | Attending: Obstetrics and Gynecology

## 2016-02-01 IMAGING — US US PELVIS COMPLETE
1 series · 13 of 25 positions shown · non-contrast
Comparison: 04/18/2007

CLINICAL DATA: Dysfunctional uterine bleeding with clots

EXAM:
TRANSABDOMINAL AND TRANSVAGINAL ULTRASOUND OF PELVIS
TECHNIQUE: Both transabdominal and transvaginal ultrasound examinations of the
pelvis were performed. Transabdominal technique was performed for
global imaging of the pelvis including uterus, ovaries, adnexal
regions, and pelvic cul-de-sac. It was necessary to proceed with
endovaginal exam following the transabdominal exam to visualize the
endometrium and bilateral ovaries.

[Series 1: us pelvis complete · 13 of 48 slices shown]
[im 1/48]
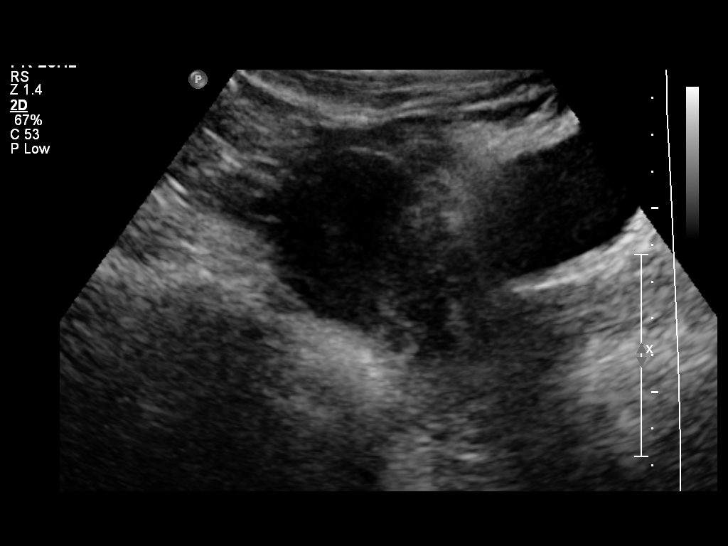
[im 4/48]
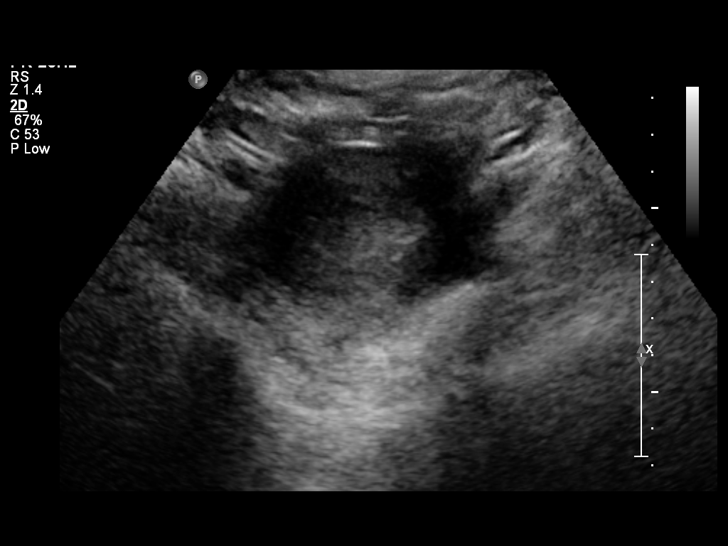
[im 8/48]
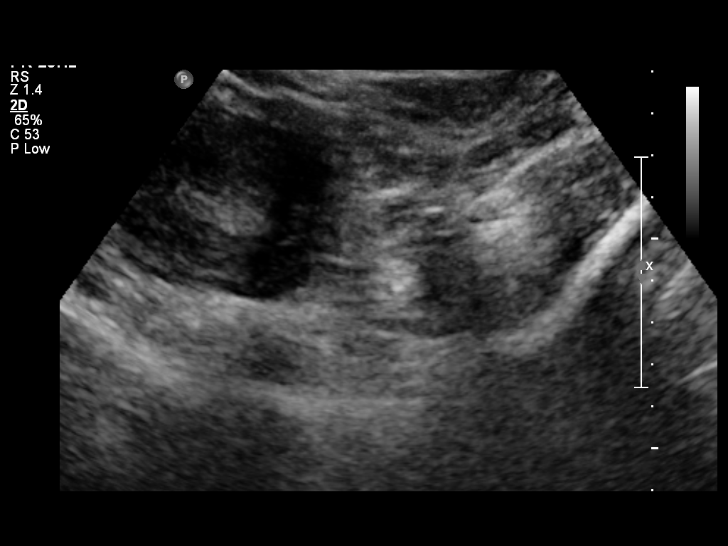
[im 12/48]
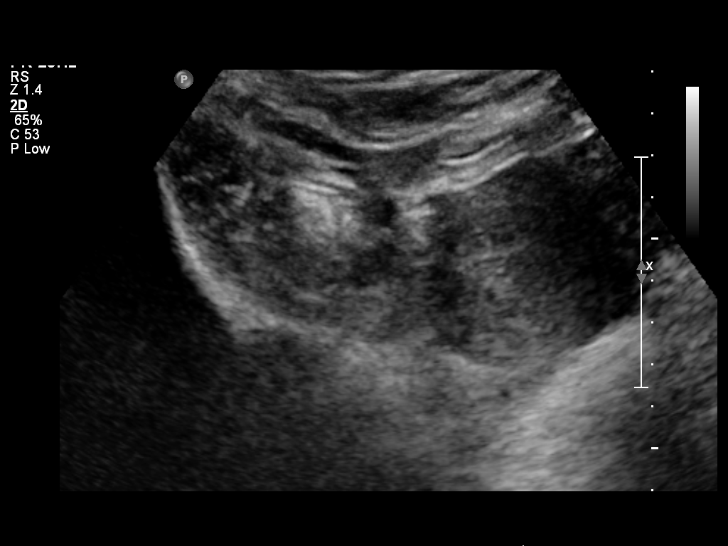
[im 16/48]
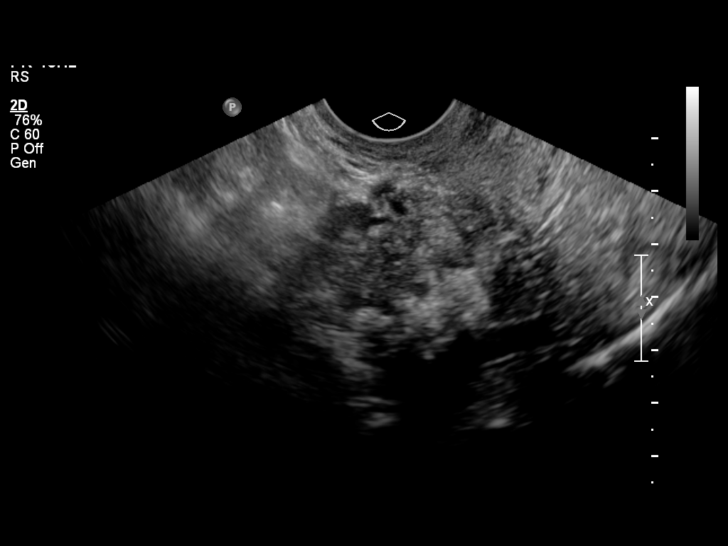
[im 20/48]
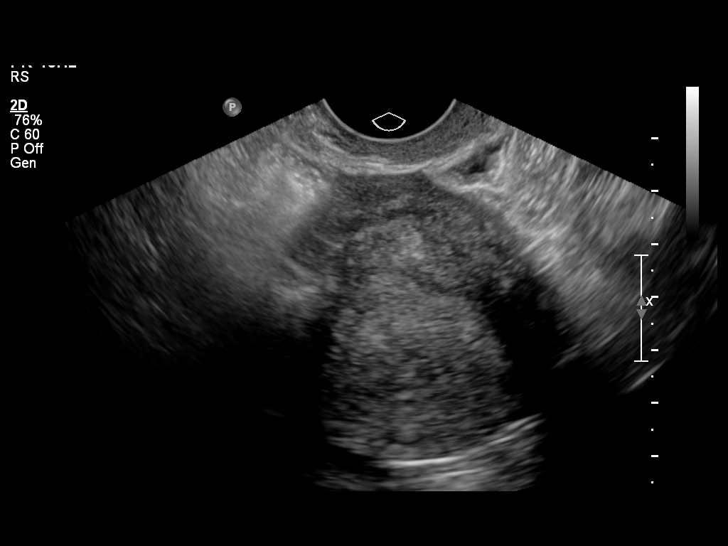
[im 24/48]
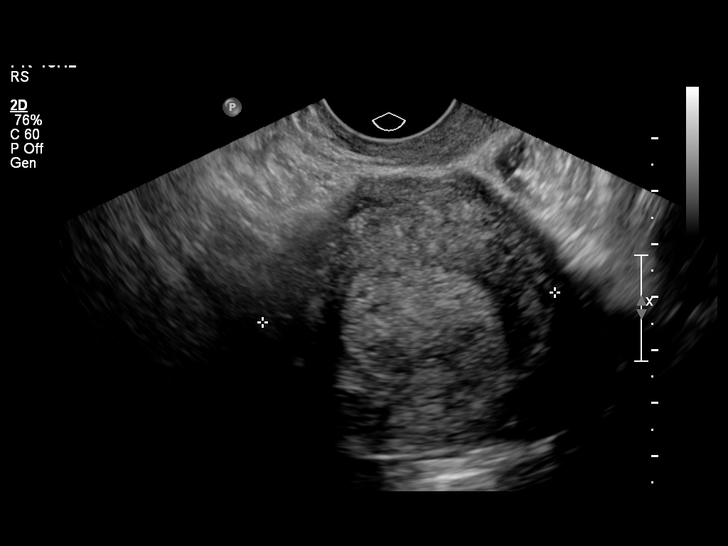
[im 28/48]
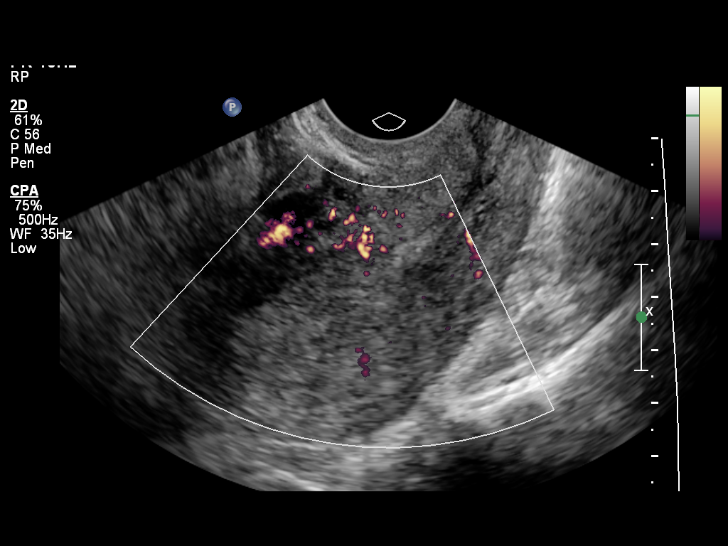
[im 32/48]
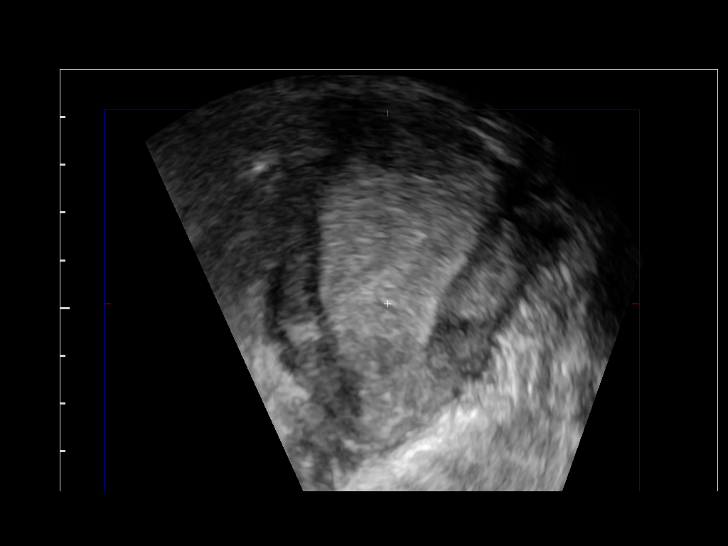
[im 36/48]
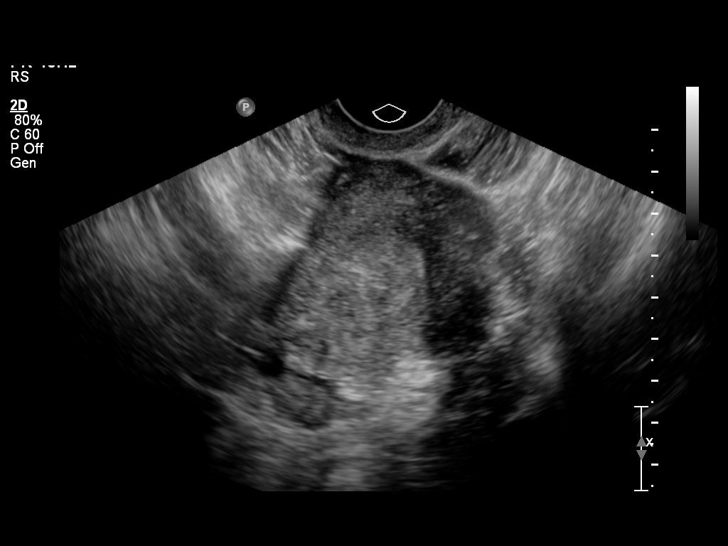
[im 40/48]
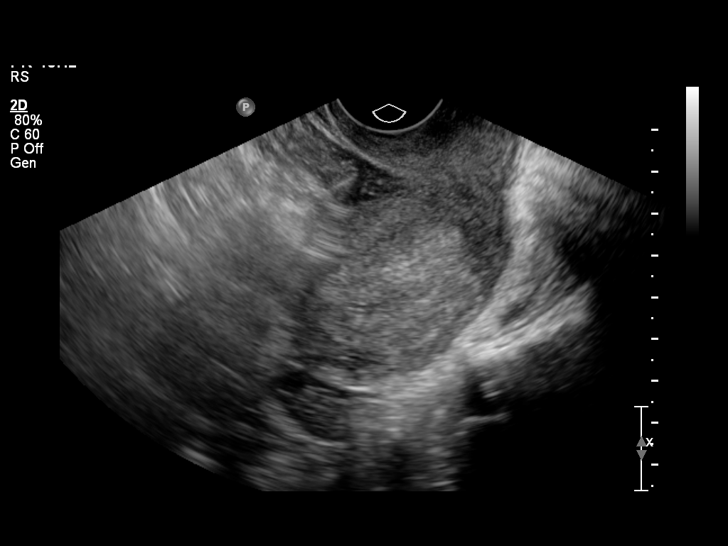
[im 44/48]
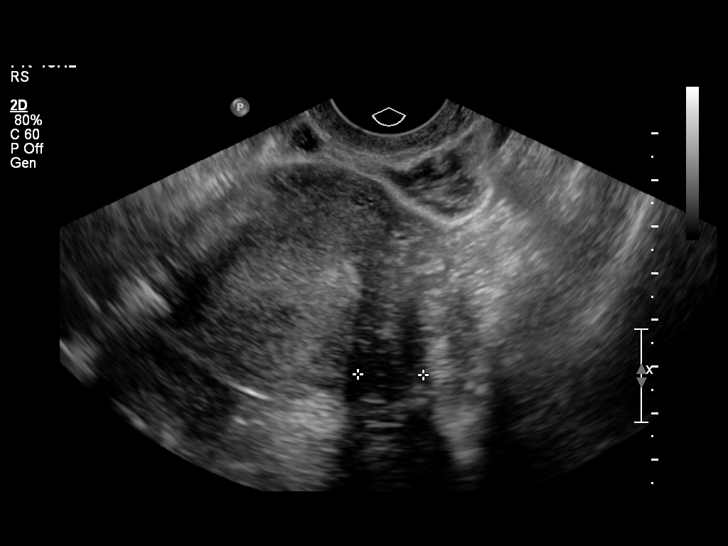
[im 48/48]
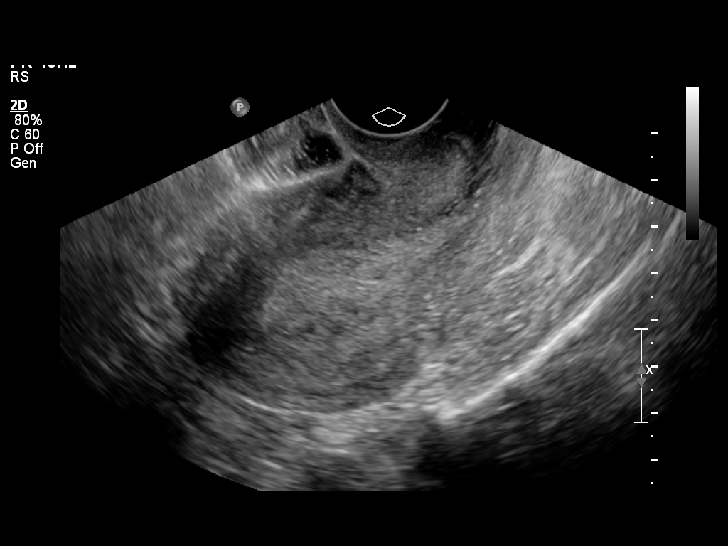

[13 of 25 positions shown; findings below may reference images not displayed]

FINDINGS: Uterus

Measurements: 9.3 x 4.8 x 5.5 cm. No fibroids or other mass
visualized.

Endometrium

Thickness: 22 mm. Thickened/heterogeneous with associated focal
color Doppler flow (image 28).

Right ovary

Measurements: 2.7 x 1.7 x 1.9 cm. Normal appearance/no adnexal mass.

Left ovary

Measurements: 2.1 x 1.3 x 1.4 cm. Normal appearance/no adnexal mass.

Other findings

No free fluid.
IMPRESSION: Endometrium is thickened/heterogeneous, measuring 22 mm, with
associated vascularity.

If bleeding remains unresponsive to hormonal or medical therapy,
focal lesion work-up with sonohysterogram should be considered.
Endometrial biopsy should also be considered in pre-menopausal
patients at high risk for endometrial carcinoma.

(Ref: Radiological Reasoning: Algorithmic Workup of Abnormal Vaginal
Bleeding with Endovaginal Sonography and Sonohysterography. AJR
3334; 191:S68-73)

## 2016-02-09 ENCOUNTER — Ambulatory Visit: Payer: Medicaid Other

## 2016-10-21 ENCOUNTER — Emergency Department (HOSPITAL_COMMUNITY)
Admission: EM | Admit: 2016-10-21 | Discharge: 2016-10-21 | Disposition: A | Discharge status: Home / Self Care | Payer: Self-pay | Attending: Emergency Medicine | Admitting: Emergency Medicine

## 2016-10-21 ENCOUNTER — Emergency Department (HOSPITAL_COMMUNITY): Payer: Self-pay

## 2016-10-21 DIAGNOSIS — M79601 Pain in right arm: Secondary | ICD-10-CM

## 2016-10-21 DIAGNOSIS — I10 Essential (primary) hypertension: Secondary | ICD-10-CM | POA: Insufficient documentation

## 2016-10-21 MED ORDER — IBUPROFEN 600 MG PO TABS: 600.0000 mg | ORAL_TABLET | Freq: Four times a day (QID) | ORAL | 0 refills | Status: DC | PRN

## 2016-10-21 NOTE — ED Triage Notes (Signed)
Pt reports right forearm pain x 3 days. Denies relatable injury or other sx. No redness or visible swelling. Radial pulse present. Pt also requesting BP med refill.

## 2016-10-21 NOTE — ED Provider Notes (Signed)
[facility] DEPT Provider Note   CSN: [id number] Arrival date & time: 10/21/16  1343     History   Chief Complaint Chief Complaint  Patient presents with  . Arm Pain    HPI Renee Collier is a 46 y.o. female.  The history is provided by the patient. No language interpreter was used.  Arm Pain  This is a new problem. The current episode started more than 2 days ago. The problem occurs constantly. The problem has been gradually worsening. Nothing aggravates the symptoms. Nothing relieves the symptoms. She has tried nothing for the symptoms. The treatment provided no relief.  Pt complains of pain to her right forearm.  Pt reports pain began yesterday while working. Pt is right handed and does lifting and stocking  Past Medical History:  Diagnosis Date  . Hypertension     Patient Active Problem List   Diagnosis Date Noted  . Status post HTA endometrial ablation 04/10/2014  . Abnormal uterine bleeding (AUB) 12/18/2013    Past Surgical History:  Procedure Laterality Date  . ENDOMETRIAL ABLATION    . HYSTEROSCOPY N/A 03/12/2014   Procedure: HYSTEROSCOPY WITH HYDROTHERMAL ABLATION;  Surgeon: Osborne Oman, MD;  Location: [facility];  Service: Gynecology;  Laterality: N/A;  . TUBAL LIGATION      OB History    Gravida Para Term Preterm AB Living   3 3 3     3    SAB TAB Ectopic Multiple Live Births                   Home Medications    Prior to Admission medications   Medication Sig Start Date End Date Taking? Authorizing Provider  ibuprofen (ADVIL,MOTRIN) 600 MG tablet Take 1 tablet (600 mg total) by mouth every 6 (six) hours as needed. 10/21/16   Fransico Meadow, PA-C  traMADol (ULTRAM) 50 MG tablet Take 1 tablet (50 mg total) by mouth every 6 (six) hours as needed. 01/21/16   Rasch, Artist Pais, NP    Family History Family History  Problem Relation Age of Onset  . Cancer Mother     Social History Social History  Substance Use Topics  . Smoking status: Never  Smoker  . Smokeless tobacco: Never Used  . Alcohol use No     Allergies   Patient has no known allergies.   Review of Systems Review of Systems  Musculoskeletal: Positive for myalgias.  All other systems reviewed and are negative.    Physical Exam Updated Vital Signs BP (!) 155/97 (BP Location: Left Arm)   Pulse 89   Temp 98.2 F (36.8 C) (Oral)   Resp 16   SpO2 99%   Physical Exam  Constitutional: She appears well-developed and well-nourished.  Musculoskeletal: She exhibits tenderness.  Tender right forearm,  Pain with pronation and supination,  nv and ns intact  Neurological: She is alert.  Skin: Skin is warm.  Psychiatric: She has a normal mood and affect.  Nursing note and vitals reviewed.    ED Treatments / Results  Labs (all labs ordered are listed, but only abnormal results are displayed) Labs Reviewed - No data to display  EKG  EKG Interpretation None       Radiology Dg Forearm Right  Result Date: 10/21/2016 CLINICAL DATA:  Diffuse forearm pain on the right for 3 days. No known injury. EXAM: RIGHT FOREARM - 2 VIEW COMPARISON:  None. FINDINGS: There is no evidence of fracture or other focal bone lesions. Soft tissues  are unremarkable. IMPRESSION: Negative. Electronically Signed   By: Monte Fantasia M.D.   On: 10/21/2016 15:04    Procedures Procedures (including critical care time)  Medications Ordered in ED Medications - No data to display   Initial Impression / Assessment and Plan / ED Course  I have reviewed the triage vital signs and the nursing notes.  Pertinent labs & imaging results that were available during my care of the patient were reviewed by me and considered in my medical decision making (see chart for details).     Pt placed in a splint Ice to area, Ibuprofen OOW x 2 days An After Visit Summary was printed and given to the patient.   Final Clinical Impressions(s) / ED Diagnoses   Final diagnoses:  Right arm pain     New Prescriptions Discharge Medication List as of 10/21/2016  4:53 PM       Fransico Meadow, PA-C 10/21/16 1752    Dorie Rank, MD 10/22/16 [phone]

## 2016-10-21 NOTE — Discharge Instructions (Signed)
Return if any problems.  See the Orthopaedist for recheck in 1 week  °

## 2016-11-10 NOTE — Progress Notes (Signed)
Patient ID: Renee Collier, female   DOB: 26-Mar-1970, 46 y.o.   MRN: [id number]     Renee Collier, is a 46 y.o. female  NUU:[id number]  HKV:[id number]  DOB - April 10, 1970  Subjective:  Chief Complaint and HPI: Renee Collier is a 46 y.o. female here today to establish care and for a follow up visit  After being seen in the ED 10/21/2016 for R arm pain.  NKI.  R arm became sore after unloading a truck.  No numbness or weakness.  Xrays negative.  Arm still causing her pain.  Works 2 jobs-[employer] and [employer].  Does lots of lifting.  She is R hand dominant.  Xrays were negative.    Also c/o L jaw/ear pain.  Admits to +stress.  Unsure if she grinds her teeth at night.  No f/c.    HA almost daily- throbbing.  No vision changes.  No thunderclap.  OTC usu help.  Hasn't been on BP meds in a long time. No CP.  No dizziness.    ED/Hospital notes reviewed.   Social History:  Works 2 jobs  ROS:   Constitutional:  No f/c, No night sweats, No unexplained weight loss. EENT:  No vision changes, No blurry vision, No hearing changes. No mouth, throat, or ear problems.  Respiratory: No cough, No SOB Cardiac: No CP, no palpitations GI:  No abd pain, No N/V/D. GU: No Urinary s/sx Musculoskeletal: R forearm pain, L jaw pain Neuro: + headache, no dizziness, no motor weakness.  Skin: No rash Endocrine:  No polydipsia. No polyuria.  Psych: Denies SI/HI  No problems updated.  ALLERGIES: No Known Allergies  PAST MEDICAL HISTORY: Past Medical History:  Diagnosis Date  . Hypertension     MEDICATIONS AT HOME: Prior to Admission medications   Medication Sig Start Date End Date Taking? Authorizing Provider  hydrochlorothiazide (HYDRODIURIL) 25 MG tablet Take 1 tablet (25 mg total) by mouth daily. 11/11/16   Argentina Donovan, PA-C  naproxen (NAPROSYN) 500 MG tablet Take 1 tablet (500 mg total) by mouth 2 (two) times daily with a meal. X 10 days then prn pain 11/11/16   [name] M, PA-C  traMADol  (ULTRAM) 50 MG tablet Take 1 tablet (50 mg total) by mouth every 6 (six) hours as needed. Patient not taking: Reported on 11/11/2016 01/21/16   Rasch, Anderson Malta I, NP     Objective:  EXAM:   Vitals:   11/11/16 1017  BP: (!) 162/96  Pulse: 90  Resp: 18  Temp: 98.7 F (37.1 C)  TempSrc: Oral  SpO2: 96%  Weight: 222 lb 6.4 oz (100.9 kg)  Height: 5\' 4"  (1.626 m)    General appearance : A&OX3. NAD. Non-toxic-appearing HEENT: Atraumatic and Normocephalic.  PERRLA. EOM intact.  TM clear B. Mouth-MMM, post pharynx WNL w/o erythema, No PND. L TMJ is TTP Neck: supple, no JVD. No cervical lymphadenopathy. No thyromegaly Chest/Lungs:  Breathing-non-labored, Good air entry bilaterally, breath sounds normal without rales, rhonchi, or wheezing  CVS: S1 S2 regular, no murmurs, gallops, rubs  R forearm, elbow, wrist and hand examined.  No gross abnormality.  No atrophy/asymmetry.  No point TTP. (general "soreness" over entire mid flexor surface of forearm).  Normal S&ROM of arm/elbow/wrist/hand/fingers.   Extremities: Bilateral Lower Ext shows no edema, both legs are warm to touch with = pulse throughout Neurology:  CN II-XII grossly intact, Non focal.  No facial asymmetry.  Finger to nose/heel to shin intact.   Psych:  TP linear. J/I  WNL. Normal speech. Appropriate eye contact and affect.  Skin:  No Rash  Data Review No results found for: HGBA1C   Assessment & Plan   1. Hypertension, unspecified type Uncontrolled.  Check BP 3 to 7 times/week and record.  improving her BP should help with HA.   start- hydrochlorothiazide (HYDRODIURIL) 25 MG tablet; Take 1 tablet (25 mg total) by mouth daily.  Dispense: 90 tablet; Refill: 3  2. Right arm pain Unsure etiology/strain/?overuse. Ace wraps X 2 given for comfort.   - naproxen (NAPROSYN) 500 MG tablet; Take 1 tablet (500 mg total) by mouth 2 (two) times daily with a meal. X 10 days then prn pain  Dispense: 60 tablet; Refill: 0  3. Pain in lower  jaw Likely due to bruxism.  Will give information.  Mouth guard may help.  - naproxen (NAPROSYN) 500 MG tablet; Take 1 tablet (500 mg total) by mouth 2 (two) times daily with a meal. X 10 days then prn pain  Dispense: 60 tablet; Refill: 0  4.  Headaches-likely secondary to uncontrolled htn No red flags  Patient have been counseled extensively about nutrition and exercise  Return in about 1 month (around 12/12/2016) for assign new PCP; f/up htn, R arm pain and do labs.  The patient was given clear instructions to go to ER or return to medical center if symptoms don't improve, worsen or new problems develop. The patient verbalized understanding. The patient was told to call to get lab results if they haven't heard anything in the next week.     [name], PA-C [facility] [place], [place]   11/11/2016, 10:33 AM

## 2016-11-11 ENCOUNTER — Encounter: Payer: Self-pay | Admitting: Physician Assistant

## 2016-11-11 ENCOUNTER — Ambulatory Visit: Payer: Self-pay | Attending: Internal Medicine | Admitting: Physician Assistant

## 2016-11-11 VITALS — BP 162/96 | HR 90 | Temp 98.7°F | Resp 18 | Ht 64.0 in | Wt 222.4 lb

## 2016-11-11 DIAGNOSIS — R519 Headache, unspecified: Secondary | ICD-10-CM

## 2016-11-11 DIAGNOSIS — I10 Essential (primary) hypertension: Secondary | ICD-10-CM | POA: Insufficient documentation

## 2016-11-11 DIAGNOSIS — R6884 Jaw pain: Secondary | ICD-10-CM | POA: Insufficient documentation

## 2016-11-11 DIAGNOSIS — R51 Headache: Secondary | ICD-10-CM | POA: Insufficient documentation

## 2016-11-11 DIAGNOSIS — M79601 Pain in right arm: Secondary | ICD-10-CM | POA: Insufficient documentation

## 2016-11-11 DIAGNOSIS — Z09 Encounter for follow-up examination after completed treatment for conditions other than malignant neoplasm: Secondary | ICD-10-CM | POA: Insufficient documentation

## 2016-11-11 MED ORDER — HYDROCHLOROTHIAZIDE 25 MG PO TABS: 25.0000 mg | ORAL_TABLET | Freq: Every day | ORAL | 3 refills | Status: DC

## 2016-11-11 MED ORDER — NAPROXEN 500 MG PO TABS: 500.0000 mg | ORAL_TABLET | Freq: Two times a day (BID) | ORAL | 0 refills | Status: DC

## 2016-11-11 MED FILL — HYDROCHLOROTHIAZIDE 25 MG T: 25 | 30 days supply | Qty: 30 | Fill #0

## 2016-11-11 MED FILL — NAPROXEN 500 MG TABLET: 500 | 30 days supply | Qty: 60 | Fill #0

## 2016-11-11 NOTE — Patient Instructions (Signed)
Check blood pressure at least 3 times weekly and record and bring to next visit.     Temporomandibular Joint Syndrome Temporomandibular joint (TMJ) syndrome is a condition that affects the joints between your jaw and your skull. The TMJs are located near your ears and allow your jaw to open and close. These joints and the nearby muscles are involved in all movements of the jaw. People with TMJ syndrome have pain in the area of these joints and muscles. Chewing, biting, or other movements of the jaw can be difficult or painful. TMJ syndrome can be caused by various things. In many cases, the condition is mild and goes away within a few weeks. For some people, the condition can become a long-term problem. What are the causes? Possible causes of TMJ syndrome include:  Grinding your teeth or clenching your jaw. Some people do this when they are under stress.  Arthritis.  Injury to the jaw.  Head or neck injury.  Teeth or dentures that are not aligned well.  In some cases, the cause of TMJ syndrome may not be known. What are the signs or symptoms? The most common symptom is an aching pain on the side of the head in the area of the TMJ. Other symptoms may include:  Pain when moving your jaw, such as when chewing or biting.  Being unable to open your jaw all the way.  Making a clicking sound when you open your mouth.  Headache.  Earache.  Neck or shoulder pain.  How is this diagnosed? Diagnosis can usually be made based on your symptoms, your medical history, and a physical exam. Your health care provider may check the range of motion of your jaw. Imaging tests, such as X-rays or an MRI, are sometimes done. You may need to see your dentist to determine if your teeth and jaw are lined up correctly. How is this treated? TMJ syndrome often goes away on its own. If treatment is needed, the options may include:  Eating soft foods and applying ice or heat.  Medicines to relieve pain or  inflammation.  Medicines to relax the muscles.  A splint, bite plate, or mouthpiece to prevent teeth grinding or jaw clenching.  Relaxation techniques or counseling to help reduce stress.  Transcutaneous electrical nerve stimulation (TENS). This helps to relieve pain by applying an electrical current through the skin.  Acupuncture. This is sometimes helpful to relieve pain.  Jaw surgery. This is rarely needed.  Follow these instructions at home:  Take medicines only as directed by your health care provider.  Eat a soft diet if you are having trouble chewing.  Apply ice to the painful area. ? Put ice in a plastic bag. ? Place a towel between your skin and the bag. ? Leave the ice on for 20 minutes, 2-3 times a day.  Apply a warm compress to the painful area as directed.  Massage your jaw area and perform any jaw stretching exercises as recommended by your health care provider.  If you were given a mouthpiece or bite plate, wear it as directed.  Avoid foods that require a lot of chewing. Do not chew gum.  Keep all follow-up visits as directed by your health care provider. This is important. Contact a health care provider if:  You are having trouble eating.  You have new or worsening symptoms. Get help right away if:  Your jaw locks open or closed. This information is not intended to replace advice given to you  by your health care provider. Make sure you discuss any questions you have with your health care provider. Document Released: 10/13/2000 Document Revised: 09/18/2015 Document Reviewed: 08/23/2013 Elsevier Interactive Patient Education  [employer].

## 2016-11-16 ENCOUNTER — Telehealth: Payer: Self-pay | Admitting: *Deleted

## 2016-11-16 ENCOUNTER — Encounter: Payer: Self-pay | Admitting: *Deleted

## 2016-11-16 NOTE — Telephone Encounter (Signed)
Letter has been drafted and faxed. Placed at the front desk for hard copy pick up.

## 2016-11-16 NOTE — Telephone Encounter (Signed)
Last OV on 11/11/2016. She states she needs a work note faxed to her employer stating it is ok for her to come back to work.  Please fax this to [employer] at [phone]

## 2016-11-16 NOTE — Telephone Encounter (Signed)
This is fine.  Please find out and provide whatever she needs. Thanks, [name], PA-C

## 2016-11-22 ENCOUNTER — Ambulatory Visit: Payer: Medicaid Other | Attending: Internal Medicine

## 2016-12-14 ENCOUNTER — Ambulatory Visit: Payer: Self-pay | Attending: Family Medicine | Admitting: Family Medicine

## 2016-12-14 ENCOUNTER — Encounter: Payer: Self-pay | Admitting: Family Medicine

## 2016-12-14 VITALS — BP 118/79 | HR 85 | Temp 98.7°F | Resp 18 | Ht 64.0 in | Wt 222.0 lb

## 2016-12-14 DIAGNOSIS — I10 Essential (primary) hypertension: Secondary | ICD-10-CM | POA: Insufficient documentation

## 2016-12-14 DIAGNOSIS — M791 Myalgia, unspecified site: Secondary | ICD-10-CM | POA: Insufficient documentation

## 2016-12-14 DIAGNOSIS — Z79899 Other long term (current) drug therapy: Secondary | ICD-10-CM | POA: Insufficient documentation

## 2016-12-14 DIAGNOSIS — Z23 Encounter for immunization: Secondary | ICD-10-CM | POA: Insufficient documentation

## 2016-12-14 DIAGNOSIS — N920 Excessive and frequent menstruation with regular cycle: Secondary | ICD-10-CM | POA: Insufficient documentation

## 2016-12-14 DIAGNOSIS — M79631 Pain in right forearm: Secondary | ICD-10-CM

## 2016-12-14 DIAGNOSIS — R5383 Other fatigue: Secondary | ICD-10-CM | POA: Insufficient documentation

## 2016-12-14 DIAGNOSIS — M79601 Pain in right arm: Secondary | ICD-10-CM | POA: Insufficient documentation

## 2016-12-14 MED ORDER — METHOCARBAMOL 500 MG PO TABS: 500.0000 mg | ORAL_TABLET | Freq: Three times a day (TID) | ORAL | 0 refills | Status: DC | PRN

## 2016-12-14 NOTE — Progress Notes (Signed)
Patient is here for f/up HTN 

## 2016-12-14 NOTE — Progress Notes (Signed)
Subjective:  Patient ID: Renee Collier, female    DOB: 1971-01-16  Age: 46 y.o. MRN: [id number]  CC: Hypertension   HPI Renee Collier presents to establish care for HTN.  She is not exercising and is not adherent to low salt diet. She does not check BP at home. Cardiac symptoms none. Patient denies chest pain, chest pressure/discomfort, dyspnea, lower extremity edema, near-syncope, palpitations and syncope.  Cardiovascular risk factors: hypertension and sedentary lifestyle. Use of agents associated with hypertension: none. History of target organ damage: none. Fatigue: Patient describes the following psychologic symptoms: stress at work and stress in the family.  Patient denies significant change in weight and GI blood loss. She does report history of heavy menstrual cycles. Severity has been symptoms bothersome, but easily able to carry out all usual work/school/family activities. Right arm pain: Onset 8 weeks ago. History of ED visit for complaint. Pain is located in right forearm, is described as aching, and is intermittent .  Associated symptoms include: none.  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 She is taking muscle relaxant for symptoms. She reports having naprosyn available but does not take.  Related to injury:   no.    Outpatient Medications Prior to Visit  Medication Sig Dispense Refill  . hydrochlorothiazide (HYDRODIURIL) 25 MG tablet Take 1 tablet (25 mg total) by mouth daily. 90 tablet 3  . naproxen (NAPROSYN) 500 MG tablet Take 1 tablet (500 mg total) by mouth 2 (two) times daily with a meal. X 10 days then prn pain (Patient not taking: Reported on 12/14/2016) 60 tablet 0  . traMADol (ULTRAM) 50 MG tablet Take 1 tablet (50 mg total) by mouth every 6 (six) hours as needed. (Patient not taking: Reported on 11/11/2016) 10 tablet 0   No facility-administered medications prior to visit.     ROS Review of Systems  Constitutional: Positive for fatigue.    Respiratory: Negative.   Cardiovascular: Negative.   Gastrointestinal: Negative.   Musculoskeletal: Positive for myalgias.  Skin: Negative.     Objective:  BP 118/79 (BP Location: Left Arm, Patient Position: Sitting, Cuff Size: Normal)   Pulse 85   Temp 98.7 F (37.1 C) (Oral)   Resp 18   Ht 5\' 4"  (1.626 m)   Wt 222 lb (100.7 kg)   SpO2 97%   BMI 38.11 kg/m   BP/Weight 12/14/2016 11/11/2016 0/63/0160  Systolic BP 109 323 557  Diastolic BP 79 96 97  Wt. (Lbs) 222 222.4 -  BMI 38.11 38.17 -     Physical Exam  Constitutional: She appears well-developed.  HENT:  Head: Normocephalic and atraumatic.  Right Ear: External ear normal.  Left Ear: External ear normal.  Nose: Nose normal.  Mouth/Throat: Oropharynx is clear and moist.  Eyes: Conjunctivae are normal. Pupils are equal, round, and reactive to light.  Neck: Normal range of motion. Neck supple. No JVD present.  Cardiovascular: Normal rate, regular rhythm, normal heart sounds and intact distal pulses.  Pulmonary/Chest: Effort normal and breath sounds normal.  Abdominal: Soft. Bowel sounds are normal. There is no tenderness.  Musculoskeletal:       Right forearm: She exhibits tenderness (generalized; no erythema or increased warmth.). She exhibits no swelling and no edema.  Skin: Skin is warm and dry.  Psychiatric: She has a normal mood and affect.  Nursing note and vitals reviewed.    Assessment & Plan:   1. Needs flu shot  - Flu Vaccine QUAD 6+ mos PF  IM (Fluarix Quad PF)  2. Essential hypertension  - CMP and Liver  3. Fatigue, unspecified type  - TSH - CBC  4. Right forearm pain Encouraged use of NSAID's, muscle relaxants, and warm therapy to help with symptoms. Brace provided.  - methocarbamol (ROBAXIN) 500 MG tablet; Take 1 tablet (500 mg total) every 8 (eight) hours as needed by mouth for muscle spasms.  Dispense: 30 tablet; Refill: 0 - Wrist brace  5. Myalgia  - methocarbamol (ROBAXIN) 500  MG tablet; Take 1 tablet (500 mg total) every 8 (eight) hours as needed by mouth for muscle spasms.  Dispense: 30 tablet; Refill: 0     Follow-up: Return in about 2 weeks (around 12/28/2016) for Annual physical.   Alfonse Spruce FNP

## 2016-12-14 NOTE — Patient Instructions (Addendum)
Hypertension Hypertension is another name for high blood pressure. High blood pressure forces your heart to work harder to pump blood. This can cause problems over time. There are two numbers in a blood pressure reading. There is a top number (systolic) over a bottom number (diastolic). It is best to have a blood pressure below 120/80. Healthy choices can help lower your blood pressure. You may need medicine to help lower your blood pressure if:  Your blood pressure cannot be lowered with healthy choices.  Your blood pressure is higher than 130/80.  Follow these instructions at home: Eating and drinking  If directed, follow the DASH eating plan. This diet includes: ? Filling half of your plate at each meal with fruits and vegetables. ? Filling one quarter of your plate at each meal with whole grains. Whole grains include whole wheat pasta, brown rice, and whole grain bread. ? Eating or drinking low-fat dairy products, such as skim milk or low-fat yogurt. ? Filling one quarter of your plate at each meal with low-fat (lean) proteins. Low-fat proteins include fish, skinless chicken, eggs, beans, and tofu. ? Avoiding fatty meat, cured and processed meat, or chicken with skin. ? Avoiding premade or processed food.  Eat less than 1,500 mg of salt (sodium) a day.  Limit alcohol use to no more than 1 drink a day for nonpregnant women and 2 drinks a day for men. One drink equals 12 oz of beer, 5 oz of wine, or 1 oz of hard liquor. Lifestyle  Work with your doctor to stay at a healthy weight or to lose weight. Ask your doctor what the best weight is for you.  Get at least 30 minutes of exercise that causes your heart to beat faster (aerobic exercise) most days of the week. This may include walking, swimming, or biking.  Get at least 30 minutes of exercise that strengthens your muscles (resistance exercise) at least 3 days a week. This may include lifting weights or pilates.  Do not use any  products that contain nicotine or tobacco. This includes cigarettes and e-cigarettes. If you need help quitting, ask your doctor.  Check your blood pressure at home as told by your doctor.  Keep all follow-up visits as told by your doctor. This is important. Medicines  Take over-the-counter and prescription medicines only as told by your doctor. Follow directions carefully.  Do not skip doses of blood pressure medicine. The medicine does not work as well if you skip doses. Skipping doses also puts you at risk for problems.  Ask your doctor about side effects or reactions to medicines that you should watch for. Contact a doctor if:  You think you are having a reaction to the medicine you are taking.  You have headaches that keep coming back (recurring).  You feel dizzy.  You have swelling in your ankles.  You have trouble with your vision. Get help right away if:  You get a very bad headache.  You start to feel confused.  You feel weak or numb.  You feel faint.  You get very bad pain in your: ? Chest. ? Belly (abdomen).  You throw up (vomit) more than once.  You have trouble breathing. Summary  Hypertension is another name for high blood pressure.  Making healthy choices can help lower blood pressure. If your blood pressure cannot be controlled with healthy choices, you may need to take medicine. This information is not intended to replace advice given to you by your health care   provider. Make sure you discuss any questions you have with your health care provider. Document Released: 07/07/2007 Document Revised: 12/17/2015 Document Reviewed: 12/17/2015 Elsevier Interactive Patient Education  2018 [employer].   Musculoskeletal Pain Musculoskeletal pain is muscle and bone aches and pains. This pain can occur in any part of the body. Follow these instructions at home:  Only take medicines for pain, discomfort, or fever as told by your health care provider.  You  may continue all activities unless the activities cause more pain. When the pain lessens, slowly resume normal activities. Gradually increase the intensity and duration of the activities or exercise.  During periods of severe pain, bed rest may be helpful. Lie or sit in any position that is comfortable, but get out of bed and walk around at least every several hours.  If directed, put ice on the injured area. ? Put ice in a plastic bag. ? Place a towel between your skin and the bag. ? Leave the ice on for 20 minutes, 2-3 times a day. Contact a health care provider if:  Your pain is getting worse.  Your pain is not relieved with medicines.  You lose function in the area of the pain if the pain is in your arms, legs, or neck. This information is not intended to replace advice given to you by your health care provider. Make sure you discuss any questions you have with your health care provider. Document Released: 01/18/2005 Document Revised: 07/01/2015 Document Reviewed: 09/22/2012 Elsevier Interactive Patient Education  2017 [employer].

## 2016-12-15 LAB — CMP AND LIVER
ALK PHOS: 105 IU/L (ref 39–117)
ALT: 7 IU/L (ref 0–32)
AST: 11 IU/L (ref 0–40)
Albumin: 4.3 g/dL (ref 3.5–5.5)
BILIRUBIN, DIRECT: 0.13 mg/dL (ref 0.00–0.40)
BUN: 11 mg/dL (ref 6–24)
Bilirubin Total: 0.4 mg/dL (ref 0.0–1.2)
CALCIUM: 9.7 mg/dL (ref 8.7–10.2)
CO2: 25 mmol/L (ref 20–29)
CREATININE: 1.04 mg/dL — AB (ref 0.57–1.00)
Chloride: 99 mmol/L (ref 96–106)
GFR calc non Af Amer: 66 mL/min/{1.73_m2} (ref >59)
GFR, EST AFRICAN AMERICAN: 76 mL/min/{1.73_m2} (ref >59)
Glucose: 99 mg/dL (ref 65–99)
Potassium: 4.1 mmol/L (ref 3.5–5.2)
SODIUM: 139 mmol/L (ref 134–144)
TOTAL PROTEIN: 7.8 g/dL (ref 6.0–8.5)

## 2016-12-15 LAB — CBC
HEMATOCRIT: 42.5 % (ref 34.0–46.6)
HEMOGLOBIN: 14.2 g/dL (ref 11.1–15.9)
MCH: 29.8 pg (ref 26.6–33.0)
MCHC: 33.4 g/dL (ref 31.5–35.7)
MCV: 89 fL (ref 79–97)
PLATELETS: 374 10*3/uL (ref 150–379)
RBC: 4.76 x10E6/uL (ref 3.77–5.28)
RDW: 15.8 % — AB (ref 12.3–15.4)
WBC: 6.4 10*3/uL (ref 3.4–10.8)

## 2016-12-15 LAB — TSH: TSH: 1.02 u[IU]/mL (ref 0.450–4.500)

## 2016-12-27 ENCOUNTER — Telehealth: Payer: Self-pay

## 2016-12-27 NOTE — Telephone Encounter (Signed)
CMA call regarding lab results  Patient did not answer & unable to leave message  

## 2016-12-27 NOTE — Telephone Encounter (Signed)
-----   Message from Alfonse Spruce, [place] sent at 12/27/2016 12:57 PM EST ----- Thyroid function normal. When decreased it can cause fatigue. Labs that evaluated your blood cells normal. No signs of anemia present. Creatinine is midly elevated increase your water intake, continue to take medications for BP. Kidney function normal Liver function normal

## 2017-01-04 ENCOUNTER — Encounter: Payer: Self-pay | Admitting: Family Medicine

## 2017-01-04 ENCOUNTER — Ambulatory Visit: Payer: Self-pay | Attending: Family Medicine | Admitting: Family Medicine

## 2017-01-04 VITALS — BP 123/84 | HR 84 | Temp 98.6°F | Resp 18 | Ht 64.0 in | Wt 223.0 lb

## 2017-01-04 DIAGNOSIS — R05 Cough: Secondary | ICD-10-CM | POA: Insufficient documentation

## 2017-01-04 DIAGNOSIS — I10 Essential (primary) hypertension: Secondary | ICD-10-CM | POA: Insufficient documentation

## 2017-01-04 DIAGNOSIS — M79601 Pain in right arm: Secondary | ICD-10-CM | POA: Insufficient documentation

## 2017-01-04 DIAGNOSIS — J069 Acute upper respiratory infection, unspecified: Secondary | ICD-10-CM | POA: Insufficient documentation

## 2017-01-04 DIAGNOSIS — Z0000 Encounter for general adult medical examination without abnormal findings: Secondary | ICD-10-CM | POA: Insufficient documentation

## 2017-01-04 DIAGNOSIS — Z79899 Other long term (current) drug therapy: Secondary | ICD-10-CM | POA: Insufficient documentation

## 2017-01-04 MED ORDER — METHOCARBAMOL 500 MG PO TABS: 500.0000 mg | ORAL_TABLET | Freq: Three times a day (TID) | ORAL | 0 refills | Status: DC | PRN

## 2017-01-04 MED ORDER — PHENYLEPHRINE-CHLORPHEN-DM 10-2-15 MG/5ML PO LIQD: 5.0000 mL | Freq: Four times a day (QID) | ORAL | 0 refills | Status: DC | PRN

## 2017-01-04 MED ORDER — FLUTICASONE PROPIONATE 50 MCG/ACT NA SUSP: 2.0000 | Freq: Every day | NASAL | 0 refills | Status: DC

## 2017-01-04 MED ORDER — PREDNISONE 10 MG PO TABS: ORAL_TABLET | ORAL | 0 refills | Status: DC

## 2017-01-04 MED ORDER — ALBUTEROL SULFATE HFA 108 (90 BASE) MCG/ACT IN AERS: 2.0000 | INHALATION_SPRAY | Freq: Four times a day (QID) | RESPIRATORY_TRACT | 2 refills | Status: DC | PRN

## 2017-01-04 MED ORDER — NAPROXEN 500 MG PO TABS: 500.0000 mg | ORAL_TABLET | Freq: Two times a day (BID) | ORAL | 0 refills | Status: DC

## 2017-01-04 MED FILL — ?PREDNISONE 10 MG TABLET: 10 | 7 days supply | Qty: 21 | Fill #0

## 2017-01-04 MED FILL — !VENTOLIN HFA INHALER: 108 (90 BAS | 25 days supply | Qty: 18 | Fill #0

## 2017-01-04 MED FILL — NAPROXEN 500 MG TABLET: 500 | 30 days supply | Qty: 60 | Fill #0

## 2017-01-04 MED FILL — METHOCARBAMOL 500 MG TABS: 500 | 10 days supply | Qty: 30 | Fill #0

## 2017-01-04 MED FILL — HYDROCHLOROTHIAZIDE 25 MG T: 25 | 30 days supply | Qty: 30 | Fill #1

## 2017-01-04 MED FILL — FLUTICASONE PROP 50 MCG SPR: 50 | 30 days supply | Qty: 16 | Fill #0

## 2017-01-04 NOTE — Progress Notes (Signed)
Subjective:   Patient ID: Renee Collier, female    DOB: [birth year], 46 y.o.   MRN: [id number]  Chief Complaint  Patient presents with  . Annual Exam   HPI Renee Collier 46 y.o. female presents comprehensive annual physical examination.  Past medical history includes hypertension.  She reports family history of hypertension-father, cancer-mother(cervical), diabetes- none.  She reports URI symptoms.  Onset last Tuesday.  Symptoms include chills, sore throat, cough, congestion, and runny nose.  Associated symptoms include mild shortness of breath, and nighttime wheezing.  She denies any history of asthma.  She reports taking over-the-counter cold medications for symptoms with minimal relief.  She denies any recent sick contacts or fever. History of HTN.  She is not exercising and is not adherent to low salt diet. She does not check BP at home. Cardiac symptoms none. Patient denies chest pain, chest pressure/discomfort, dyspnea, lower extremity edema, near-syncope, palpitations and syncope.  Cardiovascular risk factors: hypertension and sedentary lifestyle. Use of agents associated with hypertension: none. History of target organ damage: none. Right arm pain: Onset 12 weeks ago. History of ED visit for complaint. 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 She is taking naproxen for symptoms with moderate relief symptoms.  She denies taking muscle relaxer for symptoms.  Related to injury:   No. Denies symptoms of all other pertinent systems.        Past Medical History:  Diagnosis Date  . Hypertension     Past Surgical History:  Procedure Laterality Date  . ENDOMETRIAL ABLATION    . HYSTEROSCOPY N/A 03/12/2014   Procedure: HYSTEROSCOPY WITH HYDROTHERMAL ABLATION;  Surgeon: Osborne Oman, MD;  Location: [facility];  Service: Gynecology;  Laterality: N/A;  . TUBAL LIGATION      Family History    Problem Relation Age of Onset  . Cancer Mother     Social History   Socioeconomic History  . Marital status: Single    Spouse name: Not on file  . Number of children: Not on file  . Years of education: Not on file  . Highest education level: Not on file  Social Needs  . Financial resource strain: Not on file  . Food insecurity - worry: Not on file  . Food insecurity - inability: Not on file  . Transportation needs - medical: Not on file  . Transportation needs - non-medical: Not on file  Occupational History  . Not on file  Tobacco Use  . Smoking status: Never Smoker  . Smokeless tobacco: Never Used  Substance and Sexual Activity  . Alcohol use: No  . Drug use: No  . Sexual activity: Not Currently    Birth control/protection: Surgical  Other Topics Concern  . Not on file  Social History Narrative  . Not on file    Outpatient Medications Prior to Visit  Medication Sig Dispense Refill  . hydrochlorothiazide (HYDRODIURIL) 25 MG tablet Take 1 tablet (25 mg total) by mouth daily. 90 tablet 3  . methocarbamol (ROBAXIN) 500 MG tablet Take 1 tablet (500 mg total) every 8 (eight) hours as needed by mouth for muscle spasms. 30 tablet 0  . naproxen (NAPROSYN) 500 MG tablet Take 1 tablet (500 mg total) by mouth 2 (two) times daily with a meal. X 10 days then prn pain (Patient not taking: Reported on 12/14/2016) 60 tablet 0   No facility-administered medications prior to visit.  No Known Allergies  Review of Systems  Constitutional: Positive for chills and malaise/fatigue.  HENT: Positive for congestion and sore throat.   Eyes: Negative.   Respiratory: Positive for cough.   Cardiovascular: Negative.   Gastrointestinal: Negative.   Genitourinary: Negative.   Musculoskeletal: Positive for myalgias.  Skin: Negative.   Neurological: Negative.   Psychiatric/Behavioral: Negative.       Objective:    Physical Exam  Constitutional: She is oriented to person, place, and  time. She appears well-developed and well-nourished. No distress.  HENT:  Head: Normocephalic and atraumatic.  Right Ear: External ear normal.  Left Ear: External ear normal.  Nose: Mucosal edema present.  Mouth/Throat: Uvula is midline, oropharynx is clear and moist and mucous membranes are normal. No oropharyngeal exudate or posterior oropharyngeal erythema.  Eyes: Conjunctivae and EOM are normal. Pupils are equal, round, and reactive to light.  Neck: Normal range of motion. Neck supple. No JVD present.  Cardiovascular: Normal rate, regular rhythm, normal heart sounds and intact distal pulses.  Pulmonary/Chest: Effort normal and breath sounds normal. She has no wheezes.  Abdominal: Soft. Bowel sounds are normal. There is no tenderness.  Musculoskeletal: Normal range of motion. She exhibits no edema.       Right forearm: She exhibits no swelling and no edema.  Lymphadenopathy:    She has cervical adenopathy (left sided tenderness).  Neurological: She is alert and oriented to person, place, and time. She has normal reflexes.  Skin: Skin is warm and dry.  Psychiatric: She has a normal mood and affect. She expresses no homicidal and no suicidal ideation. She expresses no suicidal plans and no homicidal plans.  Nursing note and vitals reviewed.   BP 123/84 (BP Location: Left Arm, Patient Position: Sitting, Cuff Size: Normal)   Pulse 84   Temp 98.6 F (37 C) (Oral)   Resp 18   Ht 5\' 4"  (1.626 m)   Wt 223 lb (101.2 kg)   SpO2 100%   BMI 38.28 kg/m  Wt Readings from Last 3 Encounters:  01/04/17 223 lb (101.2 kg)  12/14/16 222 lb (100.7 kg)  11/11/16 222 lb 6.4 oz (100.9 kg)    Immunization History  Administered Date(s) Administered  . Influenza,inj,Quad PF,6+ Mos 12/14/2016    Lab Results  Component Value Date   TSH 1.020 12/14/2016   Lab Results  Component Value Date   WBC 7.1 01/04/2017   HGB 14.2 01/04/2017   HCT 44.2 01/04/2017   MCV 91 01/04/2017   PLT 386 (H)  01/04/2017   Lab Results  Component Value Date   NA 141 01/04/2017   K 4.4 01/04/2017   CO2 26 01/04/2017   GLUCOSE 88 01/04/2017   BUN 10 01/04/2017   CREATININE 0.99 01/04/2017   BILITOT <0.2 01/04/2017   ALKPHOS 99 01/04/2017   AST 14 01/04/2017   ALT 9 01/04/2017   PROT 8.0 01/04/2017   ALBUMIN 4.3 01/04/2017   CALCIUM 9.8 01/04/2017   ANIONGAP 7 11/24/2015   Lab Results  Component Value Date   CHOL 159 01/04/2017   CHOL 149 11/23/2006   Lab Results  Component Value Date   HDL 55 01/04/2017   HDL 63 11/23/2006   Lab Results  Component Value Date   LDLCALC 77 01/04/2017   LDLCALC 66 11/23/2006   Lab Results  Component Value Date   TRIG 134 01/04/2017   TRIG 102 11/23/2006   Lab Results  Component Value Date   CHOLHDL 2.9 01/04/2017  CHOLHDL 2.4 Ratio 11/23/2006   Lab Results  Component Value Date   HGBA1C 5.8 (H) 01/04/2017       Assessment & Plan:   1. Annual physical exam  - CMP and Liver - Hemoglobin A1c - CBC - Lipid Panel  2. Essential hypertension BP controlled on current regimen.  3. Upper respiratory tract infection, unspecified type  - Respiratory virus panel - fluticasone (FLONASE) 50 MCG/ACT nasal spray; Place 2 sprays into both nostrils daily.  Dispense: 16 g; Refill: 0 - albuterol (PROVENTIL HFA;VENTOLIN HFA) 108 (90 Base) MCG/ACT inhaler; Inhale 2 puffs into the lungs every 6 (six) hours as needed for wheezing or shortness of breath.  Dispense: 1 Inhaler; Refill: 2 - predniSONE (DELTASONE) 10 MG tablet; DAY 1: TAKE 6 TABLETS BY MOUTH WITH MEAL; DAY 2: TAKE 5 TABLETS; DAY 3: TAKE 4 TABLETS; DAY 4: TAKE 3 TABLETS; DAY 5: TAKE 2 TABLETS; DAY 6: TAKE 1 TABLET.  Dispense: 21 tablet; Refill: 0 - Phenylephrine-Chlorphen-DM 11-02-13 MG/5ML LIQD; Take 5 mLs by mouth every 6 (six) hours as needed.  Dispense: 473 mL; Refill: 0  4. Right arm pain Encourage use of NSAIDs and muscle relaxant as needed for symptoms. Offered patient the choice  of conservative treatment and decrease heavy lifting or referral for imaging.  The patient agrees to conservative management at this time, if symptoms do not improve or worsen recommend imaging or referral. - methocarbamol (ROBAXIN) 500 MG tablet; Take 1 tablet (500 mg total) by mouth every 8 (eight) hours as needed for muscle spasms.  Dispense: 30 tablet; Refill: 0 - naproxen (NAPROSYN) 500 MG tablet; Take 1 tablet (500 mg total) by mouth 2 (two) times daily with a meal. X 10 days then prn pain  Dispense: 60 tablet; Refill: 0    Follow up: Return in about 6 months (around 07/05/2017), or if symptoms worsen or fail to improve, for HTN.   Fredia Beets, FNP

## 2017-01-04 NOTE — Progress Notes (Signed)
Patient is her for physical   Patient complains about stuffy nose sore throat chest pain wheezing since last Tuesday   Patient stated that she is taking mucunex & Nyquil for symptoms

## 2017-01-04 NOTE — Patient Instructions (Signed)
Viral Illness, Adult Viruses are tiny germs that can get into a person's body and cause illness. There are many different types of viruses, and they cause many types of illness. Viral illnesses can range from mild to severe. They can affect various parts of the body. Common illnesses that are caused by a virus include colds and the flu. Viral illnesses also include serious conditions such as HIV/AIDS (human immunodeficiency virus/acquired immunodeficiency syndrome). A few viruses have been linked to certain cancers. What are the causes? Many types of viruses can cause illness. Viruses invade cells in your body, multiply, and cause the infected cells to malfunction or die. When the cell dies, it releases more of the virus. When this happens, you develop symptoms of the illness, and the virus continues to spread to other cells. If the virus takes over the function of the cell, it can cause the cell to divide and grow out of control, as is the case when a virus causes cancer. Different viruses get into the body in different ways. You can get a virus by:  Swallowing food or water that is contaminated with the virus.  Breathing in droplets that have been coughed or sneezed into the air by an infected person.  Touching a surface that has been contaminated with the virus and then touching your eyes, nose, or mouth.  Being bitten by an insect or animal that carries the virus.  Having sexual contact with a person who is infected with the virus.  Being exposed to blood or fluids that contain the virus, either through an open cut or during a transfusion.  If a virus enters your body, your body's defense system (immune system) will try to fight the virus. You may be at higher risk for a viral illness if your immune system is weak. What are the signs or symptoms? Symptoms vary depending on the type of virus and the location of the cells that it invades. Common symptoms of the main types of viral illnesses  include: Cold and flu viruses  Fever.  Headache.  Sore throat.  Muscle aches.  Nasal congestion.  Cough. Digestive system (gastrointestinal) viruses  Fever.  Abdominal pain.  Nausea.  Diarrhea. Liver viruses (hepatitis)  Loss of appetite.  Tiredness.  Yellowing of the skin (jaundice). Brain and spinal cord viruses  Fever.  Headache.  Stiff neck.  Nausea and vomiting.  Confusion or sleepiness. Skin viruses  Warts.  Itching.  Rash. Sexually transmitted viruses  Discharge.  Swelling.  Redness.  Rash. How is this treated? Viruses can be difficult to treat because they live within cells. Antibiotic medicines do not treat viruses because these drugs do not get inside cells. Treatment for a viral illness may include:  Resting and drinking plenty of fluids.  Medicines to relieve symptoms. These can include over-the-counter medicine for pain and fever, medicines for cough or congestion, and medicines to relieve diarrhea.  Antiviral medicines. These drugs are available only for certain types of viruses. They may help reduce flu symptoms if taken early. There are also many antiviral medicines for hepatitis and HIV/AIDS.  Some viral illnesses can be prevented with vaccinations. A common example is the flu shot. Follow these instructions at home: Medicines   Take over-the-counter and prescription medicines only as told by your health care provider.  If you were prescribed an antiviral medicine, take it as told by your health care provider. Do not stop taking the medicine even if you start to feel better.  Be aware   of when antibiotics are needed and when they are not needed. Antibiotics do not treat viruses. If your health care provider thinks that you may have a bacterial infection as well as a viral infection, you may get an antibiotic. ? Do not ask for an antibiotic prescription if you have been diagnosed with a viral illness. That will not make your  illness go away faster. ? Frequently taking antibiotics when they are not needed can lead to antibiotic resistance. When this develops, the medicine no longer works against the bacteria that it normally fights. General instructions  Drink enough fluids to keep your urine clear or pale yellow.  Rest as much as possible.  Return to your normal activities as told by your health care provider. Ask your health care provider what activities are safe for you.  Keep all follow-up visits as told by your health care provider. This is important. How is this prevented? Take these actions to reduce your risk of viral infection:  Eat a healthy diet and get enough rest.  Wash your hands often with soap and water. This is especially important when you are in public places. If soap and water are not available, use hand sanitizer.  Avoid close contact with friends and family who have a viral illness.  If you travel to areas where viral gastrointestinal infection is common, avoid drinking water or eating raw food.  Keep your immunizations up to date. Get a flu shot every year as told by your health care provider.  Do not share toothbrushes, nail clippers, razors, or needles with other people.  Always practice safe sex.  Contact a health care provider if:  You have symptoms of a viral illness that do not go away.  Your symptoms come back after going away.  Your symptoms get worse. Get help right away if:  You have trouble breathing.  You have a severe headache or a stiff neck.  You have severe vomiting or abdominal pain. This information is not intended to replace advice given to you by your health care provider. Make sure you discuss any questions you have with your health care provider. Document Released: 05/30/2015 Document Revised: 07/02/2015 Document Reviewed: 05/30/2015 Elsevier Interactive Patient Education  2018 Elsevier Inc.  

## 2017-01-05 LAB — CMP AND LIVER
ALK PHOS: 99 IU/L (ref 39–117)
ALT: 9 IU/L (ref 0–32)
AST: 14 IU/L (ref 0–40)
Albumin: 4.3 g/dL (ref 3.5–5.5)
BILIRUBIN, DIRECT: 0.07 mg/dL (ref 0.00–0.40)
BUN: 10 mg/dL (ref 6–24)
Bilirubin Total: <0.2 mg/dL (ref 0.0–1.2)
CO2: 26 mmol/L (ref 20–29)
CREATININE: 0.99 mg/dL (ref 0.57–1.00)
Calcium: 9.8 mg/dL (ref 8.7–10.2)
Chloride: 100 mmol/L (ref 96–106)
GFR calc Af Amer: 80 mL/min/{1.73_m2} (ref >59)
GFR calc non Af Amer: 70 mL/min/{1.73_m2} (ref >59)
Glucose: 88 mg/dL (ref 65–99)
Potassium: 4.4 mmol/L (ref 3.5–5.2)
SODIUM: 141 mmol/L (ref 134–144)
TOTAL PROTEIN: 8 g/dL (ref 6.0–8.5)

## 2017-01-05 LAB — CBC
HEMOGLOBIN: 14.2 g/dL (ref 11.1–15.9)
Hematocrit: 44.2 % (ref 34.0–46.6)
MCH: 29.1 pg (ref 26.6–33.0)
MCHC: 32.1 g/dL (ref 31.5–35.7)
MCV: 91 fL (ref 79–97)
PLATELETS: 386 10*3/uL — AB (ref 150–379)
RBC: 4.88 x10E6/uL (ref 3.77–5.28)
RDW: 15.4 % (ref 12.3–15.4)
WBC: 7.1 10*3/uL (ref 3.4–10.8)

## 2017-01-05 LAB — LIPID PANEL
CHOLESTEROL TOTAL: 159 mg/dL (ref 100–199)
Chol/HDL Ratio: 2.9 ratio (ref 0.0–4.4)
HDL: 55 mg/dL (ref >39)
LDL CALC: 77 mg/dL (ref 0–99)
TRIGLYCERIDES: 134 mg/dL (ref 0–149)
VLDL CHOLESTEROL CAL: 27 mg/dL (ref 5–40)

## 2017-01-05 LAB — HEMOGLOBIN A1C
Est. average glucose Bld gHb Est-mCnc: 120 mg/dL
HEMOGLOBIN A1C: 5.8 % — AB (ref 4.8–5.6)

## 2017-01-06 LAB — RESPIRATORY VIRUS PANEL
Adenovirus: NEGATIVE
INFLUENZA B 1: NEGATIVE
Influenza A: NEGATIVE
Metapneumovirus: NEGATIVE
PARAINFLUENZA 1 A: NEGATIVE
PARAINFLUENZA 2 A: NEGATIVE
PARAINFLUENZA 3 A: NEGATIVE
RESPIRATORY SYNCYTIAL VIRUS A: NEGATIVE
Respiratory Syncytial Virus B: NEGATIVE
Rhinovirus: NEGATIVE

## 2017-01-21 ENCOUNTER — Ambulatory Visit: Payer: Medicaid Other | Admitting: Family Medicine

## 2017-01-26 ENCOUNTER — Telehealth (INDEPENDENT_AMBULATORY_CARE_PROVIDER_SITE_OTHER): Payer: Self-pay | Admitting: *Deleted

## 2017-01-26 NOTE — Telephone Encounter (Signed)
-----   Message from Alfonse Spruce, [place] sent at 01/14/2017 12:56 PM EST ----- Labs that evaluated your blood cells, fluid and electrolyte balance are normal. Cholesterol levels are normal. Diabetic screening indicates you are pre-diabetic. Avoid eating large amounts of starches, white bread, rice, and sugar. Increase activity. Kidney function normal Liver function normal RSV was negative for the  most common respiratory virus including influenza.

## 2017-01-26 NOTE — Telephone Encounter (Signed)
Medical Assistant left message on patient's home and cell voicemail. Voicemail states to give a call back to [place] with [facility] at [phone]. !!!Please inform patient of labs being normal. Patient is prediabetic and should avoid large amounts of carbs and increase physical activity. Patients liver and kidney function is normal and RSV was negative for any viral illnesses.!!!

## 2017-02-12 ENCOUNTER — Other Ambulatory Visit: Payer: Self-pay

## 2017-02-12 ENCOUNTER — Encounter (HOSPITAL_COMMUNITY): Payer: Self-pay | Admitting: *Deleted

## 2017-02-12 ENCOUNTER — Inpatient Hospital Stay (HOSPITAL_COMMUNITY)
Admission: AD | Admit: 2017-02-12 | Discharge: 2017-02-12 | Disposition: A | Discharge status: Home / Self Care | Payer: Medicaid Other | Source: Ambulatory Visit | Attending: Obstetrics and Gynecology | Admitting: Obstetrics and Gynecology

## 2017-02-12 DIAGNOSIS — N760 Acute vaginitis: Secondary | ICD-10-CM

## 2017-02-12 DIAGNOSIS — B9689 Other specified bacterial agents as the cause of diseases classified elsewhere: Secondary | ICD-10-CM

## 2017-02-12 DIAGNOSIS — Z3202 Encounter for pregnancy test, result negative: Secondary | ICD-10-CM | POA: Insufficient documentation

## 2017-02-12 DIAGNOSIS — R102 Pelvic and perineal pain: Secondary | ICD-10-CM

## 2017-02-12 LAB — URINALYSIS, ROUTINE W REFLEX MICROSCOPIC
Bilirubin Urine: NEGATIVE
Glucose, UA: NEGATIVE mg/dL
Hgb urine dipstick: NEGATIVE
Ketones, ur: NEGATIVE mg/dL
LEUKOCYTES UA: NEGATIVE
Nitrite: NEGATIVE
PH: 6 (ref 5.0–8.0)
Protein, ur: NEGATIVE mg/dL
SPECIFIC GRAVITY, URINE: 1.019 (ref 1.005–1.030)

## 2017-02-12 LAB — WET PREP, GENITAL
SPERM: NONE SEEN
Trich, Wet Prep: NONE SEEN
YEAST WET PREP: NONE SEEN

## 2017-02-12 LAB — POCT PREGNANCY, URINE: Preg Test, Ur: NEGATIVE

## 2017-02-12 MED ORDER — KETOROLAC TROMETHAMINE 60 MG/2ML IM SOLN
60.0000 mg | Freq: Once | INTRAMUSCULAR | Status: AC
  Administered 2017-02-12: 60 mg via INTRAMUSCULAR
  Filled 2017-02-12: qty 2

## 2017-02-12 MED ORDER — METRONIDAZOLE 500 MG PO TABS: 500.0000 mg | ORAL_TABLET | Freq: Two times a day (BID) | ORAL | 0 refills | Status: DC

## 2017-02-12 NOTE — Discharge Instructions (Signed)
Bacterial Vaginosis Bacterial vaginosis is a vaginal infection that occurs when the normal balance of bacteria in the vagina is disrupted. It results from an overgrowth of certain bacteria. This is the most common vaginal infection among women ages 7-44. Because bacterial vaginosis increases your risk for STIs (sexually transmitted infections), getting treated can help reduce your risk for chlamydia, gonorrhea, herpes, and HIV (human immunodeficiency virus). Treatment is also important for preventing complications in pregnant women, because this condition can cause an early (premature) delivery. What are the causes? This condition is caused by an increase in harmful bacteria that are normally present in small amounts in the vagina. However, the reason that the condition develops is not fully understood. What increases the risk? The following factors may make you more likely to develop this condition:  Having a new sexual partner or multiple sexual partners.  Having unprotected sex.  Douching.  Having an intrauterine device (IUD).  Smoking.  Drug and alcohol abuse.  Taking certain antibiotic medicines.  Being pregnant.  You cannot get bacterial vaginosis from toilet seats, bedding, swimming pools, or contact with objects around you. What are the signs or symptoms? Symptoms of this condition include:  Grey or white vaginal discharge. The discharge can also be watery or foamy.  A fish-like odor with discharge, especially after sexual intercourse or during menstruation.  Itching in and around the vagina.  Burning or pain with urination.  Some women with bacterial vaginosis have no signs or symptoms. How is this diagnosed? This condition is diagnosed based on:  Your medical history.  A physical exam of the vagina.  Testing a sample of vaginal fluid under a microscope to look for a large amount of bad bacteria or abnormal cells. Your health care provider may use a cotton swab  or a small wooden spatula to collect the sample.  How is this treated? This condition is treated with antibiotics. These may be given as a pill, a vaginal cream, or a medicine that is put into the vagina (suppository). If the condition comes back after treatment, a second round of antibiotics may be needed. Follow these instructions at home: Medicines  Take over-the-counter and prescription medicines only as told by your health care provider.  Take or use your antibiotic as told by your health care provider. Do not stop taking or using the antibiotic even if you start to feel better. General instructions  If you have a female sexual partner, tell her that you have a vaginal infection. She should see her health care provider and be treated if she has symptoms. If you have a female sexual partner, he does not need treatment.  During treatment: ? Avoid sexual activity until you finish treatment. ? Do not douche. ? Avoid alcohol as directed by your health care provider. ? Avoid breastfeeding as directed by your health care provider.  Drink enough water and fluids to keep your urine clear or pale yellow.  Keep the area around your vagina and rectum clean. ? Wash the area daily with warm water. ? Wipe yourself from front to back after using the toilet.  Keep all follow-up visits as told by your health care provider. This is important. How is this prevented?  Do not douche.  Wash the outside of your vagina with warm water only.  Use protection when having sex. This includes latex condoms and dental dams.  Limit how many sexual partners you have. To help prevent bacterial vaginosis, it is best to have sex with just  one partner (monogamous).  Make sure you and your sexual partner are tested for STIs.  Wear cotton or cotton-lined underwear.  Avoid wearing tight pants and pantyhose, especially during summer.  Limit the amount of alcohol that you drink.  Do not use any products that  contain nicotine or tobacco, such as cigarettes and e-cigarettes. If you need help quitting, ask your health care provider.  Do not use illegal drugs. Where to find more information:  Centers for Disease Control and Prevention: AppraiserFraud.fi  American Sexual Health Association (ASHA): www.ashastd.org  U.S. Department of Health and Financial controller, Office on Women's Health: DustingSprays.pl or SecuritiesCard.it Contact a health care provider if:  Your symptoms do not improve, even after treatment.  You have more discharge or pain when urinating.  You have a fever.  You have pain in your abdomen.  You have pain during sex.  You have vaginal bleeding between periods. Summary  Bacterial vaginosis is a vaginal infection that occurs when the normal balance of bacteria in the vagina is disrupted.  Because bacterial vaginosis increases your risk for STIs (sexually transmitted infections), getting treated can help reduce your risk for chlamydia, gonorrhea, herpes, and HIV (human immunodeficiency virus). Treatment is also important for preventing complications in pregnant women, because the condition can cause an early (premature) delivery.  This condition is treated with antibiotic medicines. These may be given as a pill, a vaginal cream, or a medicine that is put into the vagina (suppository). This information is not intended to replace advice given to you by your health care provider. Make sure you discuss any questions you have with your health care provider. Document Released: 01/18/2005 Document Revised: 05/24/2016 Document Reviewed: 10/04/2015 Elsevier Interactive Patient Education  [employer]. In late 2019, the [facility] will be moving to the [place]. At that time, the MAU (Maternity Admissions Unit), where you are being seen today, will no longer take care of non-pregnant patients. We strongly encourage you to  find a doctor's office before that time, so that you can be seen with any GYN concerns, like vaginal discharge, urinary tract infection, etc.. in a timely manner.  In order to make an office visit more convenient, the Center for [place] at [facility] will be offering evening hours with same-day appointments, walk-in appointments and scheduled appointments available during this time.  Center for [facility] @ [facility] Hours: Monday - 8am - 7:30 pm with walk-in between 4pm- 7:30 pm Tuesday - 8 am - 5 pm (starting 05/03/17 we will be open late and accepting walk-ins from 4pm - 7:30pm) Wednesday - 8 am - 5 pm (starting 08/03/17 we will be open late and accepting walk-ins from 4pm - 7:30pm) Thursday 8 am - 5 pm (starting 11/03/17 we will be open late and accepting walk-ins from 4pm - 7:30pm) Friday 8 am - 5 pm  For an appointment please call the Center for [place] @ [facility] at [phone]  For urgent needs, [facility] is also available for management of urgent GYN complaints such as vaginal discharge or urinary tract infections.  [place] for [employer] at [facility]       Phone: [phone]  Center for [employer] at [place] Phone: [place] for [employer] at [place]  Phone: [place] for [employer] at [employer]  Phone: [place] for [place] at [facility]  Phone: [phone]  [facility] [facility]       Phone: [phone]  [facility]    Phone: [phone]   [facility] [place])    Phone: [phone]  [facility]    Phone: [phone]  [facility]    Phone: [phone]  [place]    Phone: [phone]  [place] [facility]       Phone:  [phone]   [place] [facility]  Phone: [phone]  [place]    Phone: [phone]  [facility] [place]   Phone: [phone]  [facility]      Phone: [phone]  [facility]    Phone: [phone]   Pelvic Pain, Female Pelvic pain is pain in your lower abdomen, below your belly button and between your hips. The pain may start suddenly (acute), keep coming back (recurring), or last a long time (chronic). Pelvic pain that lasts longer than six months is considered chronic. Pelvic pain may affect your:  Reproductive organs.  Urinary system.  Digestive tract.  Musculoskeletal system.  There are many potential causes of pelvic pain. Sometimes, the pain can be a result of digestive or urinary conditions, strained muscles or ligaments, or even reproductive conditions. Sometimes the cause of pelvic pain is not known. Follow these instructions at home:  Take over-the-counter and prescription medicines only as told by your health care provider.  Rest as told by your health care provider.  Do not have sex it if hurts.  Keep a journal of your pelvic pain. Write down: ? When the pain started. ? Where the pain is located. ? What seems to make the pain better or worse, such as food or your menstrual cycle. ? Any symptoms you have along with the pain.  Keep all follow-up visits as told by your health care provider. This is important. Contact a health care provider if:  Medicine does not help your pain.  Your pain comes back.  You have new symptoms.  You have abnormal vaginal discharge or bleeding, including bleeding after menopause.  You have a fever or chills.  You are constipated.  You have blood in your urine or stool.  You have foul-smelling urine.  You feel weak or lightheaded. Get help right away if:  You have sudden severe pain.  Your pain gets steadily  worse.  You have severe pain along with fever, nausea, vomiting, or excessive sweating.  You lose consciousness. This information is not intended to replace advice given to you by your health care provider. Make sure you discuss any questions you have with your health care provider. Document Released: 12/16/2003 Document Revised: 02/12/2015 Document Reviewed: 11/08/2014 Elsevier Interactive Patient Education  2018 [employer].

## 2017-02-12 NOTE — MAU Provider Note (Signed)
History     CSN: [id number]  Arrival date and time: 02/12/17 1641   First Provider Initiated Contact with Patient 02/12/17 1708     Chief Complaint  Patient presents with  . Pelvic Pain   HPI Renee Collier is a 47 y.o. G3P3003 non pregnant female who presents with pelvic pain for 1 hour. She states she urinated and the pain started at 4pm. She rates the pain a 10/10 and tried 200mg  ibuprofen 15 minutes after the pain started and reports no relief. She denies any vaginal bleeding or discharge. Last intercourse 2 days ago. Denies any urinary symptoms, constipation or diarrhea.  OB History    Gravida Para Term Preterm AB Living   3 3 3     3    SAB TAB Ectopic Multiple Live Births                  Past Medical History:  Diagnosis Date  . Hypertension     Past Surgical History:  Procedure Laterality Date  . ENDOMETRIAL ABLATION    . HYSTEROSCOPY N/A 03/12/2014   Procedure: HYSTEROSCOPY WITH HYDROTHERMAL ABLATION;  Surgeon: Osborne Oman, MD;  Location: [facility];  Service: Gynecology;  Laterality: N/A;  . TUBAL LIGATION      Family History  Problem Relation Age of Onset  . Cancer Mother     Social History   Tobacco Use  . Smoking status: Never Smoker  . Smokeless tobacco: Never Used  Substance Use Topics  . Alcohol use: No  . Drug use: No    Allergies: No Known Allergies  Medications Prior to Admission  Medication Sig Dispense Refill Last Dose  . albuterol (PROVENTIL HFA;VENTOLIN HFA) 108 (90 Base) MCG/ACT inhaler Inhale 2 puffs into the lungs every 6 (six) hours as needed for wheezing or shortness of breath. 1 Inhaler 2   . fluticasone (FLONASE) 50 MCG/ACT nasal spray Place 2 sprays into both nostrils daily. 16 g 0   . hydrochlorothiazide (HYDRODIURIL) 25 MG tablet Take 1 tablet (25 mg total) by mouth daily. 90 tablet 3 Taking  . methocarbamol (ROBAXIN) 500 MG tablet Take 1 tablet (500 mg total) by mouth every 8 (eight) hours as needed for muscle spasms. 30 tablet  0   . naproxen (NAPROSYN) 500 MG tablet Take 1 tablet (500 mg total) by mouth 2 (two) times daily with a meal. X 10 days then prn pain 60 tablet 0   . Phenylephrine-Chlorphen-DM 11-02-13 MG/5ML LIQD Take 5 mLs by mouth every 6 (six) hours as needed. 473 mL 0   . predniSONE (DELTASONE) 10 MG tablet DAY 1: TAKE 6 TABLETS BY MOUTH WITH MEAL; DAY 2: TAKE 5 TABLETS; DAY 3: TAKE 4 TABLETS; DAY 4: TAKE 3 TABLETS; DAY 5: TAKE 2 TABLETS; DAY 6: TAKE 1 TABLET. 21 tablet 0     Review of Systems  Constitutional: Negative.  Negative for fatigue and fever.  HENT: Negative.   Respiratory: Negative.  Negative for shortness of breath.   Cardiovascular: Negative.  Negative for chest pain.  Gastrointestinal: Negative.  Negative for abdominal pain, constipation, diarrhea, nausea and vomiting.  Genitourinary: Positive for pelvic pain. Negative for dysuria.  Neurological: Negative.  Negative for dizziness and headaches.   Physical Exam   Blood pressure (!) 153/97, pulse 78, temperature 97.6 F (36.4 C), temperature source Oral, resp. rate 20, height 5\' 4"  (1.626 m), SpO2 98 %.  Physical Exam  Nursing note and vitals reviewed. Constitutional: She is oriented to person, place, and  time. She appears well-developed and well-nourished. No distress.  HENT:  Head: Normocephalic.  Eyes: Pupils are equal, round, and reactive to light.  Cardiovascular: Normal rate, regular rhythm and normal heart sounds.  Respiratory: Effort normal and breath sounds normal. No respiratory distress.  GI: Soft. Bowel sounds are normal. She exhibits no distension. There is no tenderness.  Neurological: She is alert and oriented to person, place, and time.  Skin: Skin is warm and dry.  Psychiatric: She has a normal mood and affect. Her behavior is normal. Judgment and thought content normal.   Pelvic exam: Cervix pink, visually closed, without lesion, scant white creamy discharge, vaginal walls and external genitalia normal Bimanual  exam: Cervix 0/long/high, firm, anterior, neg CMT, uterus nontender, nonenlarged, adnexa without tenderness, enlargement, or mass  MAU Course  Procedures Results for orders placed or performed during the hospital encounter of 02/12/17 (from the past 24 hour(s))  Urinalysis, Routine w reflex microscopic     Status: None   Collection Time: 02/12/17  5:10 PM  Result Value Ref Range   Color, Urine YELLOW YELLOW   APPearance CLEAR CLEAR   Specific Gravity, Urine 1.019 1.005 - 1.030   pH 6.0 5.0 - 8.0   Glucose, UA NEGATIVE NEGATIVE mg/dL   Hgb urine dipstick NEGATIVE NEGATIVE   Bilirubin Urine NEGATIVE NEGATIVE   Ketones, ur NEGATIVE NEGATIVE mg/dL   Protein, ur NEGATIVE NEGATIVE mg/dL   Nitrite NEGATIVE NEGATIVE   Leukocytes, UA NEGATIVE NEGATIVE  Wet prep, genital     Status: Abnormal   Collection Time: 02/12/17  5:10 PM  Result Value Ref Range   Yeast Wet Prep HPF POC NONE SEEN NONE SEEN   Trich, Wet Prep NONE SEEN NONE SEEN   Clue Cells Wet Prep HPF POC PRESENT (A) NONE SEEN   WBC, Wet Prep HPF POC FEW (A) NONE SEEN   Sperm NONE SEEN   Pregnancy, urine POC     Status: None   Collection Time: 02/12/17  5:52 PM  Result Value Ref Range   Preg Test, Ur NEGATIVE NEGATIVE     MDM UA, UPT Wet prep and gc/chlamydia Toradol IM  Assessment and Plan   1. Pelvic pain in female   2. Bacterial vaginosis    -Discharge home in stable condition -Rx for metronidazole given to patient -Patient advised to follow-up with gyn provider of choice for routine gyn care, list given -Patient may return to MAU as needed or if her condition were to change or worsen  [place] 02/12/2017, 6:01 PM

## 2017-02-12 NOTE — MAU Note (Signed)
Pt presents with c/o pelvic pain that began approximately 1 hour ago.  Took Motrin , no relief.  Denies VB.  Reports s/p BTL 20 years agoo.

## 2017-02-14 LAB — GC/CHLAMYDIA PROBE AMP (~~LOC~~) NOT AT ARMC
Chlamydia: NEGATIVE
Neisseria Gonorrhea: NEGATIVE

## 2017-02-28 ENCOUNTER — Encounter: Payer: Self-pay | Admitting: Family Medicine

## 2017-02-28 ENCOUNTER — Other Ambulatory Visit: Payer: Self-pay

## 2017-02-28 ENCOUNTER — Ambulatory Visit: Payer: Self-pay | Attending: Family Medicine | Admitting: Family Medicine

## 2017-02-28 VITALS — BP 142/95 | HR 93 | Temp 98.0°F | Ht 64.0 in | Wt 226.0 lb

## 2017-02-28 DIAGNOSIS — I10 Essential (primary) hypertension: Secondary | ICD-10-CM | POA: Insufficient documentation

## 2017-02-28 DIAGNOSIS — Z79899 Other long term (current) drug therapy: Secondary | ICD-10-CM | POA: Insufficient documentation

## 2017-02-28 DIAGNOSIS — M549 Dorsalgia, unspecified: Secondary | ICD-10-CM | POA: Insufficient documentation

## 2017-02-28 DIAGNOSIS — Z7952 Long term (current) use of systemic steroids: Secondary | ICD-10-CM | POA: Insufficient documentation

## 2017-02-28 DIAGNOSIS — G8929 Other chronic pain: Secondary | ICD-10-CM

## 2017-02-28 DIAGNOSIS — Z9889 Other specified postprocedural states: Secondary | ICD-10-CM | POA: Insufficient documentation

## 2017-02-28 DIAGNOSIS — Z791 Long term (current) use of non-steroidal anti-inflammatories (NSAID): Secondary | ICD-10-CM | POA: Insufficient documentation

## 2017-02-28 DIAGNOSIS — R102 Pelvic and perineal pain: Secondary | ICD-10-CM | POA: Insufficient documentation

## 2017-02-28 LAB — POCT URINALYSIS DIPSTICK
Bilirubin, UA: NEGATIVE
Glucose, UA: NEGATIVE
Ketones, UA: NEGATIVE
Leukocytes, UA: NEGATIVE
NITRITE UA: NEGATIVE
PH UA: 5.5 (ref 5.0–8.0)
PROTEIN UA: NEGATIVE
Spec Grav, UA: >=1.03 — AB (ref 1.010–1.025)
UROBILINOGEN UA: 0.2 U/dL

## 2017-02-28 LAB — POCT URINE PREGNANCY: Preg Test, Ur: NEGATIVE

## 2017-02-28 MED ORDER — IBUPROFEN 600 MG PO TABS: 600.0000 mg | ORAL_TABLET | Freq: Three times a day (TID) | ORAL | 1 refills | Status: DC | PRN

## 2017-02-28 NOTE — Progress Notes (Signed)
Went to [facility] for pelvic pain dx'd with BV Completed ATB Still has pain in groin area and mid- lower back

## 2017-02-28 NOTE — Progress Notes (Signed)
Subjective:  Patient ID: Renee Collier, female    DOB: 21-Apr-1970  Age: 47 y.o. MRN: [id number]  CC: Groin Pain and Back Pain   HPI Renee Collier presents for complaint of left sided pelvic pain. Symptoms include radiation down LLE and fatigue.  Onset a few month ago. She reports at initial onset episodes occurred 2 to 3 times per month but have gradually progressed to everyday. She reports similar symptoms prior to uterine ablation in the past. She denies any AUB, she denies any bowel or bladder changes.   Hypertension  Disease Monitoring  Blood pressure range: She does not regularly  check BP at home. She has not taken BP medication prior to office visit.  Chest pain: no   Dyspnea: no   Claudication: no   Medication compliance: yes  Medication Side Effects  Lightheadedness: no   Urinary frequency: no   Edema: no   Preventitive Healthcare:  Exercise: no   Diet Pattern: low sodium  Salt Restriction: no    Outpatient Medications Prior to Visit  Medication Sig Dispense Refill  . albuterol (PROVENTIL HFA;VENTOLIN HFA) 108 (90 Base) MCG/ACT inhaler Inhale 2 puffs into the lungs every 6 (six) hours as needed for wheezing or shortness of breath. 1 Inhaler 2  . fluticasone (FLONASE) 50 MCG/ACT nasal spray Place 2 sprays into both nostrils daily. 16 g 0  . hydrochlorothiazide (HYDRODIURIL) 25 MG tablet Take 1 tablet (25 mg total) by mouth daily. 90 tablet 3  . naproxen (NAPROSYN) 500 MG tablet Take 1 tablet (500 mg total) by mouth 2 (two) times daily with a meal. X 10 days then prn pain 60 tablet 0  . metroNIDAZOLE (FLAGYL) 500 MG tablet Take 1 tablet (500 mg total) by mouth 2 (two) times daily. (Patient not taking: Reported on 02/28/2017) 14 tablet 0  . methocarbamol (ROBAXIN) 500 MG tablet Take 1 tablet (500 mg total) by mouth every 8 (eight) hours as needed for muscle spasms. (Patient not taking: Reported on 02/28/2017) 30 tablet 0  . Phenylephrine-Chlorphen-DM 11-02-13 MG/5ML LIQD  Take 5 mLs by mouth every 6 (six) hours as needed. (Patient not taking: Reported on 02/28/2017) 473 mL 0  . predniSONE (DELTASONE) 10 MG tablet DAY 1: TAKE 6 TABLETS BY MOUTH WITH MEAL; DAY 2: TAKE 5 TABLETS; DAY 3: TAKE 4 TABLETS; DAY 4: TAKE 3 TABLETS; DAY 5: TAKE 2 TABLETS; DAY 6: TAKE 1 TABLET. (Patient not taking: Reported on 02/28/2017) 21 tablet 0   No facility-administered medications prior to visit.     ROS Review of Systems  Constitutional: Positive for fatigue.  Respiratory: Negative.   Cardiovascular: Negative.   Genitourinary: Positive for pelvic pain. Negative for vaginal bleeding.  Musculoskeletal: Positive for myalgias.  Psychiatric/Behavioral: Negative.     Objective:  BP (!) 142/95 (BP Location: Right Arm, Patient Position: Sitting, Cuff Size: Large)   Pulse 93   Temp 98 F (36.7 C) (Oral)   Ht 5\' 4"  (1.626 m)   Wt 226 lb (102.5 kg)   BMI 38.79 kg/m   BP/Weight 02/28/2017 02/12/2017 36/05/6801  Systolic BP 212 248 250  Diastolic BP 95 037 84  Wt. (Lbs) 226 - 223  BMI 38.79 38.28 38.28    Physical Exam  Constitutional: She appears well-developed and well-nourished.  Cardiovascular: Normal rate, regular rhythm, normal heart sounds and intact distal pulses.  Pulmonary/Chest: Effort normal and breath sounds normal.  Abdominal: Soft. Bowel sounds are normal. There is tenderness (left pelvic).  Skin: Skin is warm and  dry.  Psychiatric: She has a normal mood and affect.  Nursing note and vitals reviewed.   Assessment & Plan:   1. Chronic pelvic pain in female  - Urinalysis Dipstick - POCT urine pregnancy - US Pelvis Complete; Future - US Transvaginal Non-OB; Future - ibuprofen (ADVIL,MOTRIN) 600 MG tablet; Take 1 tablet (600 mg total) by mouth every 8 (eight) hours as needed for moderate pain or cramping. Take with food.  Dispense: 30 tablet; Refill: 1  2. Status post HTA endometrial ablation  - US Pelvis Complete; Future - US Transvaginal Non-OB;  Future  3. Essential hypertension      Follow-up: Return if symptoms worsen or fail to improve.   Alfonse Spruce FNP

## 2017-02-28 NOTE — Patient Instructions (Signed)
Pelvic Pain, Female °Pelvic pain is pain in your lower belly (abdomen), below your belly button and between your hips. The pain may start suddenly (acute), keep coming back (recurring), or last a long time (chronic). Pelvic pain that lasts longer than six months is considered chronic. There are many causes of pelvic pain. Sometimes the cause of your pelvic pain is not known. °Follow these instructions at home: °· Take over-the-counter and prescription medicines only as told by your doctor. °· Rest as told by your doctor. °· Do not have sex it if hurts. °· Keep a journal of your pelvic pain. Write down: °? When the pain started. °? Where the pain is located. °? What seems to make the pain better or worse, such as food or your menstrual cycle. °? Any symptoms you have along with the pain. °· Keep all follow-up visits as told by your doctor. This is important. °Contact a doctor if: °· Medicine does not help your pain. °· Your pain comes back. °· You have new symptoms. °· You have unusual vaginal discharge or bleeding. °· You have a fever or chills. °· You are having a hard time pooping (constipation). °· You have blood in your pee (urine) or poop (stool). °· Your pee smells bad. °· You feel weak or lightheaded. °Get help right away if: °· You have sudden pain that is very bad. °· Your pain continues to get worse. °· You have very bad pain and also have any of the following symptoms: °? A fever. °? Feeling stick to your stomach (nausea). °? Throwing up (vomiting). °? Being very sweaty. °· You pass out (lose consciousness). °This information is not intended to replace advice given to you by your health care provider. Make sure you discuss any questions you have with your health care provider. °Document Released: 07/07/2007 Document Revised: 02/12/2015 Document Reviewed: 11/08/2014 °Elsevier Interactive Patient Education © 2018 [employer] ° °

## 2017-03-03 ENCOUNTER — Other Ambulatory Visit: Payer: Self-pay | Admitting: Family Medicine

## 2017-03-03 ENCOUNTER — Ambulatory Visit (HOSPITAL_COMMUNITY)
Admission: RE | Admit: 2017-03-03 | Discharge: 2017-03-03 | Disposition: A | Discharge status: Home / Self Care | Payer: Self-pay | Source: Ambulatory Visit | Attending: Family Medicine | Admitting: Family Medicine

## 2017-03-03 DIAGNOSIS — R102 Pelvic and perineal pain: Secondary | ICD-10-CM | POA: Insufficient documentation

## 2017-03-03 DIAGNOSIS — Z9889 Other specified postprocedural states: Secondary | ICD-10-CM | POA: Insufficient documentation

## 2017-03-03 DIAGNOSIS — G8929 Other chronic pain: Secondary | ICD-10-CM | POA: Insufficient documentation

## 2017-03-03 DIAGNOSIS — R9389 Abnormal findings on diagnostic imaging of other specified body structures: Secondary | ICD-10-CM

## 2017-03-03 DIAGNOSIS — N858 Other specified noninflammatory disorders of uterus: Secondary | ICD-10-CM | POA: Insufficient documentation

## 2017-03-04 ENCOUNTER — Telehealth: Payer: Self-pay

## 2017-03-04 NOTE — Telephone Encounter (Signed)
Contacted pt to go over [place] results pt didn't answer and was unable to lvm  If pt calls back please give results: Abnormal uterine lining, multiple non-specific endometrial masses. Futher workup recommended. You will be referred to gynecology. Please follow up.

## 2017-03-07 ENCOUNTER — Telehealth: Payer: Self-pay | Admitting: Family Medicine

## 2017-03-07 NOTE — Telephone Encounter (Signed)
Attempted to call patient but no answer. If she is still experiencing pain can either increase ibuprofen to 800 mg or provide with tramadol or tylenol #3.

## 2017-03-07 NOTE — Telephone Encounter (Signed)
Called patient to follow up.  No answer.

## 2017-03-07 NOTE — Telephone Encounter (Signed)
Pt called back to receive her results, she was given what was outlined but had some questions. Please follow up to the updated phone number

## 2017-03-07 NOTE — Telephone Encounter (Signed)
Pt called again and call was transfer pt just had a couple questions.. Pt wanted to know who would she be following up with I informed pt that she will be following up with GYN and they contact her to schedule an appointment. Pt also wanted to know the time frame for referrals. I informed pt that our referral coordinator is doing her best to get everyone referral done in a timely manner but it will take time. Pt states she understands   Pt wants to know what she should about pain. Please f/u

## 2017-03-09 ENCOUNTER — Other Ambulatory Visit: Payer: Self-pay | Admitting: Internal Medicine

## 2017-03-09 MED ORDER — ACETAMINOPHEN-CODEINE #3 300-30 MG PO TABS: 1.0000 | ORAL_TABLET | ORAL | 0 refills | Status: DC | PRN

## 2017-03-09 MED FILL — ACETAMINOPHEN/COD #3 TABLET: 300-30 | 10 days supply | Qty: 60 | Fill #0

## 2017-03-09 NOTE — Telephone Encounter (Signed)
Tylenol #3 sent to pharmacy. Informed that medication may cause drowsiness. Not to take while working or before driving. Instructed to take Ibuprofen during the day. Pt verbalized understanding.

## 2017-03-09 NOTE — Telephone Encounter (Signed)
Pt aware of number to call for appointment: [phone]. Would like to try Tylenol #3.

## 2017-04-01 ENCOUNTER — Ambulatory Visit (INDEPENDENT_AMBULATORY_CARE_PROVIDER_SITE_OTHER): Payer: Self-pay | Admitting: Obstetrics & Gynecology

## 2017-04-01 ENCOUNTER — Encounter: Payer: Self-pay | Admitting: Obstetrics & Gynecology

## 2017-04-01 VITALS — BP 137/106 | HR 112 | Wt 262.4 lb

## 2017-04-01 DIAGNOSIS — R9389 Abnormal findings on diagnostic imaging of other specified body structures: Secondary | ICD-10-CM

## 2017-04-01 DIAGNOSIS — R102 Pelvic and perineal pain: Secondary | ICD-10-CM

## 2017-04-01 MED ORDER — OXYCODONE-ACETAMINOPHEN 5-325 MG PO TABS: 1.0000 | ORAL_TABLET | Freq: Four times a day (QID) | ORAL | 0 refills | Status: DC | PRN

## 2017-04-01 MED ORDER — IBUPROFEN 600 MG PO TABS: 600.0000 mg | ORAL_TABLET | Freq: Four times a day (QID) | ORAL | 1 refills | Status: DC | PRN

## 2017-04-01 MED FILL — HYDROCHLOROTHIAZIDE 25 MG T: 25 | 30 days supply | Qty: 30 | Fill #2

## 2017-04-01 NOTE — Progress Notes (Signed)
Patient ID: Renee Collier, female   DOB: [birth year], 47 y.o.   MRN: [id number]  Chief Complaint  Patient presents with  . Pelvic Pain    HPI Renee Collier is a 47 y.o. female. Single P3 (24, 40, and 43 yo kids, 2 grandsons) here today as a referral after an u/s was done for pelvic pain. The u/s showed cervical stenosis and a collection of fluid and polypoid tissue. She had a partial ablation (HTA) about 3 years ago. She has no bleeding since procedure. HPI  She has not been to work at [employer] for a week due to pain. She attributes her higher than usual BP to her constant pain. She was prescribed Tylenol #3 but this is not helping.  Past Medical History:  Diagnosis Date  . Hypertension     Past Surgical History:  Procedure Laterality Date  . ENDOMETRIAL ABLATION    . HYSTEROSCOPY N/A 03/12/2014   Procedure: HYSTEROSCOPY WITH HYDROTHERMAL ABLATION;  Surgeon: Osborne Oman, MD;  Location: [facility];  Service: Gynecology;  Laterality: N/A;  . TUBAL LIGATION      Family History  Problem Relation Age of Onset  . Cancer Mother     Social History Social History   Tobacco Use  . Smoking status: Never Smoker  . Smokeless tobacco: Never Used  Substance Use Topics  . Alcohol use: No  . Drug use: No    No Known Allergies  Current Outpatient Medications  Medication Sig Dispense Refill  . acetaminophen-codeine (TYLENOL #3) 300-30 MG tablet Take 1 tablet by mouth every 4 (four) hours as needed. 60 tablet 0  . hydrochlorothiazide (HYDRODIURIL) 25 MG tablet Take 1 tablet (25 mg total) by mouth daily. 90 tablet 3  . albuterol (PROVENTIL HFA;VENTOLIN HFA) 108 (90 Base) MCG/ACT inhaler Inhale 2 puffs into the lungs every 6 (six) hours as needed for wheezing or shortness of breath. (Patient not taking: Reported on 04/01/2017) 1 Inhaler 2  . fluticasone (FLONASE) 50 MCG/ACT nasal spray Place 2 sprays into both nostrils daily. (Patient not taking: Reported on 04/01/2017) 16 g 0  . ibuprofen  (ADVIL,MOTRIN) 600 MG tablet Take 1 tablet (600 mg total) by mouth every 8 (eight) hours as needed for moderate pain or cramping. Take with food. (Patient not taking: Reported on 04/01/2017) 30 tablet 1  . metroNIDAZOLE (FLAGYL) 500 MG tablet Take 1 tablet (500 mg total) by mouth 2 (two) times daily. (Patient not taking: Reported on 02/28/2017) 14 tablet 0   No current facility-administered medications for this visit.     Review of Systems Review of Systems [facility] and it was normal.  Blood pressure (!) 137/106, pulse (!) 112, weight 262 lb 6.4 oz (119 kg).  Physical Exam Physical Exam  Breathing, conversing, and ambulating normally Well nourished, well hydrated Black female, no apparent distress Abd- benign   Data Reviewed IMPRESSION: 1. The endometrial cavity is distended with complex fluid. There are multiple polypoid appearing masses within the endometrial canal. Overall findings are nonspecific in etiology, particularly given history of endometrial ablation. While the findings may be post ablative in etiology, endometrial carcinoma cannot be excluded. Recommend further evaluation of the endometrial cavity with saline infused sonohysterography and/or endometrial biopsy. Additionally, cervical stenosis as a cause of fluid within the endometrial canal is not excluded. 2. These results will be called to the ordering clinician or representative by the Radiologist Assistant, and communication documented in the PACS or zVision  Dashboard.  Assessment    Pelvic pain, cervical stenosis and collection of intrauterine material after a HTA    Plan    Plan for ultrasound guided d&c If pain no better after that, then rec hysterectomy.  percocets #30 no refills IBU 800mg       Muriah Harsha C Zethan Alfieri 04/01/2017, 9:57 AM

## 2017-04-04 ENCOUNTER — Encounter (HOSPITAL_COMMUNITY): Payer: Self-pay

## 2017-04-05 ENCOUNTER — Other Ambulatory Visit (HOSPITAL_COMMUNITY): Payer: Self-pay | Admitting: Obstetrics & Gynecology

## 2017-04-05 DIAGNOSIS — N882 Stricture and stenosis of cervix uteri: Secondary | ICD-10-CM

## 2017-04-05 DIAGNOSIS — R102 Pelvic and perineal pain: Secondary | ICD-10-CM

## 2017-04-21 ENCOUNTER — Encounter (HOSPITAL_BASED_OUTPATIENT_CLINIC_OR_DEPARTMENT_OTHER): Payer: Self-pay | Admitting: *Deleted

## 2017-04-26 ENCOUNTER — Encounter (HOSPITAL_BASED_OUTPATIENT_CLINIC_OR_DEPARTMENT_OTHER)
Admission: RE | Admit: 2017-04-26 | Discharge: 2017-04-26 | Disposition: A | Discharge status: Home / Self Care | Payer: Self-pay | Source: Ambulatory Visit | Attending: Obstetrics & Gynecology | Admitting: Obstetrics & Gynecology

## 2017-04-26 DIAGNOSIS — Z01812 Encounter for preprocedural laboratory examination: Secondary | ICD-10-CM | POA: Insufficient documentation

## 2017-04-26 DIAGNOSIS — Z01810 Encounter for preprocedural cardiovascular examination: Secondary | ICD-10-CM | POA: Insufficient documentation

## 2017-04-26 LAB — BASIC METABOLIC PANEL
Anion gap: 10 (ref 5–15)
BUN: 9 mg/dL (ref 6–20)
CO2: 28 mmol/L (ref 22–32)
CREATININE: 0.94 mg/dL (ref 0.44–1.00)
Calcium: 9.2 mg/dL (ref 8.9–10.3)
Chloride: 99 mmol/L — ABNORMAL LOW (ref 101–111)
GFR calc Af Amer: >60 mL/min (ref >60)
GFR calc non Af Amer: >60 mL/min (ref >60)
GLUCOSE: 98 mg/dL (ref 65–99)
Potassium: 3.7 mmol/L (ref 3.5–5.1)
Sodium: 137 mmol/L (ref 135–145)

## 2017-04-26 LAB — CBC
HEMATOCRIT: 42.8 % (ref 36.0–46.0)
Hemoglobin: 13.9 g/dL (ref 12.0–15.0)
MCH: 29.8 pg (ref 26.0–34.0)
MCHC: 32.5 g/dL (ref 30.0–36.0)
MCV: 91.6 fL (ref 78.0–100.0)
Platelets: 429 10*3/uL — ABNORMAL HIGH (ref 150–400)
RBC: 4.67 MIL/uL (ref 3.87–5.11)
RDW: 14.1 % (ref 11.5–15.5)
WBC: 5.8 10*3/uL (ref 4.0–10.5)

## 2017-04-26 LAB — POCT PREGNANCY, URINE: Preg Test, Ur: NEGATIVE

## 2017-04-26 NOTE — Progress Notes (Signed)
EKG reviewed by Dr. Turk, will proceed with surgery as scheduled. 

## 2017-04-27 ENCOUNTER — Ambulatory Visit (HOSPITAL_BASED_OUTPATIENT_CLINIC_OR_DEPARTMENT_OTHER): Payer: Self-pay | Admitting: Certified Registered"

## 2017-04-27 ENCOUNTER — Encounter (HOSPITAL_BASED_OUTPATIENT_CLINIC_OR_DEPARTMENT_OTHER)
Admission: RE | Disposition: A | Discharge status: Home / Self Care | Payer: Self-pay | Source: Ambulatory Visit | Attending: Obstetrics & Gynecology

## 2017-04-27 ENCOUNTER — Other Ambulatory Visit: Payer: Self-pay

## 2017-04-27 ENCOUNTER — Ambulatory Visit (HOSPITAL_COMMUNITY)
Admission: RE | Admit: 2017-04-27 | Discharge: 2017-04-27 | Disposition: A | Discharge status: Home / Self Care | Payer: Self-pay | Source: Ambulatory Visit | Attending: Obstetrics & Gynecology | Admitting: Obstetrics & Gynecology

## 2017-04-27 ENCOUNTER — Encounter (HOSPITAL_BASED_OUTPATIENT_CLINIC_OR_DEPARTMENT_OTHER): Payer: Self-pay | Admitting: *Deleted

## 2017-04-27 ENCOUNTER — Ambulatory Visit (HOSPITAL_BASED_OUTPATIENT_CLINIC_OR_DEPARTMENT_OTHER)
Admission: RE | Admit: 2017-04-27 | Discharge: 2017-04-27 | Disposition: A | Discharge status: Home / Self Care | Payer: Self-pay | Source: Ambulatory Visit | Attending: Obstetrics & Gynecology | Admitting: Obstetrics & Gynecology

## 2017-04-27 DIAGNOSIS — R102 Pelvic and perineal pain: Secondary | ICD-10-CM

## 2017-04-27 DIAGNOSIS — Z6837 Body mass index (BMI) 37.0-37.9, adult: Secondary | ICD-10-CM | POA: Insufficient documentation

## 2017-04-27 DIAGNOSIS — N840 Polyp of corpus uteri: Secondary | ICD-10-CM | POA: Insufficient documentation

## 2017-04-27 DIAGNOSIS — E669 Obesity, unspecified: Secondary | ICD-10-CM | POA: Insufficient documentation

## 2017-04-27 DIAGNOSIS — I10 Essential (primary) hypertension: Secondary | ICD-10-CM | POA: Insufficient documentation

## 2017-04-27 DIAGNOSIS — N882 Stricture and stenosis of cervix uteri: Secondary | ICD-10-CM | POA: Insufficient documentation

## 2017-04-27 DIAGNOSIS — N814 Uterovaginal prolapse, unspecified: Secondary | ICD-10-CM | POA: Insufficient documentation

## 2017-04-27 HISTORY — PX: DILATION AND CURETTAGE OF UTERUS: SHX78

## 2017-04-27 SURGERY — DILATION AND CURETTAGE
Anesthesia: General | Site: Vagina

## 2017-04-27 MED ORDER — LIDOCAINE HCL (CARDIAC) 20 MG/ML IV SOLN
INTRAVENOUS | Status: DC | PRN
  Administered 2017-04-27: 100 mg via INTRAVENOUS

## 2017-04-27 MED ORDER — PROPOFOL 10 MG/ML IV BOLUS
INTRAVENOUS | Status: DC | PRN
  Administered 2017-04-27: 150 mg via INTRAVENOUS

## 2017-04-27 MED ORDER — LACTATED RINGERS IV SOLN
INTRAVENOUS | Status: DC
  Administered 2017-04-27: 10:00:00 via INTRAVENOUS

## 2017-04-27 MED ORDER — KETOROLAC TROMETHAMINE 30 MG/ML IJ SOLN
INTRAMUSCULAR | Status: AC
  Filled 2017-04-27: qty 1

## 2017-04-27 MED ORDER — FENTANYL CITRATE (PF) 100 MCG/2ML IJ SOLN
INTRAMUSCULAR | Status: AC
  Filled 2017-04-27: qty 2

## 2017-04-27 MED ORDER — OXYCODONE HCL 5 MG/5ML PO SOLN: 5.0000 mg | Freq: Once | ORAL | Status: DC | PRN

## 2017-04-27 MED ORDER — BUPIVACAINE HCL 0.5 % IJ SOLN
INTRAMUSCULAR | Status: DC | PRN
  Administered 2017-04-27: 30 mL

## 2017-04-27 MED ORDER — MIDAZOLAM HCL 2 MG/2ML IJ SOLN
1.0000 mg | INTRAMUSCULAR | Status: DC | PRN
  Administered 2017-04-27: 2 mg via INTRAVENOUS

## 2017-04-27 MED ORDER — ONDANSETRON HCL 4 MG/2ML IJ SOLN
INTRAMUSCULAR | Status: AC
  Filled 2017-04-27: qty 2

## 2017-04-27 MED ORDER — FENTANYL CITRATE (PF) 100 MCG/2ML IJ SOLN
50.0000 ug | INTRAMUSCULAR | Status: DC | PRN
  Administered 2017-04-27 (×2): 50 ug via INTRAVENOUS

## 2017-04-27 MED ORDER — IBUPROFEN 600 MG PO TABS: 600.0000 mg | ORAL_TABLET | Freq: Four times a day (QID) | ORAL | 1 refills | Status: DC | PRN

## 2017-04-27 MED ORDER — KETOROLAC TROMETHAMINE 30 MG/ML IJ SOLN
INTRAMUSCULAR | Status: DC | PRN
  Administered 2017-04-27: 30 mg via INTRAVENOUS

## 2017-04-27 MED ORDER — ONDANSETRON HCL 4 MG/2ML IJ SOLN
INTRAMUSCULAR | Status: DC | PRN
  Administered 2017-04-27: 4 mg via INTRAVENOUS

## 2017-04-27 MED ORDER — MEPERIDINE HCL 25 MG/ML IJ SOLN: 6.2500 mg | INTRAMUSCULAR | Status: DC | PRN

## 2017-04-27 MED ORDER — PROPOFOL 500 MG/50ML IV EMUL
INTRAVENOUS | Status: AC
  Filled 2017-04-27: qty 50

## 2017-04-27 MED ORDER — DEXAMETHASONE SODIUM PHOSPHATE 10 MG/ML IJ SOLN
INTRAMUSCULAR | Status: DC | PRN
  Administered 2017-04-27: 10 mg via INTRAVENOUS

## 2017-04-27 MED ORDER — PROMETHAZINE HCL 25 MG/ML IJ SOLN: 6.2500 mg | INTRAMUSCULAR | Status: DC | PRN

## 2017-04-27 MED ORDER — MIDAZOLAM HCL 2 MG/2ML IJ SOLN
INTRAMUSCULAR | Status: AC
  Filled 2017-04-27: qty 2

## 2017-04-27 MED ORDER — SCOPOLAMINE 1 MG/3DAYS TD PT72: 1.0000 | MEDICATED_PATCH | Freq: Once | TRANSDERMAL | Status: DC | PRN

## 2017-04-27 MED ORDER — OXYCODONE HCL 5 MG PO TABS: 5.0000 mg | ORAL_TABLET | Freq: Once | ORAL | Status: DC | PRN

## 2017-04-27 MED ORDER — HYDROMORPHONE HCL 1 MG/ML IJ SOLN: 0.2500 mg | INTRAMUSCULAR | Status: DC | PRN

## 2017-04-27 MED FILL — IBUPROFEN 600 MG TABLET: 600 | 7 days supply | Qty: 30 | Fill #0

## 2017-04-27 SURGICAL SUPPLY — 24 items
BRIEF STRETCH FOR OB PAD XXL (UNDERPADS AND DIAPERS) ×3 IMPLANT
CLOTH BEACON ORANGE TIMEOUT ST (SAFETY) ×1 IMPLANT
CONTAINER PREFILL 10% NBF 60ML (FORM) ×2 IMPLANT
DILATOR CANAL MILEX (MISCELLANEOUS) IMPLANT
GLOVE BIO SURGEON STRL SZ 6.5 (GLOVE) ×2 IMPLANT
GLOVE BIO SURGEONS STRL SZ 6.5 (GLOVE) ×1
GLOVE BIOGEL PI IND STRL 7.0 (GLOVE) ×1 IMPLANT
GLOVE BIOGEL PI INDICATOR 7.0 (GLOVE) ×2
GLOVE NEODERM STRL 7.5 LF PF (GLOVE) IMPLANT
GLOVE SURG NEODERM 7.5  LF PF (GLOVE) ×2
GOWN STRL REUS W/ TWL XL LVL3 (GOWN DISPOSABLE) ×1 IMPLANT
GOWN STRL REUS W/TWL LRG LVL3 (GOWN DISPOSABLE) ×3 IMPLANT
GOWN STRL REUS W/TWL XL LVL3 (GOWN DISPOSABLE) ×3
NDL SPNL 18GX3.5 QUINCKE PK (NEEDLE) ×1 IMPLANT
NEEDLE SPNL 18GX3.5 QUINCKE PK (NEEDLE) ×3 IMPLANT
PACK VAGINAL MINOR WOMEN LF (CUSTOM PROCEDURE TRAY) ×3 IMPLANT
PAD OB MATERNITY 4.3X12.25 (PERSONAL CARE ITEMS) ×3 IMPLANT
PAD PREP 24X48 CUFFED NSTRL (MISCELLANEOUS) ×3 IMPLANT
SLEEVE SCD COMPRESS KNEE MED (MISCELLANEOUS) ×3 IMPLANT
TOWEL OR 17X24 6PK STRL BLUE (TOWEL DISPOSABLE) ×6 IMPLANT
TUBE CONNECTING 20'X1/4 (TUBING) ×1
TUBE CONNECTING 20X1/4 (TUBING) ×1 IMPLANT
UNDERPAD 30X30 (UNDERPADS AND DIAPERS) ×3 IMPLANT
YANKAUER SUCT BULB TIP NO VENT (SUCTIONS) ×2 IMPLANT

## 2017-04-27 NOTE — Anesthesia Postprocedure Evaluation (Signed)
Anesthesia Post Note  Patient: Renee Collier  Procedure(s) Performed: Ultrasound Guided Dilatation and Curettage (N/A Vagina )     Patient location during evaluation: PACU Anesthesia Type: General Level of consciousness: awake and alert Pain management: pain level controlled Vital Signs Assessment: post-procedure vital signs reviewed and stable Respiratory status: spontaneous breathing, nonlabored ventilation and respiratory function stable Cardiovascular status: blood pressure returned to baseline and stable Postop Assessment: no apparent nausea or vomiting Anesthetic complications: no    Last Vitals:  Vitals:   04/27/17 1130 04/27/17 1150  BP: (!) 136/92 (!) 150/94  Pulse: 80 94  Resp: 14 20  Temp:  36.6 C  SpO2: 99% 100%    Last Pain:  Vitals:   04/27/17 1150  TempSrc:   PainSc: 2                  Lynda Rainwater

## 2017-04-27 NOTE — Anesthesia Procedure Notes (Signed)
Procedure Name: LMA Insertion Performed by: Jeyli Zwicker M, CRNA Pre-anesthesia Checklist: Patient identified, Emergency Drugs available, Suction available, Patient being monitored and Timeout performed Patient Re-evaluated:Patient Re-evaluated prior to induction Oxygen Delivery Method: Circle system utilized Preoxygenation: Pre-oxygenation with 100% oxygen Induction Type: IV induction Ventilation: Mask ventilation without difficulty LMA: LMA inserted LMA Size: 4.0 Tube type: Oral Number of attempts: 1 Placement Confirmation: positive ETCO2,  CO2 detector and breath sounds checked- equal and bilateral Tube secured with: Tape Dental Injury: Teeth and Oropharynx as per pre-operative assessment        

## 2017-04-27 NOTE — Discharge Instructions (Signed)
Dilation and Curettage or Vacuum Curettage, Care After °This sheet gives you information about how to care for yourself after your procedure. Your health care provider may also give you more specific instructions. If you have problems or questions, contact your health care provider. °What can I expect after the procedure? °After your procedure, it is common to have: °· Mild pain or cramping. °· Some vaginal bleeding or spotting. ° °These may last for up to 2 weeks after your procedure. °Follow these instructions at home: °Activity ° °· Do not drive or use heavy machinery while taking prescription pain medicine. °· Avoid driving for the first 24 hours after your procedure. °· Take frequent, short walks, followed by rest periods, throughout the day. Ask your health care provider what activities are safe for you. After 1-2 days, you may be able to return to your normal activities. °· Do not lift anything heavier than 10 lb (4.5 kg) until your health care provider approves. °· For at least 2 weeks, or as long as told by your health care provider, do not: °? Douche. °? Use tampons. °? Have sexual intercourse. °General instructions ° °· Take over-the-counter and prescription medicines only as told by your health care provider. This is especially important if you take blood thinning medicine. °· Do not take baths, swim, or use a hot tub until your health care provider approves. Take showers instead of baths. °· Wear compression stockings as told by your health care provider. These stockings help to prevent blood clots and reduce swelling in your legs. °· It is your responsibility to get the results of your procedure. Ask your health care provider, or the department performing the procedure, when your results will be ready. °· Keep all follow-up visits as told by your health care provider. This is important. °Contact a health care provider if: °· You have severe cramps that get worse or that do not get better with  medicine. °· You have severe abdominal pain. °· You cannot drink fluids without vomiting. °· You develop pain in a different area of your pelvis. °· You have bad-smelling vaginal discharge. °· You have a rash. °Get help right away if: °· You have vaginal bleeding that soaks more than one sanitary pad in 1 hour, for 2 hours in a row. °· You pass large blood clots from your vagina. °· You have a fever that is above 100.4°F (38.0°C). °· Your abdomen feels very tender or hard. °· You have chest pain. °· You have shortness of breath. °· You cough up blood. °· You feel dizzy or light-headed. °· You faint. °· You have pain in your neck or shoulder area. °This information is not intended to replace advice given to you by your health care provider. Make sure you discuss any questions you have with your health care provider. ° ° °Post Anesthesia Home Care Instructions ° °Activity: °Get plenty of rest for the remainder of the day. A responsible individual must stay with you for 24 hours following the procedure.  °For the next 24 hours, DO NOT: °-Drive a car °-Operate machinery °-Drink alcoholic beverages °-Take any medication unless instructed by your physician °-Make any legal decisions or sign important papers. ° °Meals: °Start with liquid foods such as gelatin or soup. Progress to regular foods as tolerated. Avoid greasy, spicy, heavy foods. If nausea and/or vomiting occur, drink only clear liquids until the nausea and/or vomiting subsides. Call your physician if vomiting continues. ° °Special Instructions/Symptoms: °Your throat may feel dry   or sore from the anesthesia or the breathing tube placed in your throat during surgery. If this causes discomfort, gargle with warm salt water. The discomfort should disappear within 24 hours. ° °If you had a scopolamine patch placed behind your ear for the management of post- operative nausea and/or vomiting: ° °1. The medication in the patch is effective for 72 hours, after which it  should be removed.  Wrap patch in a tissue and discard in the trash. Wash hands thoroughly with soap and water. °2. You may remove the patch earlier than 72 hours if you experience unpleasant side effects which may include dry mouth, dizziness or visual disturbances. °3. Avoid touching the patch. Wash your hands with soap and water after contact with the patch. °  ° ° ° ° ° ° °

## 2017-04-27 NOTE — Transfer of Care (Signed)
Immediate Anesthesia Transfer of Care Note  Patient: Renee Collier  Procedure(s) Performed: Ultrasound Guided Dilatation and Curettage (N/A Vagina )  Patient Location: PACU  Anesthesia Type:General  Level of Consciousness: awake, alert  and oriented  Airway & Oxygen Therapy: Patient Spontanous Breathing and Patient connected to face mask oxygen  Post-op Assessment: Report given to RN and Post -op Vital signs reviewed and stable  Post vital signs: Reviewed and stable  Last Vitals:  Vitals Value Taken Time  BP    Temp    Pulse 89 04/27/2017 10:58 AM  Resp 13 04/27/2017 10:58 AM  SpO2 100 % 04/27/2017 10:58 AM  Vitals shown include unvalidated device data.  Last Pain:  Vitals:   04/27/17 0929  TempSrc: Oral  PainSc: 0-No pain      Patients Stated Pain Goal: 3 (43/83/81 8403)  Complications: No apparent anesthesia complications

## 2017-04-27 NOTE — Op Note (Signed)
04/27/2017  10:50 AM  PATIENT:  Renee Collier  47 y.o. female  PRE-OPERATIVE DIAGNOSIS:  Pelvic Pain, retained products in uterus Cervical Stenosis  POST-OPERATIVE DIAGNOSIS: same Cervical Stenosis  PROCEDURE:  Procedure(s): Ultrasound Guided Dilatation and Curettage (N/A)  SURGEON:  Surgeon(s) and Role:    * Jenavieve Freda, Wilhemina Cash, MD - Primary  ANESTHESIA:   local and general  EBL:  35 mL   BLOOD ADMINISTERED:none  DRAINS: none   LOCAL MEDICATIONS USED:  MARCAINE     SPECIMEN:  Source of Specimen:  uterine curettings  DISPOSITION OF SPECIMEN:  PATHOLOGY  COUNTS:  YES  TOURNIQUET:  * No tourniquets in log *  DICTATION: .Dragon Dictation  PLAN OF CARE: Discharge to home after PACU  PATIENT DISPOSITION:  PACU - hemodynamically stable.   Delay start of Pharmacological VTE agent (>24hrs) due to surgical blood loss or risk of bleeding: not applicable    The risks, benefits, and alternatives of surgery were explained, understood, and accepted. All questions were answered. Consents were signed. In the operating room general anesthesia was applied without complication, and she was placed in the dorsal lithotomy position. Her vagina was prepped and draped in the usual sterile fashion. A bimanual exam revealed normal size and shaped midplane mobile uterus. There was moderate uterine descensus. Her adnexa were nonenlarged. A speculum was placed and a single-tooth tenaculum was used to grasp the posteriorlip of her cervix. A total of 30 mL of 0.5% Marcaine was used to perform a paracervical block. Her cervix was very stenotic. Ultrasound guidance was used to aid me in finding the endometrium. Eventually, her cervix was dilated enough to accommodate a small curette. Her uterus sounded to 10 cm.  A curettage was done in all quadrants and the fundus of the uterus. A lsmall amount of  tissue was obtained. Some old dark blood came out when the cervix was dilated. A gritty sensation was appreciated  throughout. There was no bleeding noted at the end of the case. She was taken to the recovery room after being extubated. She tolerated the procedure well.

## 2017-04-27 NOTE — H&P (Signed)
Renee Collier is an is a 47 y.o. female. Single P3 (24, 42, and 110 yo kids, 2 grandsons) here today for an ultrasound guided d&c  for pelvic pain. The u/s showed cervical stenosis and a collection of fluid and polypoid tissue. She had a partial ablation (HTA) about 3 years ago. She has no bleeding since procedure.     No LMP recorded. Patient has had an ablation.    Past Medical History:  Diagnosis Date  . Hypertension     Past Surgical History:  Procedure Laterality Date  . ENDOMETRIAL ABLATION    . HYSTEROSCOPY N/A 03/12/2014   Procedure: HYSTEROSCOPY WITH HYDROTHERMAL ABLATION;  Surgeon: Osborne Oman, MD;  Location: [facility];  Service: Gynecology;  Laterality: N/A;  . TUBAL LIGATION      Family History  Problem Relation Age of Onset  . Cancer Mother     Social History:  reports that she has never smoked. She has never used smokeless tobacco. She reports that she does not drink alcohol or use drugs.  Allergies: No Known Allergies  Medications Prior to Admission  Medication Sig Dispense Refill Last Dose  . hydrochlorothiazide (HYDRODIURIL) 25 MG tablet Take 1 tablet (25 mg total) by mouth daily. 90 tablet 3 04/26/2017 at Unknown time  . ibuprofen (ADVIL,MOTRIN) 600 MG tablet Take 1 tablet (600 mg total) by mouth every 6 (six) hours as needed. 30 tablet 1 Past Week at Unknown time  . oxyCODONE-acetaminophen (PERCOCET/ROXICET) 5-325 MG tablet Take 1-2 tablets by mouth every 6 (six) hours as needed. 30 tablet 0 Unknown at Unknown time    ROS  Height 5\' 4"  (1.626 m), weight 98.9 kg (218 lb). Physical Exam  Heart- rrr Lungs- CTAB Abd- benign  Results for orders placed or performed during the hospital encounter of 04/27/17 (from the past 24 hour(s))  CBC     Status: Abnormal   Collection Time: 04/26/17  1:30 PM  Result Value Ref Range   WBC 5.8 4.0 - 10.5 K/uL   RBC 4.67 3.87 - 5.11 MIL/uL   Hemoglobin 13.9 12.0 - 15.0 g/dL   HCT 42.8 36.0 - 46.0 %   MCV 91.6 78.0 -  100.0 fL   MCH 29.8 26.0 - 34.0 pg   MCHC 32.5 30.0 - 36.0 g/dL   RDW 14.1 11.5 - 15.5 %   Platelets 429 (H) 150 - 400 K/uL  Basic metabolic panel     Status: Abnormal   Collection Time: 04/26/17  1:30 PM  Result Value Ref Range   Sodium 137 135 - 145 mmol/L   Potassium 3.7 3.5 - 5.1 mmol/L   Chloride 99 (L) 101 - 111 mmol/L   CO2 28 22 - 32 mmol/L   Glucose, Bld 98 65 - 99 mg/dL   BUN 9 6 - 20 mg/dL   Creatinine, Ser 0.94 0.44 - 1.00 mg/dL   Calcium 9.2 8.9 - 10.3 mg/dL   GFR calc non Af Amer >60 >60 mL/min   GFR calc Af Amer >60 >60 mL/min   Anion gap 10 5 - 15    No results found.  Assessment/Plan: Pelvic pain and intrauterine collection after an ablation- plan for an ultrasound guided d&c.  She understands the risks of surgery, including, but not to infection, bleeding, DVTs, damage to bowel, bladder, ureters. She wishes to proceed.     Renee Collier 04/27/2017, 9:28 AM

## 2017-04-27 NOTE — Anesthesia Preprocedure Evaluation (Signed)
Anesthesia Evaluation  Patient identified by MRN, date of birth, ID band Patient awake    Reviewed: Allergy & Precautions, NPO status , Patient's Chart, lab work & pertinent test results  Airway Mallampati: III  TM Distance: >3 FB Neck ROM: Full    Dental no notable dental hx. (+) Teeth Intact   Pulmonary neg pulmonary ROS,    Pulmonary exam normal breath sounds clear to auscultation       Cardiovascular hypertension, Pt. on medications Normal cardiovascular exam Rhythm:Regular Rate:Normal  12 lead EKG non-specific ST-T wave changes   Neuro/Psych negative neurological ROS     GI/Hepatic negative GI ROS, Neg liver ROS,   Endo/Other  Obesity  Renal/GU negative Renal ROS  negative genitourinary   Musculoskeletal negative musculoskeletal ROS (+)   Abdominal (+) + obese,   Peds  Hematology  (+) anemia ,   Anesthesia Other Findings   Reproductive/Obstetrics AUB                             Anesthesia Physical  Anesthesia Plan  ASA: II  Anesthesia Plan: General   Post-op Pain Management:    Induction: Intravenous  PONV Risk Score and Plan: 3 and Ondansetron, Dexamethasone and Midazolam  Airway Management Planned: LMA  Additional Equipment:   Intra-op Plan:   Post-operative Plan: Extubation in OR  Informed Consent: I have reviewed the patients History and Physical, chart, labs and discussed the procedure including the risks, benefits and alternatives for the proposed anesthesia with the patient or authorized representative who has indicated his/her understanding and acceptance.   Dental advisory given  Plan Discussed with: CRNA, Anesthesiologist and Surgeon  Anesthesia Plan Comments:         Anesthesia Quick Evaluation

## 2017-04-28 ENCOUNTER — Encounter (HOSPITAL_BASED_OUTPATIENT_CLINIC_OR_DEPARTMENT_OTHER): Payer: Self-pay | Admitting: Obstetrics & Gynecology

## 2017-05-11 ENCOUNTER — Telehealth: Payer: Self-pay | Admitting: General Practice

## 2017-05-11 NOTE — Telephone Encounter (Signed)
Patient called and left message requesting results. Called patient & informed her of normal results. Patient verbalized understanding & had no questions.

## 2017-05-30 ENCOUNTER — Ambulatory Visit (INDEPENDENT_AMBULATORY_CARE_PROVIDER_SITE_OTHER): Payer: Self-pay | Admitting: Obstetrics & Gynecology

## 2017-05-30 ENCOUNTER — Encounter: Payer: Self-pay | Admitting: Obstetrics & Gynecology

## 2017-05-30 VITALS — BP 136/97 | HR 95 | Wt 218.8 lb

## 2017-05-30 DIAGNOSIS — Z9889 Other specified postprocedural states: Secondary | ICD-10-CM

## 2017-05-30 DIAGNOSIS — R102 Pelvic and perineal pain: Secondary | ICD-10-CM

## 2017-05-30 NOTE — Progress Notes (Signed)
   Subjective:    Patient ID: Renee Collier, female    DOB: 10/30/1970, 47 y.o.   MRN: [id number]  HPI  47 yo single P3 here for a 4 week post op visit after having an ultrasound-guided d&c for pelvic pain (post ablation syndrome). Her stenotic cervix was dilated and a d&c showed a benign endometrial polyp. She reports that her pain is gone but she has basically been spotting since the procedure 4 weeks ago.   Review of Systems She had a BTL in the past.    Objective:   Physical Exam Breathing, conversing, and ambulating normally Well nourished, well hydrated Black female, no apparent distress   Diagnosis Endometrium, curettage - BENIGN ENDOMETRIAL POLYP. - BENIGN ECTOCERVICAL SQUAMOUS MUCOSA. - NEGATIVE FOR HYPERPLASIA OR MALIGNANCY.      Assessment & Plan:  Pain from post ablation syndrome - resolved DUB- I offered a hysterectomy versus IUD. She wants the IUD and will call when she wants to get it put in. She needs an annual exam with pap smear.

## 2017-06-08 ENCOUNTER — Ambulatory Visit: Payer: Self-pay | Attending: Family Medicine

## 2017-06-20 ENCOUNTER — Ambulatory Visit: Payer: Self-pay | Attending: Family Medicine | Admitting: Family Medicine

## 2017-06-20 ENCOUNTER — Encounter: Payer: Self-pay | Admitting: Family Medicine

## 2017-06-20 VITALS — BP 139/90 | HR 87 | Temp 98.0°F | Ht 64.0 in | Wt 221.4 lb

## 2017-06-20 DIAGNOSIS — Z79899 Other long term (current) drug therapy: Secondary | ICD-10-CM | POA: Insufficient documentation

## 2017-06-20 DIAGNOSIS — R51 Headache: Secondary | ICD-10-CM | POA: Insufficient documentation

## 2017-06-20 DIAGNOSIS — I10 Essential (primary) hypertension: Secondary | ICD-10-CM | POA: Insufficient documentation

## 2017-06-20 DIAGNOSIS — R519 Headache, unspecified: Secondary | ICD-10-CM

## 2017-06-20 DIAGNOSIS — N939 Abnormal uterine and vaginal bleeding, unspecified: Secondary | ICD-10-CM | POA: Insufficient documentation

## 2017-06-20 MED ORDER — BUTALBITAL-APAP-CAFFEINE 50-325-40 MG PO TABS: 1.0000 | ORAL_TABLET | Freq: Three times a day (TID) | ORAL | 0 refills | Status: AC | PRN

## 2017-06-20 MED ORDER — CETIRIZINE HCL 10 MG PO TABS: 10.0000 mg | ORAL_TABLET | Freq: Every day | ORAL | 1 refills | Status: DC

## 2017-06-20 MED ORDER — HYDROCHLOROTHIAZIDE 25 MG PO TABS: 25.0000 mg | ORAL_TABLET | Freq: Every day | ORAL | 6 refills | Status: DC

## 2017-06-20 MED FILL — BUTALB-ACETAMIN-CAFF 50-325: 50-325-40 | 10 days supply | Qty: 30 | Fill #0

## 2017-06-20 MED FILL — HYDROCHLOROTHIAZIDE 25 MG T: 25 | 30 days supply | Qty: 30 | Fill #0

## 2017-06-20 NOTE — Patient Instructions (Signed)

## 2017-06-20 NOTE — Progress Notes (Signed)
Subjective:  Patient ID: Renee Collier, female    DOB: 1970/07/31  Age: 47 y.o. MRN: [id number]  CC: Establish Care   HPI Renee Collier is a 47 year old female with a history of hypertension who presents today to establish care with me as she was previously followed by Fredia Beets, FNP.  Who is no longer with the practice. She is status post ultrasound-guided D&C on 04/27/2017 for pelvic pain, retained products in utero and cervical stenosis (status post partial ablation 3 years ago).  Findings revealed endometrial polyps which were benign and she had a follow-up with GYN On 05/30/2017.  She informs me today she continues to have vaginal spotting ever since and this could last about 6 days with a few days break in between and she has to wear a pad every day; she denies pelvic pain.  Review of GYN note indicates she was given the option for IUD versus hysterectomy. The patient informs me she is undecided about an IUD yet. She has been compliant with her antihypertensive and denies adverse effects. Complains of frontal headaches which have occurred for the last 8 days with no complains of nausea vomiting but sometimes has photophobia.  Headaches are described as mild to moderate and is sometimes relieved by lying down.  Past Medical History:  Diagnosis Date  . Hypertension     Past Surgical History:  Procedure Laterality Date  . DILATION AND CURETTAGE OF UTERUS N/A 04/27/2017   Procedure: Ultrasound Guided Dilatation and Curettage;  Surgeon: [name], MD;  Location: [facility];  Service: Gynecology;  Laterality: N/A;  . ENDOMETRIAL ABLATION    . HYSTEROSCOPY N/A 03/12/2014   Procedure: HYSTEROSCOPY WITH HYDROTHERMAL ABLATION;  Surgeon: Osborne Oman, MD;  Location: [facility];  Service: Gynecology;  Laterality: N/A;  . TUBAL LIGATION      No Known Allergies   Outpatient Medications Prior to Visit  Medication Sig Dispense Refill  . hydrochlorothiazide  (HYDRODIURIL) 25 MG tablet Take 1 tablet (25 mg total) by mouth daily. 90 tablet 3  . ibuprofen (ADVIL,MOTRIN) 600 MG tablet Take 1 tablet (600 mg total) by mouth every 6 (six) hours as needed. (Patient not taking: Reported on 05/30/2017) 30 tablet 1  . oxyCODONE-acetaminophen (PERCOCET/ROXICET) 5-325 MG tablet Take 1-2 tablets by mouth every 6 (six) hours as needed. (Patient not taking: Reported on 05/30/2017) 30 tablet 0   No facility-administered medications prior to visit.     ROS Review of Systems  Constitutional: Negative for activity change, appetite change and fatigue.  HENT: Negative for congestion, sinus pressure and sore throat.   Eyes: Negative for visual disturbance.  Respiratory: Negative for cough, chest tightness, shortness of breath and wheezing.   Cardiovascular: Negative for chest pain and palpitations.  Gastrointestinal: Negative for abdominal distention, abdominal pain and constipation.  Endocrine: Negative for polydipsia.  Genitourinary: Positive for menstrual problem. Negative for dysuria and frequency.  Musculoskeletal: Negative for arthralgias and back pain.  Skin: Negative for rash.  Neurological: Negative for tremors, light-headedness and numbness.  Hematological: Does not bruise/bleed easily.  Psychiatric/Behavioral: Negative for agitation and behavioral problems.    Objective:  BP 139/90   Pulse 87   Temp 98 F (36.7 C) (Oral)   Ht 5\' 4"  (1.626 m)   Wt 221 lb 6.4 oz (100.4 kg)   LMP 05/24/2017 (Exact Date)   SpO2 97%   BMI 38.00 kg/m   BP/Weight 06/20/2017 05/30/2017 07/31/5282  Systolic BP 132 440 102  Diastolic BP 90  97 94  Wt. (Lbs) 221.4 218.8 220  BMI 38 37.56 37.76      Physical Exam  Constitutional: She is oriented to person, place, and time. She appears well-developed and well-nourished.  Cardiovascular: Normal rate, normal heart sounds and intact distal pulses.  No murmur heard. Pulmonary/Chest: Effort normal and breath sounds normal.  She has no wheezes. She has no rales. She exhibits no tenderness.  Abdominal: Soft. Bowel sounds are normal. She exhibits no distension and no mass. There is no tenderness.  Musculoskeletal: Normal range of motion.  Neurological: She is alert and oriented to person, place, and time.  Skin: Skin is warm and dry.  Psychiatric: She has a normal mood and affect.    CMP Latest Ref Rng & Units 04/26/2017 01/04/2017 12/14/2016  Glucose 65 - 99 mg/dL 98 88 99  BUN 6 - 20 mg/dL 9 10 11   Creatinine 0.44 - 1.00 mg/dL 0.94 0.99 1.04(H)  Sodium 135 - 145 mmol/L 137 141 139  Potassium 3.5 - 5.1 mmol/L 3.7 4.4 4.1  Chloride 101 - 111 mmol/L 99(L) 100 99  CO2 22 - 32 mmol/L 28 26 25   Calcium 8.9 - 10.3 mg/dL 9.2 9.8 9.7  Total Protein 6.0 - 8.5 g/dL - 8.0 7.8  Total Bilirubin 0.0 - 1.2 mg/dL - <0.2 0.4  Alkaline Phos 39 - 117 IU/L - 99 105  AST 0 - 40 IU/L - 14 11  ALT 0 - 32 IU/L - 9 7    Lipid Panel     Component Value Date/Time   CHOL 159 01/04/2017 1212   TRIG 134 01/04/2017 1212   HDL 55 01/04/2017 1212   CHOLHDL 2.9 01/04/2017 1212   CHOLHDL 2.4 Ratio 11/23/2006 2033   VLDL 20 11/23/2006 2033   LDLCALC 77 01/04/2017 1212      Assessment & Plan:   1. Hypertension, unspecified type Controlled Sodium, DASH diet - hydrochlorothiazide (HYDRODIURIL) 25 MG tablet; Take 1 tablet (25 mg total) by mouth daily.  Dispense: 30 tablet; Refill: 6  2. Abnormal uterine bleeding Status post ultrasound-guided D&C; recently had follow-up with GYN. She continues to have spotting postprocedure and is undecided about IUD and we have discussed various options of management- she will get back to the clinic once she arrives at a decision.  3.  Sinus headache Commenced on Zyrtec We will also placed on Fioricet  Meds ordered this encounter  Medications  . cetirizine (ZYRTEC) 10 MG tablet    Sig: Take 1 tablet (10 mg total) by mouth daily.    Dispense:  30 tablet    Refill:  1  .  hydrochlorothiazide (HYDRODIURIL) 25 MG tablet    Sig: Take 1 tablet (25 mg total) by mouth daily.    Dispense:  30 tablet    Refill:  6  . butalbital-acetaminophen-caffeine (FIORICET, ESGIC) 50-325-40 MG tablet    Sig: Take 1 tablet by mouth every 8 (eight) hours as needed for headache.    Dispense:  30 tablet    Refill:  0    Follow-up: Return in about 1 month (around 07/21/2017) for complete physical exam.   Charlott Rakes MD

## 2017-08-05 ENCOUNTER — Ambulatory Visit: Payer: Self-pay | Attending: Family Medicine

## 2017-08-16 ENCOUNTER — Ambulatory Visit: Payer: Self-pay | Attending: Family Medicine | Admitting: Family Medicine

## 2017-08-16 ENCOUNTER — Encounter: Payer: Self-pay | Admitting: Family Medicine

## 2017-08-16 ENCOUNTER — Encounter: Payer: Self-pay | Admitting: Obstetrics & Gynecology

## 2017-08-16 VITALS — BP 132/83 | HR 84 | Temp 98.0°F | Ht 64.0 in | Wt 224.6 lb

## 2017-08-16 DIAGNOSIS — I10 Essential (primary) hypertension: Secondary | ICD-10-CM | POA: Insufficient documentation

## 2017-08-16 DIAGNOSIS — Z0000 Encounter for general adult medical examination without abnormal findings: Secondary | ICD-10-CM | POA: Insufficient documentation

## 2017-08-16 DIAGNOSIS — Z79899 Other long term (current) drug therapy: Secondary | ICD-10-CM | POA: Insufficient documentation

## 2017-08-16 DIAGNOSIS — Z1231 Encounter for screening mammogram for malignant neoplasm of breast: Secondary | ICD-10-CM

## 2017-08-16 DIAGNOSIS — Z124 Encounter for screening for malignant neoplasm of cervix: Secondary | ICD-10-CM

## 2017-08-16 DIAGNOSIS — N946 Dysmenorrhea, unspecified: Secondary | ICD-10-CM | POA: Insufficient documentation

## 2017-08-16 DIAGNOSIS — Z1239 Encounter for other screening for malignant neoplasm of breast: Secondary | ICD-10-CM

## 2017-08-16 DIAGNOSIS — Z9889 Other specified postprocedural states: Secondary | ICD-10-CM | POA: Insufficient documentation

## 2017-08-16 NOTE — Patient Instructions (Signed)

## 2017-08-16 NOTE — Progress Notes (Signed)
Subjective:  Patient ID: Renee Collier, female    DOB: 31-May-1970  Age: 47 y.o. MRN: [id number]  CC: Gynecologic Exam   HPI Renee Collier presents for complete physical exam.  She continues to experience dysmenorrhea which has worsened after her uterine ablation in 04/2017. Currently complains of pelvic cramping and is expecting her cycle to come on next week.  At her follow-up visit with GYN the issue of IUD versus hysterectomy have been discussed but  she informs me today she is scared of having an IUD but is open to going back to see GYN. Last Pap smear was from 03/2010 which revealed atypical glandular cells, HPV negative.  Past Medical History:  Diagnosis Date  . Hypertension     Past Surgical History:  Procedure Laterality Date  . DILATION AND CURETTAGE OF UTERUS N/A 04/27/2017   Procedure: Ultrasound Guided Dilatation and Curettage;  Surgeon: [name], MD;  Location: [facility];  Service: Gynecology;  Laterality: N/A;  . ENDOMETRIAL ABLATION    . HYSTEROSCOPY N/A 03/12/2014   Procedure: HYSTEROSCOPY WITH HYDROTHERMAL ABLATION;  Surgeon: Osborne Oman, MD;  Location: [facility];  Service: Gynecology;  Laterality: N/A;  . TUBAL LIGATION      No Known Allergies    Outpatient Medications Prior to Visit  Medication Sig Dispense Refill  . butalbital-acetaminophen-caffeine (FIORICET, ESGIC) 50-325-40 MG tablet Take 1 tablet by mouth every 8 (eight) hours as needed for headache. 30 tablet 0  . cetirizine (ZYRTEC) 10 MG tablet Take 1 tablet (10 mg total) by mouth daily. 30 tablet 1  . hydrochlorothiazide (HYDRODIURIL) 25 MG tablet Take 1 tablet (25 mg total) by mouth daily. 30 tablet 6  . ibuprofen (ADVIL,MOTRIN) 600 MG tablet Take 1 tablet (600 mg total) by mouth every 6 (six) hours as needed. (Patient not taking: Reported on 05/30/2017) 30 tablet 1  . oxyCODONE-acetaminophen (PERCOCET/ROXICET) 5-325 MG tablet Take 1-2 tablets by mouth every 6 (six) hours as needed.  (Patient not taking: Reported on 05/30/2017) 30 tablet 0   No facility-administered medications prior to visit.     ROS Review of Systems  Constitutional: Negative for activity change, appetite change and fatigue.  HENT: Negative for congestion, sinus pressure and sore throat.   Eyes: Negative for visual disturbance.  Respiratory: Negative for cough, chest tightness, shortness of breath and wheezing.   Cardiovascular: Negative for chest pain and palpitations.  Gastrointestinal: Negative for abdominal distention, abdominal pain and constipation.  Endocrine: Negative for polydipsia.  Genitourinary: Negative for dysuria and frequency.  Musculoskeletal: Negative for arthralgias and back pain.  Skin: Negative for rash.  Neurological: Negative for tremors, light-headedness and numbness.  Hematological: Does not bruise/bleed easily.  Psychiatric/Behavioral: Negative for agitation and behavioral problems.    Objective:  BP 132/83   Pulse 84   Temp 98 F (36.7 C) (Oral)   Ht 5' 4" (1.626 m)   Wt 224 lb 9.6 oz (101.9 kg)   SpO2 99%   BMI 38.55 kg/m   BP/Weight 08/16/2017 06/20/2017 05/10/1446  Systolic BP 185 631 497  Diastolic BP 83 90 97  Wt. (Lbs) 224.6 221.4 218.8  BMI 38.55 38 37.56      Physical Exam  Constitutional: She is oriented to person, place, and time. She appears well-developed and well-nourished.  Cardiovascular: Normal rate, normal heart sounds and intact distal pulses.  No murmur heard. Pulmonary/Chest: Effort normal and breath sounds normal. She has no wheezes. She has no rales. She exhibits no tenderness.  Abdominal: Soft. Bowel sounds are normal. She exhibits no distension and no mass. There is no tenderness.  Musculoskeletal: Normal range of motion.  Neurological: She is alert and oriented to person, place, and time.  Skin: Skin is warm and dry.  Psychiatric: She has a normal mood and affect.     Assessment & Plan:   1. Annual physical exam Counseled  on 150 minutes of exercise per week, healthy eating (including decreased daily intake of saturated fats, cholesterol, added sugars, sodium), STI prevention, routine healthcare maintenance. - Cytology - PAP([facility]) - HIV antibody (with reflex) - CMP14+EGFR  2. Status post HTA endometrial ablation - Ambulatory referral to Gynecology  3. Screening for breast cancer - MM Digital Screening; Future  4. Screening for cervical cancer PAP smear ordered.   No orders of the defined types were placed in this encounter.   Follow-up: Return in about 3 months (around 11/16/2017) for Follow-up of chronic medical conditions.     MD   

## 2017-08-17 LAB — CMP14+EGFR
ALK PHOS: 100 IU/L (ref 39–117)
ALT: 7 IU/L (ref 0–32)
AST: 15 IU/L (ref 0–40)
Albumin/Globulin Ratio: 1.4 (ref 1.2–2.2)
Albumin: 4 g/dL (ref 3.5–5.5)
BUN/Creatinine Ratio: 10 (ref 9–23)
BUN: 10 mg/dL (ref 6–24)
Bilirubin Total: 0.2 mg/dL (ref 0.0–1.2)
CALCIUM: 9.4 mg/dL (ref 8.7–10.2)
CO2: 22 mmol/L (ref 20–29)
Chloride: 103 mmol/L (ref 96–106)
Creatinine, Ser: 0.99 mg/dL (ref 0.57–1.00)
GFR calc Af Amer: 80 mL/min/{1.73_m2} (ref >59)
GFR calc non Af Amer: 70 mL/min/{1.73_m2} (ref >59)
GLOBULIN, TOTAL: 2.9 g/dL (ref 1.5–4.5)
GLUCOSE: 99 mg/dL (ref 65–99)
Potassium: 3.8 mmol/L (ref 3.5–5.2)
SODIUM: 142 mmol/L (ref 134–144)
Total Protein: 6.9 g/dL (ref 6.0–8.5)

## 2017-08-17 LAB — HIV ANTIBODY (ROUTINE TESTING W REFLEX): HIV SCREEN 4TH GENERATION: NONREACTIVE

## 2017-08-18 ENCOUNTER — Telehealth: Payer: Self-pay

## 2017-08-18 NOTE — Telephone Encounter (Signed)
Patient was called and informed of lab results. Patient was informed that Pap results are not in yet.

## 2017-08-19 LAB — CERVICOVAGINAL ANCILLARY ONLY: Herpes: NEGATIVE

## 2017-08-22 ENCOUNTER — Telehealth: Payer: Self-pay | Admitting: Family Medicine

## 2017-08-22 LAB — CYTOLOGY - PAP
ADEQUACY: ABSENT
CANDIDA VAGINITIS: NEGATIVE
CHLAMYDIA, DNA PROBE: NEGATIVE
Diagnosis: NEGATIVE
HPV (WINDOPATH): DETECTED — AB
HPV 16/18/45 genotyping: NEGATIVE
Neisseria Gonorrhea: NEGATIVE
TRICH (WINDOWPATH): NEGATIVE

## 2017-08-22 NOTE — Telephone Encounter (Signed)
Will route to PCP for review. 

## 2017-08-22 NOTE — Telephone Encounter (Signed)
Pt called states she needs a mammogram but is needing to be referred by PCP

## 2017-08-22 NOTE — Telephone Encounter (Signed)
I made that referral at her last office visit with me on 08/16/2017.

## 2017-08-25 NOTE — Telephone Encounter (Signed)
Patient was called and a voicemail was left informing patient to contact [facility] to schedule mammogram.

## 2017-08-26 ENCOUNTER — Telehealth: Payer: Self-pay

## 2017-08-26 NOTE — Telephone Encounter (Signed)
Patient was called and informed of lab results. 

## 2017-09-02 ENCOUNTER — Other Ambulatory Visit: Payer: Self-pay | Admitting: Obstetrics and Gynecology

## 2017-09-02 DIAGNOSIS — Z1231 Encounter for screening mammogram for malignant neoplasm of breast: Secondary | ICD-10-CM

## 2017-09-16 MED FILL — IBUPROFEN 600 MG TABLET: 600 | 7 days supply | Qty: 30 | Fill #1

## 2017-09-16 MED FILL — HYDROCHLOROTHIAZIDE 25 MG T: 25 | 30 days supply | Qty: 30 | Fill #1

## 2017-09-20 ENCOUNTER — Ambulatory Visit: Payer: Self-pay | Admitting: Family Medicine

## 2017-09-26 ENCOUNTER — Ambulatory Visit (INDEPENDENT_AMBULATORY_CARE_PROVIDER_SITE_OTHER): Payer: Self-pay | Admitting: Obstetrics & Gynecology

## 2017-09-26 ENCOUNTER — Encounter: Payer: Self-pay | Admitting: Obstetrics & Gynecology

## 2017-09-26 ENCOUNTER — Ambulatory Visit (INDEPENDENT_AMBULATORY_CARE_PROVIDER_SITE_OTHER): Payer: Self-pay | Admitting: Clinical

## 2017-09-26 VITALS — BP 150/81 | HR 78

## 2017-09-26 DIAGNOSIS — R102 Pelvic and perineal pain: Secondary | ICD-10-CM

## 2017-09-26 DIAGNOSIS — F4321 Adjustment disorder with depressed mood: Secondary | ICD-10-CM

## 2017-09-26 DIAGNOSIS — F3281 Premenstrual dysphoric disorder: Secondary | ICD-10-CM

## 2017-09-26 MED ORDER — ELAGOLIX SODIUM 150 MG PO TABS: 1.0000 | ORAL_TABLET | Freq: Two times a day (BID) | ORAL | 12 refills | Status: DC

## 2017-09-26 NOTE — Progress Notes (Signed)
   Subjective:    Patient ID: Renee Collier, female    DOB: [birth year], 47 y.o.   MRN: [id number]  HPI 47 yo single P3 (all daughters) here for a post op visit. She had a d&c and endometrial ablation for DUB 04/27/17.  She has had some periods. The blood is no longer enough to "irrate" her but she is feeling lower back pain, left groin pain, and vaginal pain, with her periods and when she has sex. She has seen the commercial for [place] and would like to try this.    Review of Systems She had a BTL after her last child 21 years ago.    Objective:   Physical Exam Breathing, conversing, and ambulating normally Well nourished, well hydrated Black female, no apparent distress 1st degree uterine prolapse normal size and shape, anteverted, mobile, non-tender, normal adnexal exam Reasonable pubic arch for TVH prn    Assessment & Plan:  Pelvic pain- possible endometriosis Trial of orilissa 150 mg BID Come back 3 months She will need to go to [place] for her elevated BPs (she did not take her meds today)

## 2017-09-26 NOTE — BH Specialist Note (Addendum)
Integrated Behavioral Health Initial Visit  MRN: [id number] Name: Renee Collier  Number of [place] Clinician visits:: 1/6 Session Start time: 10:53  Session End time: 11:19 Total time: 20 minutes  Type of Service: [place] Interpretor:No. Interpretor Name and Language: N/A   Warm Hand Off Completed.       SUBJECTIVE: Renee Collier is a 47 y.o. female accompanied by n/a Patient was referred by Clovia Cuff, MD for depression. Patient reports the following symptoms/concerns: Pt states her primary concern today is feeling depressed, with fatigue, worry, anhedonia,change in appetite, overwhelmed with  life stress, and chronic pain, with symptoms precipitating menstrual cycle by at least one week monthly;  pt open to learning self-coping strategy today to help gain control over the related pain and stress response.  Duration of problem: ; Severity of problem: moderate  OBJECTIVE: Mood: Depressed and Affect: Tearful Risk of harm to self or others: No plan to harm self or others  LIFE CONTEXT: Family and Social: - School/Work: Works two jobs Self-Care: Recognizing a greater need for self care Life Changes: Increase in physical pain, associated with menstrual cycle  GOALS ADDRESSED: Patient will: 1. Reduce symptoms of: depression and stress 2. Increase knowledge and/or ability of: self-management skills and stress reduction  3. Demonstrate ability to: Increase healthy adjustment to current life circumstances  INTERVENTIONS: Interventions utilized: Mindfulness or Psychologist, educational, Psychoeducation and/or Health Education and Link to [employer]  Standardized Assessments completed: GAD-7 and PHQ 9  ASSESSMENT: Patient currently experiencing Adjustment disorder with depressed mood.     Patient may benefit from psychoeducation and brief therapeutic interventions regarding coping with symptoms of depression, along with  further assessment of symptoms of PMDD, provisional .  PLAN: 1. Follow up with behavioral health clinician on : As needed 2. Behavioral recommendations:  -CALM relaxation breathing exercise every morning; as needed throughout the day, when pain begins to increase -Read educational material regarding coping with symptoms of depression -Consider [facility] for additional support 3. Referral(s): [facility] (In Clinic) and [employer] Resources:  [employer] 4. "From scale of 1-10, how likely are you to follow plan?": 9  Garlan Fair, LCSW  Depression screen [facility] 2/9 09/26/2017 06/20/2017 02/28/2017 01/04/2017 12/14/2016  Decreased Interest 3 0 3 1 0  Down, Depressed, Hopeless 1 0 0 0 0  PHQ - 2 Score 4 0 3 1 0  Altered sleeping 1 3 0 0 0  Tired, decreased energy 3 3 3 2 1   Change in appetite 2 0 0 1 0  Feeling bad or failure about yourself  0 0 0 0 0  Trouble concentrating 0 0 0 0 0  Moving slowly or fidgety/restless 0 0 0 0 0  Suicidal thoughts 0 0 0 0 0  PHQ-9 Score 10 6 6 4 1    GAD 7 : Generalized Anxiety Score 09/26/2017 06/20/2017 02/28/2017 01/04/2017  Nervous, Anxious, on Edge 0 0 0 0  Control/stop worrying 0 3 0 0  Worry too much - different things 0 3 0 0  Trouble relaxing 0 3 0 0  Restless 0 0 0 0  Easily annoyed or irritable 0 1 0 0  Afraid - awful might happen 0 0 0 0  Total GAD 7 Score 0 10 0 0

## 2017-10-13 ENCOUNTER — Ambulatory Visit: Payer: Self-pay

## 2017-10-16 ENCOUNTER — Other Ambulatory Visit: Payer: Self-pay

## 2017-10-16 ENCOUNTER — Encounter (HOSPITAL_COMMUNITY): Payer: Self-pay

## 2017-10-16 ENCOUNTER — Emergency Department (HOSPITAL_COMMUNITY)
Admission: EM | Admit: 2017-10-16 | Discharge: 2017-10-16 | Disposition: A | Discharge status: Home / Self Care | Payer: Self-pay | Attending: Emergency Medicine | Admitting: Emergency Medicine

## 2017-10-16 ENCOUNTER — Emergency Department (HOSPITAL_COMMUNITY): Payer: Self-pay

## 2017-10-16 DIAGNOSIS — M5442 Lumbago with sciatica, left side: Secondary | ICD-10-CM | POA: Insufficient documentation

## 2017-10-16 DIAGNOSIS — Z79899 Other long term (current) drug therapy: Secondary | ICD-10-CM | POA: Insufficient documentation

## 2017-10-16 DIAGNOSIS — I10 Essential (primary) hypertension: Secondary | ICD-10-CM | POA: Insufficient documentation

## 2017-10-16 DIAGNOSIS — M25552 Pain in left hip: Secondary | ICD-10-CM | POA: Insufficient documentation

## 2017-10-16 LAB — URINALYSIS, ROUTINE W REFLEX MICROSCOPIC
Bilirubin Urine: NEGATIVE
Glucose, UA: NEGATIVE mg/dL
Hgb urine dipstick: NEGATIVE
Ketones, ur: NEGATIVE mg/dL
Leukocytes, UA: NEGATIVE
Nitrite: NEGATIVE
Protein, ur: NEGATIVE mg/dL
Specific Gravity, Urine: 1.014 (ref 1.005–1.030)
pH: 6 (ref 5.0–8.0)

## 2017-10-16 MED ORDER — CYCLOBENZAPRINE HCL 10 MG PO TABS
10.0000 mg | ORAL_TABLET | Freq: Once | ORAL | Status: AC
  Administered 2017-10-16: 10 mg via ORAL
  Filled 2017-10-16: qty 1

## 2017-10-16 MED ORDER — CYCLOBENZAPRINE HCL 10 MG PO TABS: 10.0000 mg | ORAL_TABLET | Freq: Two times a day (BID) | ORAL | 0 refills | Status: DC | PRN

## 2017-10-16 MED ORDER — ACETAMINOPHEN 500 MG PO TABS: 500.0000 mg | ORAL_TABLET | Freq: Four times a day (QID) | ORAL | 0 refills | Status: DC | PRN

## 2017-10-16 MED ORDER — HYDROCODONE-ACETAMINOPHEN 5-325 MG PO TABS
1.0000 | ORAL_TABLET | Freq: Once | ORAL | Status: AC
  Administered 2017-10-16: 1 via ORAL
  Filled 2017-10-16: qty 1

## 2017-10-16 MED ORDER — PREDNISONE 10 MG (21) PO TBPK: ORAL_TABLET | Freq: Every day | ORAL | 0 refills | Status: DC

## 2017-10-16 NOTE — Discharge Instructions (Addendum)
1. Medications: Take steroid taper as prescribed.  Do not take ibuprofen, Aleve, Advil, or Motrin while taking this medicine.  Take steroid with food to avoid upset stomach issues.  Also take [phone] mg of Tylenol every 6 hours as needed for pain. Do not exceed 4000 mg of Tylenol daily. You can take Flexeril as needed for muscle spasm up to twice daily but do not drive, drink alcohol, or operate heavy machinery while taking this medicine because it may make you drowsy.  I typically recommend taking this medicine only at night when you are going to sleep.  He can also cut these tablets in half if they make you feel very drowsy. 2. Treatment: rest, drink plenty of fluids, gentle stretching as discussed (see attached, or YouTube physical therapy for back or hip), alternate ice and heat (or stick with whichever feels best) 20 minutes on 20 minutes off. 3. Follow Up: Please followup with your primary doctor in 3-7 days for discussion of your diagnoses and further evaluation after today's visit; if you do not have a primary care doctor use the resource guide provided to find one;  Return to the ER for worsening back pain, difficulty walking, loss of bowel or bladder control or other concerning symptoms  Follow-up with your OB/GYN for reevaluation of the findings on your pelvic ultrasound today.

## 2017-10-16 NOTE — ED Triage Notes (Addendum)
Pt reports L leg pain since Monday. She states that it is a shooting pain that starts in her hip and radiates down her legs. A&Ox4. Ambulatory. She reports a known hx of HTN and states that she took her medication this morning.

## 2017-10-16 NOTE — ED Provider Notes (Signed)
[facility] DEPT Provider Note   CSN: [id number] Arrival date & time: 10/16/17  [place]     History   Chief Complaint Chief Complaint  Patient presents with  . Leg Pain    L    HPI Eimy Credit is a 47 y.o. female with history of hypertension uterine bleeding presents for evaluation of acute onset, progressively worsening left-sided pelvic pain and left lower extremity pain for 6 days.  The pain began suddenly and was initially intermittent but is now constant.  She states the pain begins in the left pelvic region and radiates around to the low back, left hip, and down the anterior aspect of her left lower extremity.  It stops at around the ankle.  The pain is sharp and stabbing, worsens with bending, position changes, and ambulation.  She notes a tingling sensation to the distal left lower leg.  Denies saddle anesthesia, urinary symptoms, bowel or bladder incontinence, fevers, chills.  Denies any trauma or falls.  Has tried ibuprofen and topical over-the-counter pain patches without relief of her symptoms.  She states last time she felt similar pain was prior to undergoing D&C for endometriosis.  Denies vaginal itching, bleeding, discharge, or pain out of the ordinary.  The history is provided by the patient.    Past Medical History:  Diagnosis Date  . Hypertension     Patient Active Problem List   Diagnosis Date Noted  . Status post HTA endometrial ablation 04/10/2014  . Abnormal uterine bleeding (AUB) 12/18/2013    Past Surgical History:  Procedure Laterality Date  . DILATION AND CURETTAGE OF UTERUS N/A 04/27/2017   Procedure: Ultrasound Guided Dilatation and Curettage;  Surgeon: [name], MD;  Location: [facility];  Service: Gynecology;  Laterality: N/A;  . ENDOMETRIAL ABLATION    . HYSTEROSCOPY N/A 03/12/2014   Procedure: HYSTEROSCOPY WITH HYDROTHERMAL ABLATION;  Surgeon: Osborne Oman, MD;  Location: [facility];  Service:  Gynecology;  Laterality: N/A;  . TUBAL LIGATION       OB History    Gravida  3   Para  3   Term  3   Preterm      AB      Living  3     SAB      TAB      Ectopic      Multiple      Live Births  3            Home Medications    Prior to Admission medications   Medication Sig Start Date End Date Taking? Authorizing Provider  acetaminophen (TYLENOL) 500 MG tablet Take 1 tablet (500 mg total) by mouth every 6 (six) hours as needed. 10/16/17   Candy Ziegler A, PA-C  butalbital-acetaminophen-caffeine (FIORICET, ESGIC) [phone] MG tablet Take 1 tablet by mouth every 8 (eight) hours as needed for headache. 06/20/17 06/20/18  Charlott Rakes, MD  cyclobenzaprine (FLEXERIL) 10 MG tablet Take 1 tablet (10 mg total) by mouth 2 (two) times daily as needed for muscle spasms. 10/16/17   Shauntea Lok A, PA-C  Elagolix Sodium (ORILISSA) 150 MG TABS Take 1 tablet by mouth 2 (two) times daily. 09/26/17   [name], MD  hydrochlorothiazide (HYDRODIURIL) 25 MG tablet Take 1 tablet (25 mg total) by mouth daily. 06/20/17   Charlott Rakes, MD  ibuprofen (ADVIL,MOTRIN) 600 MG tablet Take 1 tablet (600 mg total) by mouth every 6 (six) hours as needed. 04/27/17   Dove,  Myra C, MD  predniSONE (STERAPRED UNI-PAK 21 TAB) 10 MG (21) TBPK tablet Take by mouth daily. Take 6 tabs by mouth daily  for 2 days, then 5 tabs for 2 days, then 4 tabs for 2 days, then 3 tabs for 2 days, 2 tabs for 2 days, then 1 tab by mouth daily for 2 days 10/16/17   Renita Papa, PA-C    Family History Family History  Problem Relation Age of Onset  . Cancer Mother     Social History Social History   Tobacco Use  . Smoking status: Never Smoker  . Smokeless tobacco: Never Used  Substance Use Topics  . Alcohol use: No  . Drug use: No     Allergies   Patient has no known allergies.   Review of Systems Review of Systems  Constitutional: Negative for chills and fever.  Gastrointestinal: Negative for abdominal  pain, blood in stool, constipation, diarrhea, nausea and vomiting.  Genitourinary: Positive for pelvic pain. Negative for decreased urine volume, dyspareunia, dysuria, flank pain, hematuria, urgency, vaginal bleeding, vaginal discharge and vaginal pain.  Musculoskeletal: Positive for back pain.  Neurological: Positive for numbness. Negative for weakness.  All other systems reviewed and are negative.    Physical Exam Updated Vital Signs BP (!) 174/104 (BP Location: Left Arm)   Pulse 84   Temp 99 F (37.2 C) (Oral)   Resp 18   Ht 5\' 4"  (1.626 m)   Wt 97.5 kg   SpO2 100%   BMI 36.90 kg/m   Physical Exam  Constitutional: She appears well-developed and well-nourished. No distress.  HENT:  Head: Normocephalic and atraumatic.  Eyes: Conjunctivae are normal. Right eye exhibits no discharge. Left eye exhibits no discharge.  Neck: No JVD present. No tracheal deviation present.  Cardiovascular: Normal rate and intact distal pulses.  2+ radial and DP/PT pulses bilaterally, Homans sign absent bilaterally, no lower extremity edema, no palpable cords, compartments are soft   Pulmonary/Chest: Effort normal.  Abdominal: Soft. Bowel sounds are normal. She exhibits no distension. There is tenderness in the left lower quadrant. There is no rigidity, no rebound, no guarding and no CVA tenderness.  Tenderness to palpation in the left lower quadrant along the pelvic brim.  Musculoskeletal: She exhibits tenderness. She exhibits no edema.  No midline spine tenderness, left-sided paralumbar muscle tenderness.  No deformity, crepitus, or step-off noted.  There is tenderness to palpation of the anterior and lateral aspect of the left hip with pain elicited with flexion of the left hip even passively.  5/5 strength of BLE major muscle groups.  No deformity, crepitus, or step-off noted.  Normal passive range of motion of the left hip.  Pain worsens with flexion and extension of the lumbar spine.  Neurological:  She is alert. A sensory deficit is present.  Fluent speech, no facial droop, altered sensation to soft touch of the distal left lower extremity.  Ambulates with an antalgic gait but exhibits good balance, able to Heel Walk and Toe Walk with some assistance.  Skin: Skin is warm and dry. No erythema.  Psychiatric: She has a normal mood and affect. Her behavior is normal.  Nursing note and vitals reviewed.    ED Treatments / Results  Labs (all labs ordered are listed, but only abnormal results are displayed) Labs Reviewed  URINALYSIS, ROUTINE W REFLEX MICROSCOPIC - Abnormal; Notable for the following components:      Result Value   APPearance HAZY (*)    All other  components within normal limits  POC URINE PREG, ED    EKG None  Radiology US Transvaginal Non-ob  Result Date: 10/16/2017 CLINICAL DATA:  Pelvic pain over the last week. EXAM: TRANSABDOMINAL AND TRANSVAGINAL ULTRASOUND OF PELVIS DOPPLER ULTRASOUND OF OVARIES TECHNIQUE: Both transabdominal and transvaginal ultrasound examinations of the pelvis were performed. Transabdominal technique was performed for global imaging of the pelvis including uterus, ovaries, adnexal regions, and pelvic cul-de-sac. It was necessary to proceed with endovaginal exam following the transabdominal exam to visualize the uterus and ovaries more clearly. Color and duplex Doppler ultrasound was utilized to evaluate blood flow to the ovaries. COMPARISON:  04/27/2017.  03/03/2017.  12/18/2013. FINDINGS: Uterus Measurements: 8.2 x 5.1 x 5.8 cm. Multiple small leiomyomas. Largest measures 2 cm near the fundus. Endometrium Thickness: 11 mm.  No focal abnormality visualized. Right ovary Measurements: 4.0 x 3.8 x 3.8 cm. Two adjacent cysts, measuring to gather 3.9 x 2.7 x 2.5 cm. These could be functional cysts, endometriomas or tubo-ovarian abscess. Left ovary Measurements: 2.6 x 1.8 x 1.7 cm. Normal appearance/no adnexal mass. Pulsed Doppler evaluation of both ovaries  demonstrates normal low-resistance arterial and venous waveforms. Other findings No abnormal free fluid. IMPRESSION: Several small leiomyomas of the uterus. Two adjacent cystic areas associated with the right ovary measuring together 3.9 x 2.7 x 2.5 cm. These are nonspecific and could represent functional cysts, endometrioma or tubo-ovarian abscess. Electronically Signed   By: Nelson Chimes M.D.   On: 10/16/2017 20:25   US Pelvis Complete  Result Date: 10/16/2017 CLINICAL DATA:  Pelvic pain over the last week. EXAM: TRANSABDOMINAL AND TRANSVAGINAL ULTRASOUND OF PELVIS DOPPLER ULTRASOUND OF OVARIES TECHNIQUE: Both transabdominal and transvaginal ultrasound examinations of the pelvis were performed. Transabdominal technique was performed for global imaging of the pelvis including uterus, ovaries, adnexal regions, and pelvic cul-de-sac. It was necessary to proceed with endovaginal exam following the transabdominal exam to visualize the uterus and ovaries more clearly. Color and duplex Doppler ultrasound was utilized to evaluate blood flow to the ovaries. COMPARISON:  04/27/2017.  03/03/2017.  12/18/2013. FINDINGS: Uterus Measurements: 8.2 x 5.1 x 5.8 cm. Multiple small leiomyomas. Largest measures 2 cm near the fundus. Endometrium Thickness: 11 mm.  No focal abnormality visualized. Right ovary Measurements: 4.0 x 3.8 x 3.8 cm. Two adjacent cysts, measuring to gather 3.9 x 2.7 x 2.5 cm. These could be functional cysts, endometriomas or tubo-ovarian abscess. Left ovary Measurements: 2.6 x 1.8 x 1.7 cm. Normal appearance/no adnexal mass. Pulsed Doppler evaluation of both ovaries demonstrates normal low-resistance arterial and venous waveforms. Other findings No abnormal free fluid. IMPRESSION: Several small leiomyomas of the uterus. Two adjacent cystic areas associated with the right ovary measuring together 3.9 x 2.7 x 2.5 cm. These are nonspecific and could represent functional cysts, endometrioma or tubo-ovarian  abscess. Electronically Signed   By: Nelson Chimes M.D.   On: 10/16/2017 20:25   [place] Art/ven Flow Abd Pelv Doppler  Result Date: 10/16/2017 CLINICAL DATA:  Pelvic pain over the last week. EXAM: TRANSABDOMINAL AND TRANSVAGINAL ULTRASOUND OF PELVIS DOPPLER ULTRASOUND OF OVARIES TECHNIQUE: Both transabdominal and transvaginal ultrasound examinations of the pelvis were performed. Transabdominal technique was performed for global imaging of the pelvis including uterus, ovaries, adnexal regions, and pelvic cul-de-sac. It was necessary to proceed with endovaginal exam following the transabdominal exam to visualize the uterus and ovaries more clearly. Color and duplex Doppler ultrasound was utilized to evaluate blood flow to the ovaries. COMPARISON:  04/27/2017.  03/03/2017.  12/18/2013. FINDINGS:  Uterus Measurements: 8.2 x 5.1 x 5.8 cm. Multiple small leiomyomas. Largest measures 2 cm near the fundus. Endometrium Thickness: 11 mm.  No focal abnormality visualized. Right ovary Measurements: 4.0 x 3.8 x 3.8 cm. Two adjacent cysts, measuring to gather 3.9 x 2.7 x 2.5 cm. These could be functional cysts, endometriomas or tubo-ovarian abscess. Left ovary Measurements: 2.6 x 1.8 x 1.7 cm. Normal appearance/no adnexal mass. Pulsed Doppler evaluation of both ovaries demonstrates normal low-resistance arterial and venous waveforms. Other findings No abnormal free fluid. IMPRESSION: Several small leiomyomas of the uterus. Two adjacent cystic areas associated with the right ovary measuring together 3.9 x 2.7 x 2.5 cm. These are nonspecific and could represent functional cysts, endometrioma or tubo-ovarian abscess. Electronically Signed   By: Nelson Chimes M.D.   On: 10/16/2017 20:25    Procedures Procedures (including critical care time)  Medications Ordered in ED Medications  HYDROcodone-acetaminophen (NORCO/VICODIN) 5-325 MG per tablet 1 tablet (1 tablet Oral Given 10/16/17 1848)  cyclobenzaprine (FLEXERIL) tablet 10 mg  (10 mg Oral Given 10/16/17 2111)     Initial Impression / Assessment and Plan / ED Course  I have reviewed the triage vital signs and the nursing notes.  Pertinent labs & imaging results that were available during my care of the patient were reviewed by me and considered in my medical decision making (see chart for details).     Patient presents for evaluation of left-sided low back, left hip, and left lower extremity pain.  She is afebrile, hypertensive in the ED though she has a known history of hypertension.  Suspect some elevation in blood pressure and response to pain.  Low suspicion of dissection.  No red flag signs concerning for cauda equina or spinal abscess, no midline spine tenderness.  She is ambulatory despite pain.  She does state that the last time she had similar pain she had to undergo a D&C secondary to endometriosis.  She has mild left sided pelvic pain, no true abdominal pain.  Pelvic ultrasound shows several small leiomyomas of the uterus and 2 adjacent cysts of the right ovary.  She has no right-sided tenderness and I doubt PID, ovarian torsion, TOA, or ectopic pregnancy.  Doubt other acute surgical abdominal pathology.  Symptoms appear to be consistent with radiculopathy versus osteoarthritis flare.  Will discharge with steroid taper, Flexeril for muscle spasms.  Advised of appropriate use of these medications. She has an appointment scheduled with her PCP later this week.  I encouraged her to keep this appointment for follow-up of her symptoms and reevaluation of her hypertension.  Discussed strict ED return precautions. Pt verbalized understanding of and agreement with plan and is safe for discharge home at this time.   Final Clinical Impressions(s) / ED Diagnoses   Final diagnoses:  Acute left-sided low back pain with left-sided sciatica  Left hip pain    ED Discharge Orders         Ordered    predniSONE (STERAPRED UNI-PAK 21 TAB) 10 MG (21) TBPK tablet  Daily      10/16/17 2110    cyclobenzaprine (FLEXERIL) 10 MG tablet  2 times daily PRN     10/16/17 2110    acetaminophen (TYLENOL) 500 MG tablet  Every 6 hours PRN     10/16/17 2110           Renita Papa, PA-C 10/17/17 1451    Julianne Rice, MD 10/21/17 1711

## 2017-10-20 ENCOUNTER — Ambulatory Visit: Payer: Self-pay | Attending: Family Medicine | Admitting: Physician Assistant

## 2017-10-20 ENCOUNTER — Encounter

## 2017-10-20 VITALS — BP 137/82 | HR 90 | Temp 99.4°F | Resp 18 | Ht 65.0 in | Wt 214.0 lb

## 2017-10-20 DIAGNOSIS — Z09 Encounter for follow-up examination after completed treatment for conditions other than malignant neoplasm: Secondary | ICD-10-CM

## 2017-10-20 DIAGNOSIS — M5442 Lumbago with sciatica, left side: Secondary | ICD-10-CM | POA: Insufficient documentation

## 2017-10-20 DIAGNOSIS — D259 Leiomyoma of uterus, unspecified: Secondary | ICD-10-CM | POA: Insufficient documentation

## 2017-10-20 DIAGNOSIS — N83201 Unspecified ovarian cyst, right side: Secondary | ICD-10-CM | POA: Insufficient documentation

## 2017-10-20 DIAGNOSIS — Z79899 Other long term (current) drug therapy: Secondary | ICD-10-CM | POA: Insufficient documentation

## 2017-10-20 DIAGNOSIS — I10 Essential (primary) hypertension: Secondary | ICD-10-CM | POA: Insufficient documentation

## 2017-10-20 LAB — POCT URINE PREGNANCY: Preg Test, Ur: NEGATIVE

## 2017-10-20 MED ORDER — KETOROLAC TROMETHAMINE 60 MG/2ML IM SOLN
60.0000 mg | Freq: Once | INTRAMUSCULAR | Status: AC
  Administered 2017-10-20: 60 mg via INTRAMUSCULAR

## 2017-10-20 MED ORDER — TRAMADOL HCL 50 MG PO TABS: 50.0000 mg | ORAL_TABLET | Freq: Three times a day (TID) | ORAL | 0 refills | Status: DC | PRN

## 2017-10-20 MED ORDER — METHOCARBAMOL 500 MG PO TABS: 1000.0000 mg | ORAL_TABLET | Freq: Three times a day (TID) | ORAL | 0 refills | Status: DC

## 2017-10-20 MED FILL — METHOCARBAMOL 500 MG TABS: 500 | 15 days supply | Qty: 90 | Fill #0

## 2017-10-20 MED FILL — traMADol HCL 50 MG TABS: 50 | 10 days supply | Qty: 30 | Fill #0

## 2017-10-20 NOTE — Progress Notes (Signed)
Patient ID: Renee Collier, female   DOB: Jun 26, 1970, 46 y.o.   MRN: [id number]      Renee Collier, is a 47 y.o. female  GMW:[id number]  YQI:[id number]  DOB - 1970-10-24  Subjective:  Chief Complaint and HPI: Renee Collier is a 47 y.o. female here today for a follow up visit Seen in ED 10/16/2017 for LBP and pelvic pain.  uterine myelomas were found on pelvic u/s and 2 R ovarian cysts.  Continuing to have very bad L lower back pain and pain down and around the front of the leg.  Limps with walking.  Prednisone not helping yet.  Pain started initially after lifting and moving boxes at work for opening a new location.  No numbness/weakness.  No problems moving bowels/bladder.  Needs referral to gyn.  No urinary s/sx.   From A/P: Patient presents for evaluation of left-sided low back, left hip, and left lower extremity pain.  She is afebrile, hypertensive in the ED though she has a known history of hypertension.  Suspect some elevation in blood pressure and response to pain.  Low suspicion of dissection.  No red flag signs concerning for cauda equina or spinal abscess, no midline spine tenderness.  She is ambulatory despite pain.  She does state that the last time she had similar pain she had to undergo a D&C secondary to endometriosis.  She has mild left sided pelvic pain, no true abdominal pain.  Pelvic ultrasound shows several small leiomyomas of the uterus and 2 adjacent cysts of the right ovary.  She has no right-sided tenderness and I doubt PID, ovarian torsion, TOA, or ectopic pregnancy.  Doubt other acute surgical abdominal pathology.  Symptoms appear to be consistent with radiculopathy versus osteoarthritis flare.  Will discharge with steroid taper, Flexeril for muscle spasms.  Advised of appropriate use of these medications. She has an appointment scheduled with her PCP later this week.  I encouraged her to keep this appointment for follow-up of her symptoms and reevaluation of her hypertension.   Discussed strict ED return precautions. Pt verbalized understanding of and agreement with plan and is safe for discharge home at this time.   ED/Hospital notes reviewed.   Social History:  Works as a Estate manager/land agent.    ROS:   Constitutional:  No f/c, No night sweats, No unexplained weight loss. EENT:  No vision changes, No blurry vision, No hearing changes. No mouth, throat, or ear problems.  Respiratory: No cough, No SOB Cardiac: No CP, no palpitations GI:  No abd pain, No N/V/D. GU: No Urinary s/sx Musculoskeletal: +L lower back and L leg pain Neuro: No headache, no dizziness, no motor weakness.  Skin: No rash Endocrine:  No polydipsia. No polyuria.  Psych: Denies SI/HI  No problems updated.  ALLERGIES: No Known Allergies  PAST MEDICAL HISTORY: Past Medical History:  Diagnosis Date  . Hypertension     MEDICATIONS AT HOME: Prior to Admission medications   Medication Sig Start Date End Date Taking? Authorizing Provider  acetaminophen (TYLENOL) 500 MG tablet Take 1 tablet (500 mg total) by mouth every 6 (six) hours as needed. 10/16/17  Yes Fawze, Mina A, PA-C  butalbital-acetaminophen-caffeine (FIORICET, ESGIC) [id number] MG tablet Take 1 tablet by mouth every 8 (eight) hours as needed for headache. 06/20/17 06/20/18 Yes Charlott Rakes, MD  cyclobenzaprine (FLEXERIL) 10 MG tablet Take 1 tablet (10 mg total) by mouth 2 (two) times daily as needed for muscle spasms. 10/16/17  Yes Renita Papa, PA-C  Elagolix  Sodium (ORILISSA) 150 MG TABS Take 1 tablet by mouth 2 (two) times daily. 09/26/17  Yes Dove, Myra C, MD  hydrochlorothiazide (HYDRODIURIL) 25 MG tablet Take 1 tablet (25 mg total) by mouth daily. 06/20/17  Yes Charlott Rakes, MD  ibuprofen (ADVIL,MOTRIN) 600 MG tablet Take 1 tablet (600 mg total) by mouth every 6 (six) hours as needed. 04/27/17  Yes Dove, Myra C, MD  predniSONE (STERAPRED UNI-PAK 21 TAB) 10 MG (21) TBPK tablet Take by mouth daily. Take 6 tabs by  mouth daily  for 2 days, then 5 tabs for 2 days, then 4 tabs for 2 days, then 3 tabs for 2 days, 2 tabs for 2 days, then 1 tab by mouth daily for 2 days 10/16/17  Yes Fawze, Mina A, PA-C  methocarbamol (ROBAXIN) 500 MG tablet Take 2 tablets (1,000 mg total) by mouth 3 (three) times daily. X 7 days then prn pain 10/20/17   Argentina Donovan, PA-C  traMADol (ULTRAM) 50 MG tablet Take 1 tablet (50 mg total) by mouth every 8 (eight) hours as needed. 10/20/17   Argentina Donovan, PA-C     Objective:  EXAM:   Vitals:   10/20/17 1410  BP: 137/82  Pulse: 90  Resp: 18  Temp: 99.4 F (37.4 C)  TempSrc: Oral  SpO2: 99%  Weight: 214 lb (97.1 kg)  Height: 5\' 5"  (1.651 m)    General appearance : A&OX3. NAD. Non-toxic-appearing, unable to walk without limping/hobbling HEENT: Atraumatic and Normocephalic.  PERRLA. EOM intact.  Neck: supple, no JVD. No cervical lymphadenopathy. No thyromegaly Chest/Lungs:  Breathing-non-labored, Good air entry bilaterally, breath sounds normal without rales, rhonchi, or wheezing  CVS: S1 S2 regular, no murmurs, gallops, rubs  Back: ROM ~50% of normal.  No bony TTP.  +paraspinus spasm on L>R.  DTR=intact B.  Neg SLR.  Difficult to examine overall due to pain.   Extremities: Bilateral Lower Ext shows no edema, both legs are warm to touch with = pulse throughout Neurology:  CN II-XII grossly intact, Non focal.   Psych:  TP linear. J/I WNL. Normal speech. Appropriate eye contact and affect.  Skin:  No Rash  Data Review Lab Results  Component Value Date   HGBA1C 5.8 (H) 01/04/2017     Assessment & Plan   1. Acute left-sided low back pain with left-sided sciatica Finish prednisone, then start ibuprofen- - ketorolac (TORADOL) injection 60 mg - traMADol (ULTRAM) 50 MG tablet; Take 1 tablet (50 mg total) by mouth every 8 (eight) hours as needed.  Dispense: 30 tablet; Refill: 0 - methocarbamol (ROBAXIN) 500 MG tablet; Take 2 tablets (1,000 mg total) by mouth 3 (three)  times daily. X 7 days then prn pain  Dispense: 90 tablet; Refill: 0 - DG Lumbar Spine Complete; Future - POCT urine pregnancy  2. Uterine leiomyoma, unspecified location Refer gyn  3. Encounter for examination following treatment at hospital Not improving.      Patient have been counseled extensively about nutrition and exercise  Return for keep appt in October.  The patient was given clear instructions to go to ER or return to medical center if symptoms don't improve, worsen or new problems develop. The patient verbalized understanding. The patient was told to call to get lab results if they haven't heard anything in the next week.     [name], PA-C [facility] [place], [place]   10/20/2017, 2:27 PM

## 2017-11-10 ENCOUNTER — Ambulatory Visit
Admission: RE | Admit: 2017-11-10 | Discharge: 2017-11-10 | Disposition: A | Discharge status: Home / Self Care | Payer: No Typology Code available for payment source | Source: Ambulatory Visit | Attending: Obstetrics and Gynecology | Admitting: Obstetrics and Gynecology

## 2017-11-10 ENCOUNTER — Ambulatory Visit (HOSPITAL_COMMUNITY)
Admission: RE | Admit: 2017-11-10 | Discharge: 2017-11-10 | Disposition: A | Discharge status: Home / Self Care | Payer: Self-pay | Source: Ambulatory Visit | Attending: Obstetrics and Gynecology | Admitting: Obstetrics and Gynecology

## 2017-11-10 ENCOUNTER — Encounter (HOSPITAL_COMMUNITY): Payer: Self-pay

## 2017-11-10 VITALS — BP 146/100 | Wt 210.8 lb

## 2017-11-10 DIAGNOSIS — Z1239 Encounter for other screening for malignant neoplasm of breast: Secondary | ICD-10-CM

## 2017-11-10 DIAGNOSIS — Z1231 Encounter for screening mammogram for malignant neoplasm of breast: Secondary | ICD-10-CM

## 2017-11-10 NOTE — Progress Notes (Signed)
No complaints today.   Pap Smear: Pap smear not completed today. Last Pap smear was 08/16/2017 at [facility] and normal with positive HPV. Patient has a history of one abnormal Pap smear 03/13/2010 that was ASCUS with negative HPV. Patient had a repeat Pap smear in one year that was normal per patient. Last Pap smear and Pap smear from 03/13/2010 are in [place]  Physical exam: Breasts Breasts symmetrical. No skin abnormalities bilateral breasts. No nipple retraction bilateral breasts. No nipple discharge bilateral breasts. No lymphadenopathy. No lumps palpated bilateral breasts. No complaints of pain or tenderness on exam. Referred patient to the [facility] for a screening mammogram. Appointment scheduled for Thursday, November 10, 2017 at 1540.        Pelvic/Bimanual No Pap smear completed today since last Pap smear and HPV typing was 08/16/2017. Pap smear not indicated per BCCCP guidelines.   Smoking History: Patient has never smoked.  Patient Navigation: Patient education provided. Access to services provided for patient through BCCCP program. .  Breast and Cervical Cancer Risk Assessment: Patient has no family history of breast cancer, known genetic mutations, or radiation treatment to the chest before age 21. Patient has no history of cervical dysplasia, immunocompromised, or DES exposure in-utero.  Risk Assessment    Risk Scores      11/10/2017   Last edited by: Armond Hang, LPN   5-year risk: 0.8 %   Lifetime risk: 9.4 %

## 2017-11-10 NOTE — Patient Instructions (Signed)
Explained breast self awareness with Renee Collier. Patient did not need a Pap smear today due to last Pap smear was 08/16/2017. Let patient know that her next Pap smear is due in one year since her last Pap smear was HPV positive. Referred patient to the [facility] for a screening mammogram. Appointment scheduled for Thursday, November 10, 2017 at 1540. Let patient know the [facility] will follow up with her within the next couple weeks with results of mammogram by letter or phone. Renee Collier verbalized understanding.  Renee Collier, Arvil Chaco, RN 3:04 PM

## 2017-11-16 ENCOUNTER — Ambulatory Visit: Payer: Self-pay | Admitting: Family Medicine

## 2017-11-23 ENCOUNTER — Ambulatory Visit: Payer: Self-pay | Admitting: Family Medicine

## 2017-11-30 ENCOUNTER — Ambulatory Visit (INDEPENDENT_AMBULATORY_CARE_PROVIDER_SITE_OTHER): Payer: Self-pay | Admitting: Family Medicine

## 2017-11-30 ENCOUNTER — Encounter (HOSPITAL_COMMUNITY): Payer: Self-pay | Admitting: *Deleted

## 2017-11-30 ENCOUNTER — Encounter: Payer: Self-pay | Admitting: Family Medicine

## 2017-11-30 VITALS — BP 138/96 | HR 85 | Ht 64.0 in | Wt 211.7 lb

## 2017-11-30 DIAGNOSIS — N809 Endometriosis, unspecified: Secondary | ICD-10-CM

## 2017-11-30 DIAGNOSIS — G8929 Other chronic pain: Secondary | ICD-10-CM

## 2017-11-30 DIAGNOSIS — R102 Pelvic and perineal pain: Secondary | ICD-10-CM

## 2017-11-30 NOTE — Patient Instructions (Addendum)
[email or web address]

## 2017-11-30 NOTE — Progress Notes (Signed)
   GYNECOLOGY OFFICE VISIT NOTE History:  47 y.o. [id number] here today for follow-up of chronic pelvic pain. She denies any abnormal vaginal discharge, bleeding, pelvic pain or other concerns.   - pain 2-3 days out of week, tries to move and gets better - still numbness/tingling left leg - walking back/forth to work again - not taking Edward [employer]ia -  too expensive - periods were heavy but D&C has made it better, lasts about a week, usually heavy bleeding 3-4 days, irregular periods - pain in left side and back with headaches - wondering about getting coverage for Orlissa from [employer] of money - needs letter  - no lightheadedness, dizziness  - no urinary symptoms - denies constipation   The following portions of the patient's history were reviewed and updated as appropriate: allergies, current medications, past family history, past medical history, past social history, past surgical history and problem list.   Review of Systems:  Pertinent items noted in HPI ROS  Objective:  Physical Exam BP (!) 138/96   Pulse 85   Ht 5\' 4"  (1.626 m)   Wt 96 kg   BMI 36.34 kg/m  Physical Exam  Constitutional: She is oriented to person, place, and time. She appears well-developed and well-nourished. No distress.  HENT:  Head: Normocephalic and atraumatic.  Eyes: Conjunctivae and EOM are normal.  Cardiovascular: Normal rate.  Pulmonary/Chest: No respiratory distress.  Abdominal: Soft. There is no tenderness. There is no guarding.  Genitourinary:  Genitourinary Comments: deferred  Neurological: She is alert and oriented to person, place, and time.  Psychiatric: She has a normal mood and affect. Her behavior is normal.  Nursing note and vitals reviewed.  Assessment & Plan:  47 y.o. [id number] here today for follow-up of chronic pelvic pain. Was seeing Dr. Hulan Fray regularly for these symptoms and had D&C and endometrial ablation about 6 months ago. Presumed to be related to endometriosis but unable to  get Edward [employer]ia for cost. Letter provided today to present to employer and given information for Edward [employer] to apply for financial assistance. Recommended trial of Orlissa and follow-up prn.   Lambert Mody. Juleen China, DO OB Family Medicine Fellow, [facility] for [employer], [place]

## 2017-12-06 ENCOUNTER — Encounter: Payer: Self-pay | Admitting: Family Medicine

## 2018-01-04 ENCOUNTER — Other Ambulatory Visit: Payer: Self-pay | Admitting: Physician Assistant

## 2018-01-04 DIAGNOSIS — M5442 Lumbago with sciatica, left side: Secondary | ICD-10-CM

## 2018-01-06 MED ORDER — METHOCARBAMOL 500 MG PO TABS: 1000.0000 mg | ORAL_TABLET | Freq: Three times a day (TID) | ORAL | 0 refills | Status: DC

## 2018-01-06 MED ORDER — TRAMADOL HCL 50 MG PO TABS: 50.0000 mg | ORAL_TABLET | Freq: Three times a day (TID) | ORAL | 0 refills | Status: DC | PRN

## 2018-01-06 NOTE — Telephone Encounter (Signed)
Refill request

## 2018-01-10 ENCOUNTER — Telehealth: Payer: Self-pay | Admitting: Family Medicine

## 2018-01-10 NOTE — Telephone Encounter (Signed)
Attempted to called patient to inform her of her traMADol refill being ready at the front desk but her phone says its not in service

## 2018-01-16 ENCOUNTER — Encounter: Payer: Self-pay | Admitting: General Practice

## 2018-01-16 NOTE — Progress Notes (Signed)
Patient came by office and dropped off assistance application for Lupron. Application faxed off.

## 2018-01-28 ENCOUNTER — Encounter (HOSPITAL_COMMUNITY): Payer: Self-pay | Admitting: Emergency Medicine

## 2018-01-28 ENCOUNTER — Other Ambulatory Visit: Payer: Self-pay

## 2018-01-28 ENCOUNTER — Emergency Department (HOSPITAL_COMMUNITY)
Admission: EM | Admit: 2018-01-28 | Discharge: 2018-01-28 | Disposition: A | Discharge status: Home / Self Care | Payer: Self-pay | Attending: Emergency Medicine | Admitting: Emergency Medicine

## 2018-01-28 DIAGNOSIS — Z79899 Other long term (current) drug therapy: Secondary | ICD-10-CM | POA: Insufficient documentation

## 2018-01-28 DIAGNOSIS — M20002 Unspecified deformity of left finger(s): Secondary | ICD-10-CM | POA: Insufficient documentation

## 2018-01-28 DIAGNOSIS — M20012 Mallet finger of left finger(s): Secondary | ICD-10-CM

## 2018-01-28 DIAGNOSIS — I10 Essential (primary) hypertension: Secondary | ICD-10-CM | POA: Insufficient documentation

## 2018-01-28 DIAGNOSIS — J101 Influenza due to other identified influenza virus with other respiratory manifestations: Secondary | ICD-10-CM | POA: Insufficient documentation

## 2018-01-28 DIAGNOSIS — R6889 Other general symptoms and signs: Secondary | ICD-10-CM

## 2018-01-28 MED ORDER — OSELTAMIVIR PHOSPHATE 75 MG PO CAPS: 75.0000 mg | ORAL_CAPSULE | Freq: Two times a day (BID) | ORAL | 0 refills | Status: DC

## 2018-01-28 NOTE — ED Triage Notes (Signed)
Pt states since Thursday she has had flu like sx. Head and throat pain. Pt state's she feels like shes been hit by a bus. Pt taking otc cold and flu medication without relief

## 2018-01-28 NOTE — Discharge Instructions (Signed)
Your symptoms likely related to the flu.  Take Tamiflu as prescribed.  He also injured your left index finger, this is likely an extensor tendon injury.  Wear finger splint for the next 8 weeks and follow-up with hand specialist for further care.

## 2018-01-28 NOTE — ED Notes (Signed)
Patient verbalizes understanding of discharge instructions. Opportunity for questioning and answers were provided. Armband removed by staff, pt discharged from ED ambulatory.   

## 2018-01-28 NOTE — ED Provider Notes (Signed)
[facility] Provider Note   CSN: [id number] Arrival date & time: 01/28/18  1706     History   Chief Complaint Chief Complaint  Patient presents with  . flu like sx    HPI Renee Collier is a 47 y.o. female.  The history is provided by the patient. No language interpreter was used.     47 year old female presents with flulike symptoms.  For the past 2 days patient has had subjective fever, chills, headache, sinus congestion, sore throat, body aches, nonproductive cough as well as nausea and vomiting.  She admits to recent sick contact with her granddaughter with the flu.  She is tried over-the-counter medication without adequate relief.  Patient states "I feel like I got hit by a truck".  Furthermore, patient reports she fell and landed on her left index finger a month ago.  Since then, she is unable to fully extend the tip of her finger.  Pain is minimal at this time but she is unable to fully extend her finger.  She is right-hand dominant.  She denies any associated numbness.  Past Medical History:  Diagnosis Date  . Hypertension     Patient Active Problem List   Diagnosis Date Noted  . Status post HTA endometrial ablation 04/10/2014  . Abnormal uterine bleeding (AUB) 12/18/2013    Past Surgical History:  Procedure Laterality Date  . DILATION AND CURETTAGE OF UTERUS N/A 04/27/2017   Procedure: Ultrasound Guided Dilatation and Curettage;  Surgeon: [name], MD;  Location: [facility];  Service: Gynecology;  Laterality: N/A;  . ENDOMETRIAL ABLATION    . HYSTEROSCOPY N/A 03/12/2014   Procedure: HYSTEROSCOPY WITH HYDROTHERMAL ABLATION;  Surgeon: Osborne Oman, MD;  Location: [facility];  Service: Gynecology;  Laterality: N/A;  . TUBAL LIGATION       OB History    Gravida  3   Para  3   Term  3   Preterm      AB      Living  3     SAB      TAB      Ectopic      Multiple      Live Births  3            Home Medications    Prior to Admission medications   Medication Sig Start Date End Date Taking? Authorizing Provider  butalbital-acetaminophen-caffeine (FIORICET, ESGIC) 50-325-40 MG tablet Take 1 tablet by mouth every 8 (eight) hours as needed for headache. 06/20/17 06/20/18  Charlott Rakes, MD  hydrochlorothiazide (HYDRODIURIL) 25 MG tablet Take 1 tablet (25 mg total) by mouth daily. 06/20/17   Charlott Rakes, MD  ibuprofen (ADVIL,MOTRIN) 600 MG tablet Take 1 tablet (600 mg total) by mouth every 6 (six) hours as needed. 04/27/17   [name], MD  methocarbamol (ROBAXIN) 500 MG tablet Take 2 tablets (1,000 mg total) by mouth 3 (three) times daily. X 7 days then prn pain 01/06/18   Charlott Rakes, MD  traMADol (ULTRAM) 50 MG tablet Take 1 tablet (50 mg total) by mouth every 8 (eight) hours as needed. 01/06/18   Charlott Rakes, MD    Family History Family History  Problem Relation Age of Onset  . Cancer Mother        cervical    Social History Social History   Tobacco Use  . Smoking status: Never Smoker  . Smokeless tobacco: Never Used  Substance Use Topics  .  Alcohol use: No  . Drug use: No     Allergies   Patient has no known allergies.   Review of Systems Review of Systems  All other systems reviewed and are negative.    Physical Exam Updated Vital Signs BP (!) 147/95 (BP Location: Right Arm)   Pulse 79   Temp 98.4 F (36.9 C) (Oral)   Resp 16   Ht 5\' 4"  (1.626 m)   Wt 94.8 kg   SpO2 99%   BMI 35.87 kg/m   Physical Exam Vitals signs and nursing note reviewed.  Constitutional:      General: She is not in acute distress.    Appearance: She is well-developed.  HENT:     Head: Atraumatic.     Comments: Ears: TMs normal bilaterally Nose: Normal nares Throat: Uvula midline no trauma no tonsil enlargement no exudates. Eyes:     Conjunctiva/sclera: Conjunctivae normal.  Neck:     Musculoskeletal: Neck supple. No neck rigidity.  Cardiovascular:      Rate and Rhythm: Normal rate and regular rhythm.     Pulses: Normal pulses.     Heart sounds: Normal heart sounds.  Pulmonary:     Effort: Pulmonary effort is normal.     Breath sounds: No wheezing, rhonchi or rales.  Abdominal:     Palpations: Abdomen is soft.     Tenderness: There is no abdominal tenderness.  Musculoskeletal:        General: Deformity (Left index finger: Finger to flex at the DIP joint unable to fully extend it is minimally tender to palpation.  Brisk cap refill, no nail involvement.) present.  Skin:    Findings: No rash.  Neurological:     Mental Status: She is alert and oriented to person, place, and time.      ED Treatments / Results  Labs (all labs ordered are listed, but only abnormal results are displayed) Labs Reviewed - No data to display  EKG None  Radiology No results found.  Procedures Procedures (including critical care time)  Medications Ordered in ED Medications - No data to display   Initial Impression / Assessment and Plan / ED Course  I have reviewed the triage vital signs and the nursing notes.  Pertinent labs & imaging results that were available during my care of the patient were reviewed by me and considered in my medical decision making (see chart for details).     BP (!) 147/95 (BP Location: Right Arm)   Pulse 79   Temp 98.4 F (36.9 C) (Oral)   Resp 16   Ht 5\' 4"  (1.626 m)   Wt 94.8 kg   SpO2 99%   BMI 35.87 kg/m    Final Clinical Impressions(s) / ED Diagnoses   Final diagnoses:  Flu-like symptoms  Mallet deformity of left index finger    ED Discharge Orders         Ordered    oseltamivir (TAMIFLU) 75 MG capsule  Every 12 hours     01/28/18 1810         6:07 PM Patient here with flulike symptoms.  She is within the window of treatment therefore she will be treated with Tamiflu.  Encourage rest, hydration, taking Tylenol or ibuprofen as needed for pain.  Work note provided.  She also has a left index  finger injury several month prior.  It appears that she may have injured her extensor tendon causing a permanent flexion at the DIP joint.  Will place  finger in a finger splint encourage patient to wear it for 8 weeks and she can follow-up with hand specialist for further care.   Domenic Moras, PA-C 01/28/18 1811    Blanchie Dessert, MD 01/29/18 1309

## 2018-01-30 MED FILL — ?OSELTAMIVIR PHOS 75 MG CAP: 75 | 5 days supply | Qty: 10 | Fill #0

## 2018-02-08 ENCOUNTER — Encounter: Payer: Self-pay | Admitting: Physician Assistant

## 2018-02-15 ENCOUNTER — Ambulatory Visit: Payer: Self-pay

## 2018-02-21 ENCOUNTER — Ambulatory Visit (INDEPENDENT_AMBULATORY_CARE_PROVIDER_SITE_OTHER): Payer: Self-pay | Admitting: *Deleted

## 2018-02-21 ENCOUNTER — Telehealth: Payer: Self-pay | Admitting: Student

## 2018-02-21 VITALS — BP 139/88 | HR 96

## 2018-02-21 DIAGNOSIS — N809 Endometriosis, unspecified: Secondary | ICD-10-CM

## 2018-02-21 DIAGNOSIS — G8929 Other chronic pain: Secondary | ICD-10-CM

## 2018-02-21 DIAGNOSIS — R102 Pelvic and perineal pain: Secondary | ICD-10-CM

## 2018-02-21 MED ORDER — LEUPROLIDE ACETATE 3.75 MG IM KIT
11.2500 mg | PACK | Freq: Once | INTRAMUSCULAR | Status: AC
  Administered 2018-02-21: 11.25 mg via INTRAMUSCULAR

## 2018-02-21 NOTE — Progress Notes (Signed)
Renee Collier here for Lupron  Injection.  Injection administered without complication. Patient will return in 3 months for next injection. Received free Lupron through patient assistance program.   Linda,RN 02/21/2018  4:43 PM

## 2018-02-21 NOTE — Telephone Encounter (Signed)
Attempted to contact the patient via the contact number in Epic. Left a message informing to call our clinic.

## 2018-02-21 NOTE — Patient Instructions (Signed)
Leuprolide depot injection What is this medicine? LEUPROLIDE (loo PROE lide) is a man-made protein that acts like a natural hormone in the body. It decreases testosterone in men and decreases estrogen in women. In men, this medicine is used to treat advanced prostate cancer. In women, some forms of this medicine may be used to treat endometriosis, uterine fibroids, or other female hormone-related problems. This medicine may be used for other purposes; ask your health care provider or pharmacist if you have questions. COMMON BRAND NAME(S): Eligard, Lupron Depot, Lupron Depot-Ped, Viadur What should I tell my health care provider before I take this medicine? They need to know if you have any of these conditions: -diabetes -heart disease or previous heart attack -high blood pressure -high cholesterol -mental illness -osteoporosis -pain or difficulty passing urine -seizures -spinal cord metastasis -stroke -suicidal thoughts, plans, or attempt; a previous suicide attempt by you or a family member -tobacco smoker -unusual vaginal bleeding (women) -an unusual or allergic reaction to leuprolide, benzyl alcohol, other medicines, foods, dyes, or preservatives -pregnant or trying to get pregnant -breast-feeding How should I use this medicine? This medicine is for injection into a muscle or for injection under the skin. It is given by a health care professional in a hospital or clinic setting. The specific product will determine how it will be given to you. Make sure you understand which product you receive and how often you will receive it. Talk to your pediatrician regarding the use of this medicine in children. Special care may be needed. Overdosage: If you think you have taken too much of this medicine contact a poison control center or emergency room at once. NOTE: This medicine is only for you. Do not share this medicine with others. What if I miss a dose? It is important not to miss a dose.  Call your doctor or health care professional if you are unable to keep an appointment. Depot injections: Depot injections are given either once-monthly, every 12 weeks, every 16 weeks, or every 24 weeks depending on the product you are prescribed. The product you are prescribed will be based on if you are female or female, and your condition. Make sure you understand your product and dosing. What may interact with this medicine? Do not take this medicine with any of the following medications: -chasteberry This medicine may also interact with the following medications: -herbal or dietary supplements, like black cohosh or DHEA -female hormones, like estrogens or progestins and birth control pills, patches, rings, or injections -female hormones, like testosterone This list may not describe all possible interactions. Give your health care provider a list of all the medicines, herbs, non-prescription drugs, or dietary supplements you use. Also tell them if you smoke, drink alcohol, or use illegal drugs. Some items may interact with your medicine. What should I watch for while using this medicine? Visit your doctor or health care professional for regular checks on your progress. During the first weeks of treatment, your symptoms may get worse, but then will improve as you continue your treatment. You may get hot flashes, increased bone pain, increased difficulty passing urine, or an aggravation of nerve symptoms. Discuss these effects with your doctor or health care professional, some of them may improve with continued use of this medicine. Female patients may experience a menstrual cycle or spotting during the first months of therapy with this medicine. If this continues, contact your doctor or health care professional. What side effects may I notice from receiving this medicine? Side  medicine.  Female patients may experience a menstrual cycle or spotting during the first months of therapy with this medicine. If this continues, contact your doctor or health care professional.  What side effects may I notice from receiving this medicine?  Side effects that you should report to your doctor or health care professional as soon as possible:  -allergic reactions like skin  rash, itching or hives, swelling of the face, lips, or tongue  -breathing problems  -chest pain  -depression or memory disorders  -pain in your legs or groin  -pain at site where injected or implanted  -seizures  -severe headache  -swelling of the feet and legs  -suicidal thoughts or other mood changes  -visual changes  -vomiting  Side effects that usually do not require medical attention (report to your doctor or health care professional if they continue or are bothersome):  -breast swelling or tenderness  -decrease in sex drive or performance  -diarrhea  -hot flashes  -loss of appetite  -muscle, joint, or bone pains  -nausea  -redness or irritation at site where injected or implanted  -skin problems or acne  This list may not describe all possible side effects. Call your doctor for medical advice about side effects. You may report side effects to FDA at 1-800-FDA-1088.  Where should I keep my medicine?  This drug is given in a hospital or clinic and will not be stored at home.  NOTE: This sheet is a summary. It may not cover all possible information. If you have questions about this medicine, talk to your doctor, pharmacist, or health care provider.   2019 Elsevier/Gold Standard (2015-07-03 09:45:53)

## 2018-02-22 NOTE — Progress Notes (Signed)
Agree with RN documentation and plan of care.   Lambert Mody. Juleen China, DO OB/GYN Fellow

## 2018-02-27 ENCOUNTER — Telehealth: Payer: Self-pay | Admitting: *Deleted

## 2018-02-27 NOTE — Telephone Encounter (Signed)
Registrars have made appointment for Renee Collier only and will make appointment for Renee Collier when the schedule for April  is available

## 2018-02-27 NOTE — Telephone Encounter (Signed)
-----   Message from [name], MD sent at 02/22/2018  8:16 AM EST ----- Regarding: RE: Lupron She can see me and get another 11.25 mg shot in 3 months. Thanks ----- Message ----- From: Samuel Germany, RN Sent: 02/21/2018   5:01 PM EST To: [name], MD Subject: Lupron                                         Dr.Dove, How many doses of Lupron do you want Ms. Douthitt to get? She got first one today of 11.25 mg.  When does she need to see you again> Revella Shelton,RN

## 2018-03-15 ENCOUNTER — Ambulatory Visit: Payer: Self-pay

## 2018-03-22 MED FILL — HYDROCHLOROTHIAZIDE 25 MG T: 25 | 30 days supply | Qty: 30 | Fill #2

## 2018-03-29 ENCOUNTER — Ambulatory Visit: Payer: Self-pay

## 2018-04-04 NOTE — Progress Notes (Unsigned)
Camilla for pt's refill on Depo Lupron 11.25 mg injection @ [phone] and spoke with Judeen Hammans who informed me that the pt's Depo Lupron will be shipped today.  Judeen Hammans also informed me that the pt's enrollment period ends in 02/2019.  Pt has an appt scheduled already for injection with provider visit on 05/24/18 that pt is already aware of.

## 2018-04-08 ENCOUNTER — Encounter (HOSPITAL_COMMUNITY): Payer: Self-pay | Admitting: Family Medicine

## 2018-04-08 ENCOUNTER — Other Ambulatory Visit: Payer: Self-pay

## 2018-04-08 ENCOUNTER — Ambulatory Visit (HOSPITAL_COMMUNITY)
Admission: EM | Admit: 2018-04-08 | Discharge: 2018-04-08 | Disposition: A | Discharge status: Home / Self Care | Payer: Self-pay | Attending: Family Medicine | Admitting: Family Medicine

## 2018-04-08 DIAGNOSIS — R6889 Other general symptoms and signs: Secondary | ICD-10-CM

## 2018-04-08 LAB — POCT RAPID STREP A: Streptococcus, Group A Screen (Direct): NEGATIVE

## 2018-04-08 MED ORDER — OSELTAMIVIR PHOSPHATE 75 MG PO CAPS: 75.0000 mg | ORAL_CAPSULE | Freq: Two times a day (BID) | ORAL | 0 refills | Status: DC

## 2018-04-08 MED ORDER — IBUPROFEN 600 MG PO TABS: 600.0000 mg | ORAL_TABLET | Freq: Four times a day (QID) | ORAL | 0 refills | Status: DC | PRN

## 2018-04-08 NOTE — ED Triage Notes (Signed)
Woke Thursday with a sore throat, coghing and turning all night.  Facial pain, stuffiness and congestion.  Complains of coughing up congestion from chest.  Unknown fever, no cold chills

## 2018-04-08 NOTE — ED Provider Notes (Signed)
[facility]    CSN: [id number] Arrival date & time: 04/08/18  1313     History   Chief Complaint Chief Complaint  Patient presents with  . URI    HPI Renee Collier is a 48 y.o. female.   Is a 48 year old woman who comes in complaining of sore throat, cough, night sweats, myalgia, and PND.  These symptoms commenced Thursday night  Patient works in [employer].     Past Medical History:  Diagnosis Date  . Hypertension     Patient Active Problem List   Diagnosis Date Noted  . Status post HTA endometrial ablation 04/10/2014  . Abnormal uterine bleeding (AUB) 12/18/2013    Past Surgical History:  Procedure Laterality Date  . DILATION AND CURETTAGE OF UTERUS N/A 04/27/2017   Procedure: Ultrasound Guided Dilatation and Curettage;  Surgeon: [name], MD;  Location: [facility];  Service: Gynecology;  Laterality: N/A;  . ENDOMETRIAL ABLATION    . HYSTEROSCOPY N/A 03/12/2014   Procedure: HYSTEROSCOPY WITH HYDROTHERMAL ABLATION;  Surgeon: Osborne Oman, MD;  Location: [facility];  Service: Gynecology;  Laterality: N/A;  . TUBAL LIGATION      OB History    Gravida  3   Para  3   Term  3   Preterm      AB      Living  3     SAB      TAB      Ectopic      Multiple      Live Births  3            Home Medications    Prior to Admission medications   Medication Sig Start Date End Date Taking? Authorizing Provider  hydrochlorothiazide (HYDRODIURIL) 25 MG tablet Take 1 tablet (25 mg total) by mouth daily. 06/20/17  Yes Charlott Rakes, MD  butalbital-acetaminophen-caffeine (FIORICET, ESGIC) 50-325-40 MG tablet Take 1 tablet by mouth every 8 (eight) hours as needed for headache. 06/20/17 06/20/18  Charlott Rakes, MD  ibuprofen (ADVIL,MOTRIN) 600 MG tablet Take 1 tablet (600 mg total) by mouth every 6 (six) hours as needed. 04/08/18   Robyn Haber, MD  oseltamivir (TAMIFLU) 75 MG capsule Take 1 capsule (75 mg total) by mouth every 12  (twelve) hours. 04/08/18   Robyn Haber, MD    Family History Family History  Problem Relation Age of Onset  . Cancer Mother        cervical    Social History Social History   Tobacco Use  . Smoking status: Never Smoker  . Smokeless tobacco: Never Used  Substance Use Topics  . Alcohol use: No  . Drug use: No     Allergies   Patient has no known allergies.   Review of Systems Review of Systems  HENT: Positive for sore throat.      Physical Exam Triage Vital Signs ED Triage Vitals  Enc Vitals Group     BP      Pulse      Resp      Temp      Temp src      SpO2      Weight      Height      Head Circumference      Peak Flow      Pain Score      Pain Loc      Pain Edu?      Excl. in [place]?    No  data found.  Updated Vital Signs BP (!) 153/95 (BP Location: Left Arm) Comment (BP Location): large cuff  Pulse 93   Temp 98.4 F (36.9 C) (Oral)   Resp (!) 22   SpO2 97%    Physical Exam Vitals signs and nursing note reviewed.  Constitutional:      Appearance: Normal appearance.  HENT:     Head: Normocephalic and atraumatic.     Right Ear: Tympanic membrane and ear canal normal.     Left Ear: Tympanic membrane and ear canal normal.     Nose: Congestion present.     Mouth/Throat:     Mouth: Mucous membranes are moist.     Pharynx: Oropharynx is clear. Posterior oropharyngeal erythema present. No oropharyngeal exudate.  Eyes:     Conjunctiva/sclera: Conjunctivae normal.  Neck:     Musculoskeletal: Normal range of motion and neck supple.  Cardiovascular:     Rate and Rhythm: Normal rate and regular rhythm.     Heart sounds: Normal heart sounds.  Pulmonary:     Effort: Pulmonary effort is normal.     Breath sounds: Normal breath sounds.  Musculoskeletal: Normal range of motion.  Skin:    General: Skin is warm and dry.  Neurological:     General: No focal deficit present.     Mental Status: She is alert.  Psychiatric:        Mood and Affect: Mood  normal.        Behavior: Behavior normal.      UC Treatments / Results  Labs (all labs ordered are listed, but only abnormal results are displayed) Labs Reviewed  POCT RAPID STREP A    EKG None  Radiology No results found.  Procedures Procedures (including critical care time)  Medications Ordered in UC Medications - No data to display  Initial Impression / Assessment and Plan / UC Course  I have reviewed the triage vital signs and the nursing notes.  Pertinent labs & imaging results that were available during my care of the patient were reviewed by me and considered in my medical decision making (see chart for details).    Final Clinical Impressions(s) / UC Diagnoses   Final diagnoses:  Flu-like symptoms   Discharge Instructions   None    ED Prescriptions    Medication Sig Dispense Auth. Provider   oseltamivir (TAMIFLU) 75 MG capsule Take 1 capsule (75 mg total) by mouth every 12 (twelve) hours. 10 capsule Robyn Haber, MD   ibuprofen (ADVIL,MOTRIN) 600 MG tablet Take 1 tablet (600 mg total) by mouth every 6 (six) hours as needed. 30 tablet Robyn Haber, MD     Controlled Substance Prescriptions [place] Controlled Substance Registry consulted? Not Applicable   Robyn Haber, MD 04/08/18 [phone]

## 2018-05-23 ENCOUNTER — Ambulatory Visit: Payer: Self-pay | Admitting: Obstetrics & Gynecology

## 2018-05-23 ENCOUNTER — Ambulatory Visit: Payer: Self-pay

## 2018-05-24 ENCOUNTER — Other Ambulatory Visit: Payer: Self-pay

## 2018-05-24 ENCOUNTER — Ambulatory Visit (INDEPENDENT_AMBULATORY_CARE_PROVIDER_SITE_OTHER): Payer: Self-pay | Admitting: Obstetrics & Gynecology

## 2018-05-24 DIAGNOSIS — R102 Pelvic and perineal pain: Secondary | ICD-10-CM

## 2018-05-24 DIAGNOSIS — Z0000 Encounter for general adult medical examination without abnormal findings: Secondary | ICD-10-CM

## 2018-05-24 DIAGNOSIS — G8929 Other chronic pain: Secondary | ICD-10-CM

## 2018-05-24 NOTE — Progress Notes (Signed)
   TELEHEALTH VIRTUAL GYNECOLOGY VISIT ENCOUNTER NOTE  I connected with Renee Collier on 05/24/18 at  9:35 AM EDT by WebEx at home and verified that I am speaking with the correct person using two identifiers.   I discussed the limitations, risks, security and privacy concerns of performing an evaluation and management service by telephone and the availability of in person appointments. I also discussed with the patient that there may be a patient responsible charge related to this service. The patient expressed understanding and agreed to proceed.   History:  Renee Collier is a 48 y.o. [id number] female being evaluated today for discussion about her depo Lupron which she got 1/20. Her next shot is due next week, she has an appt. She has not had a period since last month but she is still having pain when it is "time for my period", lasts for about a week.      Past Medical History:  Diagnosis Date  . Hypertension    Past Surgical History:  Procedure Laterality Date  . DILATION AND CURETTAGE OF UTERUS N/A 04/27/2017   Procedure: Ultrasound Guided Dilatation and Curettage;  Surgeon: [name], MD;  Location: [facility];  Service: Gynecology;  Laterality: N/A;  . ENDOMETRIAL ABLATION    . HYSTEROSCOPY N/A 03/12/2014   Procedure: HYSTEROSCOPY WITH HYDROTHERMAL ABLATION;  Surgeon: Osborne Oman, MD;  Location: [facility];  Service: Gynecology;  Laterality: N/A;  . TUBAL LIGATION     The following portions of the patient's history were reviewed and updated as appropriate: allergies, current medications, past family history, past medical history, past social history, past surgical history and problem list.   Health Maintenance:  Normal pap and positive HRHPV on 08/16/17. She reports that her mammogram is due again in July.  Review of Systems:  Pertinent items noted in HPI and remainder of comprehensive ROS otherwise negative. She works at [employer] and [employer]. Physical Exam:    General:  Alert, oriented and cooperative.   Mental Status: Normal mood and affect perceived. Normal judgment and thought content.  Physical exam deferred due to nature of the encounter  Labs and Imaging No results found for this or any previous visit (from the past 336 hour(s)). No results found.    Assessment and Plan:     1. Sister with ovarian cancer, with "genetic" cause. - she will get Invitae at her next visit - If + then she needs TVH/BSO.  2. Pelvic pain- improved with depo Lupron. - she will get one more shot     3. Mammogram ordered for 8/20  I discussed the assessment and treatment plan with the patient. The patient was provided an opportunity to ask questions and all were answered. The patient agreed with the plan and demonstrated an understanding of the instructions.   The patient was advised to call back or seek an in-person evaluation/go to the ED if the symptoms worsen or if the condition fails to improve as anticipated.  I provided 10 minutes of non-face-to-face time during this encounter.   [name], MD [facility], [place]

## 2018-05-29 ENCOUNTER — Other Ambulatory Visit: Payer: Self-pay

## 2018-05-29 ENCOUNTER — Ambulatory Visit (INDEPENDENT_AMBULATORY_CARE_PROVIDER_SITE_OTHER): Payer: Self-pay

## 2018-05-29 DIAGNOSIS — Z79818 Long term (current) use of other agents affecting estrogen receptors and estrogen levels: Secondary | ICD-10-CM

## 2018-05-29 MED ORDER — LEUPROLIDE ACETATE (3 MONTH) 11.25 MG IM KIT
11.2500 mg | PACK | Freq: Once | INTRAMUSCULAR | Status: AC
  Administered 2018-05-29: 16:00:00 11.25 mg via INTRAMUSCULAR

## 2018-05-29 NOTE — Progress Notes (Addendum)
Renee Collier here for free Depo Lupron 11.25 mg injection.  Injection administered without complication.  Per Dr. Hulan Fray note this is the pt's last injection.  Informed pt that she should make an appt with Dr. Hulan Fray so that she can know what her f/u.  Pt verbalized understanding with no further questions.   Verdell Carmine, RN 05/29/2018  3:54 PM

## 2018-05-29 NOTE — Progress Notes (Signed)
I have reviewed the chart and agree with nursing staff's documentation of this patient's encounter.  Marcille Buffy DNP, CNM  05/29/18  4:45 PM

## 2018-06-01 ENCOUNTER — Encounter

## 2018-08-23 ENCOUNTER — Ambulatory Visit: Payer: Self-pay

## 2018-09-20 ENCOUNTER — Other Ambulatory Visit: Payer: Self-pay | Admitting: Family Medicine

## 2018-09-20 DIAGNOSIS — I10 Essential (primary) hypertension: Secondary | ICD-10-CM

## 2018-09-29 ENCOUNTER — Other Ambulatory Visit (HOSPITAL_COMMUNITY): Payer: Self-pay | Admitting: *Deleted

## 2018-09-29 DIAGNOSIS — Z1231 Encounter for screening mammogram for malignant neoplasm of breast: Secondary | ICD-10-CM

## 2018-12-14 ENCOUNTER — Ambulatory Visit (HOSPITAL_COMMUNITY)
Admission: RE | Admit: 2018-12-14 | Discharge: 2018-12-14 | Disposition: A | Discharge status: Home / Self Care | Payer: Self-pay | Source: Ambulatory Visit | Attending: Obstetrics and Gynecology | Admitting: Obstetrics and Gynecology

## 2018-12-14 ENCOUNTER — Encounter (HOSPITAL_COMMUNITY): Payer: Self-pay

## 2018-12-14 ENCOUNTER — Ambulatory Visit
Admission: RE | Admit: 2018-12-14 | Discharge: 2018-12-14 | Disposition: A | Discharge status: Home / Self Care | Payer: No Typology Code available for payment source | Source: Ambulatory Visit | Attending: Obstetrics and Gynecology | Admitting: Obstetrics and Gynecology

## 2018-12-14 ENCOUNTER — Other Ambulatory Visit: Payer: Self-pay

## 2018-12-14 ENCOUNTER — Ambulatory Visit: Payer: Self-pay | Attending: Family Medicine | Admitting: Physician Assistant

## 2018-12-14 VITALS — BP 145/92 | HR 89 | Temp 98.0°F | Ht 64.0 in | Wt 212.0 lb

## 2018-12-14 DIAGNOSIS — I10 Essential (primary) hypertension: Secondary | ICD-10-CM

## 2018-12-14 DIAGNOSIS — N898 Other specified noninflammatory disorders of vagina: Secondary | ICD-10-CM

## 2018-12-14 DIAGNOSIS — H539 Unspecified visual disturbance: Secondary | ICD-10-CM

## 2018-12-14 DIAGNOSIS — Z1231 Encounter for screening mammogram for malignant neoplasm of breast: Secondary | ICD-10-CM

## 2018-12-14 DIAGNOSIS — R42 Dizziness and giddiness: Secondary | ICD-10-CM

## 2018-12-14 DIAGNOSIS — R079 Chest pain, unspecified: Secondary | ICD-10-CM

## 2018-12-14 DIAGNOSIS — Z01419 Encounter for gynecological examination (general) (routine) without abnormal findings: Secondary | ICD-10-CM | POA: Insufficient documentation

## 2018-12-14 DIAGNOSIS — Z131 Encounter for screening for diabetes mellitus: Secondary | ICD-10-CM

## 2018-12-14 MED ORDER — HYDROCHLOROTHIAZIDE 25 MG PO TABS: 25.0000 mg | ORAL_TABLET | Freq: Every day | ORAL | 6 refills | Status: DC

## 2018-12-14 MED FILL — HYDROCHLOROTHIAZIDE 25 MG T: 25 | 30 days supply | Qty: 30 | Fill #0

## 2018-12-14 NOTE — Progress Notes (Signed)
Patient ID: Renee Collier, female   DOB: 1970/05/04, 48 y.o.   MRN: [id number]   Renee Collier, is a 48 y.o. female  [id number]  [place]:[id number]  DOB - 1970/10/29  Subjective:  Chief Complaint and HPI: Renee Collier is a 48 y.o. female here today to get RF on BP meds.  She has been completely out of meds for ~ 76months.  Just in the last few weeks she has noticed her vision is a little blurry at time, experiences HA, dizziness, and even occasional pain across her chest.  The pain in her chest has been worse with lifting and moving things and she thinks it is from that.  No diaphoresis.  No jaw or arm pain.  Pain is sharp.  No SOB or pressure on chest.  Pain lasts a few weeks then goes away.  Does not occur with brisk walking.  ROS:   Constitutional:  No f/c, No night sweats, No unexplained weight loss. EENT:  No hearing changes. No mouth, throat, or ear problems.  Respiratory: No cough, No SOB Cardiac: + CP, no palpitations GI:  No abd pain, No N/V/D. GU: No Urinary s/sx Musculoskeletal: No joint pain Neuro: + headache, no dizziness, no motor weakness.  Skin: No rash Endocrine:  No polydipsia. No polyuria.  Psych: Denies SI/HI  No problems updated.  ALLERGIES: No Known Allergies  PAST MEDICAL HISTORY: Past Medical History:  Diagnosis Date  . Hypertension     MEDICATIONS AT HOME: Prior to Admission medications   Medication Sig Start Date End Date Taking? Authorizing Provider  hydrochlorothiazide (HYDRODIURIL) 25 MG tablet Take 1 tablet (25 mg total) by mouth daily. 12/14/18  Yes Jary Louvier M, PA-C  ibuprofen (ADVIL,MOTRIN) 600 MG tablet Take 1 tablet (600 mg total) by mouth every 6 (six) hours as needed. Patient not taking: Reported on 05/24/2018 04/08/18   Robyn Haber, MD     Objective:  EXAM:   Vitals:   12/14/18 1346  BP: (!) 145/92  Pulse: 89  Temp: 98 F (36.7 C)  TempSrc: Oral  SpO2: 100%  Weight: 212 lb (96.2 kg)  Height: 5\' 4"  (1.626 m)     General appearance : A&OX3. NAD. Non-toxic-appearing HEENT: Atraumatic and Normocephalic.  PERRLA. EOM intact. Neck: supple, no JVD. No cervical lymphadenopathy. No thyromegaly Chest/Lungs:  Breathing-non-labored, Good air entry bilaterally, breath sounds normal without rales, rhonchi, or wheezing  CVS: S1 S2 regular, no murmurs, gallops, rubs  Extremities: Bilateral Lower Ext shows no edema, both legs are warm to touch with = pulse throughout Neurology:  CN II-XII grossly intact, Non focal.   Psych:  TP linear. J/I WNL. Normal speech. Appropriate eye contact and affect.  Skin:  No Rash  Data Review Lab Results  Component Value Date   HGBA1C 5.8 (H) 01/04/2017     Assessment & Plan   1. Hypertension, unspecified type Uncontrolled off meds-likely cause of HA.  Resume meds and check BP OOO 3 to 5 times weekly and record and f/up Luke 1 month.   - hydrochlorothiazide (HYDRODIURIL) 25 MG tablet; Take 1 tablet (25 mg total) by mouth daily.  Dispense: 30 tablet; Refill: 6 - Comprehensive metabolic panel  2. Dizziness Likely BP and not drinking adequate water - hydrochlorothiazide (HYDRODIURIL) 25 MG tablet; Take 1 tablet (25 mg total) by mouth daily.  Dispense: 30 tablet; Refill: 6 - Comprehensive metabolic panel - TSH - Vitamin D, 25-hydroxy  3. Screening for diabetes mellitus - Hemoglobin A1c  4. Vision changes Likely age  related changes - TSH - Hemoglobin A1c  5.  Chest pain-no ST changes.  Likely M-S.  advil or tylenol.  Call 911 if changes or severe.     Patient have been counseled extensively about nutrition and exercise  Return in about 1 month (around 01/13/2019) for 1 month with [facility] for BP check and 3 months with Dr Margarita Rana for BP.  The patient was given clear instructions to go to ER or return to medical center if symptoms don't improve, worsen or new problems develop. The patient verbalized understanding. The patient was told to call to get lab results if they  haven't heard anything in the next week.     [name], PA-C [facility] and [facility] [place], [place]   12/14/2018, 2:00 PM

## 2018-12-14 NOTE — Patient Instructions (Addendum)
Call 911 if you have severe Chest pain or are unable to breathe.  Check your blood pressure about 3 times weekly and record and bring to next visit.     Chest Wall Pain Chest wall pain is pain in or around the bones and muscles of your chest. Chest wall pain may be caused by:  An injury.  Coughing a lot.  Using your chest and arm muscles too much. Sometimes, the cause may not be known. This pain may take a few weeks or longer to get better. Follow these instructions at home: Managing pain, stiffness, and swelling If told, put ice on the painful area:  Put ice in a plastic bag.  Place a towel between your skin and the bag.  Leave the ice on for 20 minutes, 2-3 times a day.  Activity  Rest as told by your doctor.  Avoid doing things that cause pain. This includes lifting heavy items.  Ask your doctor what activities are safe for you. General instructions   Take over-the-counter and prescription medicines only as told by your doctor.  Do not use any products that contain nicotine or tobacco, such as cigarettes, e-cigarettes, and chewing tobacco. If you need help quitting, ask your doctor.  Keep all follow-up visits as told by your doctor. This is important. Contact a doctor if:  You have a fever.  Your chest pain gets worse.  You have new symptoms. Get help right away if:  You feel sick to your stomach (nauseous) or you throw up (vomit).  You feel sweaty or light-headed.  You have a cough with mucus from your lungs (sputum) or you cough up blood.  You are short of breath. These symptoms may be an emergency. Do not wait to see if the symptoms will go away. Get medical help right away. Call your local emergency services (911 in the [place] Do not drive yourself to the hospital. Summary  Chest wall pain is pain in or around the bones and muscles of your chest.  It may be treated with ice, rest, and medicines. Your condition may also get better if you avoid doing  things that cause pain.  Contact a doctor if you have a fever, chest pain that gets worse, or new symptoms.  Get help right away if you feel light-headed or you get short of breath. These symptoms may be an emergency. This information is not intended to replace advice given to you by your health care provider. Make sure you discuss any questions you have with your health care provider. Document Released: 07/07/2007 Document Revised: 07/21/2017 Document Reviewed: 07/21/2017 Elsevier Patient Education  [place]. Dizziness Dizziness is a common problem. It makes you feel unsteady or light-headed. You may feel like you are about to pass out (faint). Dizziness can lead to getting hurt if you stumble or fall. Dizziness can be caused by many things, including:  Medicines.  Not having enough water in your body (dehydration).  Illness. Follow these instructions at home: Eating and drinking   Drink enough fluid to keep your pee (urine) clear or pale yellow. This helps to keep you from getting dehydrated. Try to drink more clear fluids, such as water.  Do not drink alcohol.  Limit how much caffeine you drink or eat, if your doctor tells you to do that.  Limit how much salt (sodium) you drink or eat, if your doctor tells you to do that. Activity   Avoid making quick movements. ? When you stand  up from sitting in a chair, steady yourself until you feel okay. ? In the morning, first sit up on the side of the bed. When you feel okay, stand slowly while you hold onto something. Do this until you know that your balance is fine.  If you need to stand in one place for a long time, move your legs often. Tighten and relax the muscles in your legs while you are standing.  Do not drive or use heavy machinery if you feel dizzy.  Avoid bending down if you feel dizzy. Place items in your home so you can reach them easily without leaning over. Lifestyle  Do not use any products that contain  nicotine or tobacco, such as cigarettes and e-cigarettes. If you need help quitting, ask your doctor.  Try to lower your stress level. You can do this by using methods such as yoga or meditation. Talk with your doctor if you need help. General instructions  Watch your dizziness for any changes.  Take over-the-counter and prescription medicines only as told by your doctor. Talk with your doctor if you think that you are dizzy because of a medicine that you are taking.  Tell a friend or a family member that you are feeling dizzy. If he or she notices any changes in your behavior, have this person call your doctor.  Keep all follow-up visits as told by your doctor. This is important. Contact a doctor if:  Your dizziness does not go away.  Your dizziness or light-headedness gets worse.  You feel sick to your stomach (nauseous).  You have trouble hearing.  You have new symptoms.  You are unsteady on your feet.  You feel like the room is spinning. Get help right away if:  You throw up (vomit) or have watery poop (diarrhea), and you cannot eat or drink anything.  You have trouble: ? Talking. ? Walking. ? Swallowing. ? Using your arms, hands, or legs.  You feel generally weak.  You are not thinking clearly, or you have trouble forming sentences. A friend or family member may notice this.  You have: ? Chest pain. ? Pain in your belly (abdomen). ? Shortness of breath. ? Sweating.  Your vision changes.  You are bleeding.  You have a very bad headache.  You have neck pain or a stiff neck.  You have a fever. These symptoms may be an emergency. Do not wait to see if the symptoms will go away. Get medical help right away. Call your local emergency services (911 in the [place] Do not drive yourself to the hospital. Summary  Dizziness makes you feel unsteady or light-headed. You may feel like you are about to pass out (faint).  Drink enough fluid to keep your pee (urine)  clear or pale yellow. Do not drink alcohol.  Avoid making quick movements if you feel dizzy.  Watch your dizziness for any changes. This information is not intended to replace advice given to you by your health care provider. Make sure you discuss any questions you have with your health care provider. Document Released: 01/07/2011 Document Revised: 01/21/2017 Document Reviewed: 02/05/2016 Elsevier Patient Education  2020 [employer].

## 2018-12-14 NOTE — Progress Notes (Signed)
Patient stated she noticed a little blood left breast 2-3 weeks ago but stated she thinks she scratched it during her sleep. Patient stated that she only noticed one time and blood was near nipple.   Pap Smear: Pap smear completed today. Last Pap smear was 08/16/2017 at [facility] and normal with positive HPV. Per patient has no history of an abnormal Pap smear. Last Pap smear result is in Epic.  Physical exam: Breasts Breasts symmetrical. No skin abnormalities bilateral breasts. No nipple retraction bilateral breasts. No nipple discharge bilateral breasts. Unable to express any nipple discharge from left breast on exam. No lymphadenopathy. No lumps palpated bilateral breasts. No complaints of pain or tenderness on exam. Referred patient to the [facility] for a screening mammogram. Appointment scheduled for Thursday, December 14, 2018 at 1200.        Pelvic/Bimanual   Ext Genitalia No lesions, no swelling and no discharge observed on external genitalia.         Vagina Vagina pink and normal texture. No lesions and thick yellowish colored discharge observed in vagina. Wet prep completed.          Cervix Cervix is present. Cervix pink and of normal texture. Thick yellowish colored discharge observed on cervical os.    Uterus Uterus is present and palpable. Uterus in normal position and normal size.       Adnexae Bilateral ovaries present and palpable. No tenderness on palpation.         Rectovaginal No rectal exam completed today since patient had no rectal complaints. No skin abnormalities observed on exam.    Smoking History: Patient has never smoked.  Patient Navigation: Patient education provided. Access to services provided for patient through [facility] program.   Breast and Cervical Cancer Risk Assessment: Patient has no family history of breast cancer, known genetic mutations, or radiation treatment to the chest before age 77. Patient  has no history of cervical dysplasia, immunocompromised, or DES exposure in-utero.  Risk Assessment    Risk Scores      12/14/2018 11/10/2017   Last edited by: Loletta Parish, RN Armond Hang, LPN   5-year risk: 0.9 % 0.8 %   Lifetime risk: 9.2 % 9.4 %

## 2018-12-14 NOTE — Patient Instructions (Signed)
Explained breast self awareness with [name]. Let patient know that her next Pap smear will be due based on the result of today's Pap smear. Referred patient to the [facility] for a screening mammogram. Appointment scheduled for Thursday, December 14, 2018 at 1200. Patient aware of appointment and will be there. Let patient know will follow up with her within the next week with results of Pap smear and wet prep by phone. Informed patient that the [facility] will follow-up with her within the next couple of weeks with results of mammogram by letter or phone. [name] verbalized understanding.  Seibert Keeter, Arvil Chaco, RN 10:38 AM

## 2018-12-15 LAB — CERVICOVAGINAL ANCILLARY ONLY
Bacterial Vaginitis (gardnerella): POSITIVE — AB
Candida Glabrata: NEGATIVE
Candida Vaginitis: NEGATIVE
Comment: NEGATIVE
Comment: NEGATIVE
Comment: NEGATIVE
Comment: NEGATIVE
Trichomonas: POSITIVE — AB

## 2018-12-15 LAB — COMPREHENSIVE METABOLIC PANEL
ALT: 8 IU/L (ref 0–32)
AST: 13 IU/L (ref 0–40)
Albumin/Globulin Ratio: 1.3 (ref 1.2–2.2)
Albumin: 4 g/dL (ref 3.8–4.8)
Alkaline Phosphatase: 139 IU/L — ABNORMAL HIGH (ref 39–117)
BUN/Creatinine Ratio: 9 (ref 9–23)
BUN: 9 mg/dL (ref 6–24)
Bilirubin Total: <0.2 mg/dL (ref 0.0–1.2)
CO2: 24 mmol/L (ref 20–29)
Calcium: 9.5 mg/dL (ref 8.7–10.2)
Chloride: 105 mmol/L (ref 96–106)
Creatinine, Ser: 0.97 mg/dL (ref 0.57–1.00)
GFR calc Af Amer: 81 mL/min/{1.73_m2} (ref >59)
GFR calc non Af Amer: 70 mL/min/{1.73_m2} (ref >59)
Globulin, Total: 3.2 g/dL (ref 1.5–4.5)
Glucose: 114 mg/dL — ABNORMAL HIGH (ref 65–99)
Potassium: 3.7 mmol/L (ref 3.5–5.2)
Sodium: 145 mmol/L — ABNORMAL HIGH (ref 134–144)
Total Protein: 7.2 g/dL (ref 6.0–8.5)

## 2018-12-15 LAB — VITAMIN D 25 HYDROXY (VIT D DEFICIENCY, FRACTURES): Vit D, 25-Hydroxy: 11.4 ng/mL — ABNORMAL LOW (ref 30.0–100.0)

## 2018-12-15 LAB — TSH: TSH: 1.16 u[IU]/mL (ref 0.450–4.500)

## 2018-12-15 LAB — HEMOGLOBIN A1C
Est. average glucose Bld gHb Est-mCnc: 120 mg/dL
Hgb A1c MFr Bld: 5.8 % — ABNORMAL HIGH (ref 4.8–5.6)

## 2018-12-18 ENCOUNTER — Other Ambulatory Visit: Payer: Self-pay | Admitting: Obstetrics and Gynecology

## 2018-12-18 MED ORDER — METRONIDAZOLE 500 MG PO TABS: 500.0000 mg | ORAL_TABLET | Freq: Two times a day (BID) | ORAL | 0 refills | Status: DC

## 2018-12-18 MED FILL — metroNIDAZOLE 500 MG TABS: 500 | 7 days supply | Qty: 14 | Fill #0

## 2018-12-19 ENCOUNTER — Telehealth (HOSPITAL_COMMUNITY): Payer: Self-pay | Admitting: *Deleted

## 2018-12-19 NOTE — Telephone Encounter (Signed)
Attempted to call patient to discuss wet prep results. No one answered the phone. Left voicemail for patient to call me back.

## 2018-12-19 NOTE — Telephone Encounter (Signed)
Patient returned my phone call. Let patient know that her wet prep showed BV and Trichomonas. Told patient that her partner will need to be treated also for Trichomonas and that it is a STD. Informed patient that a prescription for Flagyl has been sent to her pharmacy to treat. Verified pharmacy with patient. Explained to patient that she will need to avoid alcohol while taking Flagyl and it is taken BID x 7 days. Patient verbalized understanding.

## 2018-12-20 ENCOUNTER — Other Ambulatory Visit: Payer: Self-pay | Admitting: Physician Assistant

## 2018-12-20 MED ORDER — VITAMIN D (ERGOCALCIFEROL) 1.25 MG (50000 UNIT) PO CAPS: 50000.0000 [IU] | ORAL_CAPSULE | ORAL | 0 refills | Status: DC

## 2018-12-20 MED FILL — VIT D2 1.25 MG (50,000 UNIT: 1.25 MG | 28 days supply | Qty: 4 | Fill #0

## 2018-12-21 LAB — CYTOLOGY - PAP
Comment: NEGATIVE
Diagnosis: NEGATIVE
High risk HPV: NEGATIVE

## 2019-01-01 ENCOUNTER — Other Ambulatory Visit: Payer: Self-pay

## 2019-01-01 ENCOUNTER — Ambulatory Visit: Payer: Self-pay | Attending: Family Medicine

## 2019-01-15 ENCOUNTER — Telehealth (HOSPITAL_COMMUNITY): Payer: Self-pay | Admitting: *Deleted

## 2019-01-15 NOTE — Telephone Encounter (Signed)
Normal Pap smear result letter mailed to patient.

## 2019-02-21 ENCOUNTER — Ambulatory Visit: Payer: Self-pay | Attending: Family Medicine | Admitting: Family Medicine

## 2019-02-21 ENCOUNTER — Other Ambulatory Visit: Payer: Self-pay

## 2019-02-21 ENCOUNTER — Encounter: Payer: Self-pay | Admitting: Family Medicine

## 2019-02-21 DIAGNOSIS — B349 Viral infection, unspecified: Secondary | ICD-10-CM

## 2019-02-21 DIAGNOSIS — R519 Headache, unspecified: Secondary | ICD-10-CM

## 2019-02-21 DIAGNOSIS — R05 Cough: Secondary | ICD-10-CM

## 2019-02-21 NOTE — Progress Notes (Signed)
Patient has been called and DOB has been verified. Patient has been screened and transferred to PCP to start phone visit.  When she cough her chest hurts vomiting and diarrhea this morning. Body aching.  No energy.

## 2019-02-21 NOTE — Progress Notes (Signed)
Virtual Visit via Telephone Note  I connected with Renee Collier, on 02/21/2019 at 9:48 AM by telephone due to the COVID-19 pandemic and verified that I am speaking with the correct person using two identifiers.   Consent: I discussed the limitations, risks, security and privacy concerns of performing an evaluation and management service by telephone and the availability of in person appointments. I also discussed with the patient that there may be a patient responsible charge related to this service. The patient expressed understanding and agreed to proceed.   Location of Patient: Home  Location of Provider: Clinic   Persons participating in Telemedicine visit: Ceriah Paelynn Moffit [place]rington-CMA Dr. Margarita Rana     History of Present Illness: 49 year old female with a history of hypertension seen today for an acute visit. 2 days ago she woke up with a dry throat, associated pain. Has had some chest pain and rhinorrhea but no dyspnea. She has no sinus pain or pressure. She has had no energy; had vomiting and diarrhea this morning; she is not able to check her temperature as she has no thermometer but has had alternating cold and hot sensations. She works at [employer] and is unsure of any sick contacts.  Past Medical History:  Diagnosis Date  . Hypertension    No Known Allergies  Current Outpatient Medications on File Prior to Visit  Medication Sig Dispense Refill  . hydrochlorothiazide (HYDRODIURIL) 25 MG tablet Take 1 tablet (25 mg total) by mouth daily. 30 tablet 6  . Vitamin D, Ergocalciferol, (DRISDOL) 1.25 MG (50000 UT) CAPS capsule Take 1 capsule (50,000 Units total) by mouth every 7 (seven) days. 16 capsule 0  . ibuprofen (ADVIL,MOTRIN) 600 MG tablet Take 1 tablet (600 mg total) by mouth every 6 (six) hours as needed. (Patient not taking: Reported on 05/24/2018) 30 tablet 0  . metroNIDAZOLE (FLAGYL) 500 MG tablet Take 1 tablet (500 mg total) by mouth 2 (two) times daily.  (Patient not taking: Reported on 02/21/2019) 14 tablet 0   No current facility-administered medications on file prior to visit.    Observations/Objective: Alert, awake, oriented x3 Not in acute distress  Assessment and Plan: 1. Viral syndrome We will need to exclude SARS-CoV-2 Provided information to schedule testing appointment Supportive therapy BRAT diet, hydration, rest Discussed recommendations regarding testing results.   Follow Up Instructions: Keep previously scheduled appointment   I discussed the assessment and treatment plan with the patient. The patient was provided an opportunity to ask questions and all were answered. The patient agreed with the plan and demonstrated an understanding of the instructions.   The patient was advised to call back or seek an in-person evaluation if the symptoms worsen or if the condition fails to improve as anticipated.     I provided 15 minutes total of non-face-to-face time during this encounter including median intraservice time, reviewing previous notes, investigations, ordering medications, medical decision making, coordinating care and patient verbalized understanding at the end of the visit.     Charlott Rakes, MD, FAAFP. [facility] and [place], [place]   02/21/2019, 9:48 AM

## 2019-02-28 ENCOUNTER — Ambulatory Visit: Payer: No Typology Code available for payment source | Attending: Internal Medicine

## 2019-03-21 ENCOUNTER — Other Ambulatory Visit: Payer: Self-pay

## 2019-03-21 ENCOUNTER — Ambulatory Visit: Payer: Self-pay | Attending: Family Medicine | Admitting: Physician Assistant

## 2019-03-21 VITALS — BP 115/80 | HR 91 | Temp 98.2°F | Resp 16 | Ht 63.0 in | Wt 206.0 lb

## 2019-03-21 DIAGNOSIS — R079 Chest pain, unspecified: Secondary | ICD-10-CM | POA: Insufficient documentation

## 2019-03-21 DIAGNOSIS — B029 Zoster without complications: Secondary | ICD-10-CM | POA: Insufficient documentation

## 2019-03-21 DIAGNOSIS — N644 Mastodynia: Secondary | ICD-10-CM | POA: Insufficient documentation

## 2019-03-21 DIAGNOSIS — Z79899 Other long term (current) drug therapy: Secondary | ICD-10-CM | POA: Insufficient documentation

## 2019-03-21 DIAGNOSIS — R0789 Other chest pain: Secondary | ICD-10-CM

## 2019-03-21 DIAGNOSIS — I10 Essential (primary) hypertension: Secondary | ICD-10-CM | POA: Insufficient documentation

## 2019-03-21 MED ORDER — METHOCARBAMOL 500 MG PO TABS: 1000.0000 mg | ORAL_TABLET | Freq: Four times a day (QID) | ORAL | 0 refills | Status: DC | PRN

## 2019-03-21 MED ORDER — IBUPROFEN 600 MG PO TABS: 600.0000 mg | ORAL_TABLET | Freq: Four times a day (QID) | ORAL | 0 refills | Status: DC | PRN

## 2019-03-21 MED ORDER — GABAPENTIN 300 MG PO CAPS: 300.0000 mg | ORAL_CAPSULE | Freq: Three times a day (TID) | ORAL | 3 refills | Status: DC

## 2019-03-21 MED ORDER — VALACYCLOVIR HCL 1 G PO TABS: 1000.0000 mg | ORAL_TABLET | Freq: Three times a day (TID) | ORAL | 0 refills | Status: DC

## 2019-03-21 MED FILL — ?IBUPROFEN 600 MG TABLETS: 600 | 15 days supply | Qty: 60 | Fill #0

## 2019-03-21 MED FILL — METHOCARBAMOL 500 MG TABS: 500 | 11 days supply | Qty: 90 | Fill #0

## 2019-03-21 MED FILL — valACYclovir HCL 1 GM TABS: 1 | 7 days supply | Qty: 21 | Fill #0

## 2019-03-21 MED FILL — GABAPENTIN 300 MG CAPSULE: 300 | 20 days supply | Qty: 60 | Fill #0

## 2019-03-21 NOTE — Progress Notes (Signed)
Patient ID: Renee Collier, female   DOB: September 17, 1970, 49 y.o.   MRN: [id number]   Renee Collier, is a 49 y.o. female  [id number]  [place]:[id number]  DOB - [birth year]  Subjective:  Chief Complaint and HPI: Renee Collier is a 49 y.o. female here today with L chest/breast pain.  MMG 12/2018 and normal.  Just finished period.  No lump/mass.  Pain is in L axiallary region and wraps around toward front of chest.  Sharp pain alternating with deep ache.  Not alleviated with ibuprofen OTC.  No N/V/D.  Does a lot of unpacking etc with her job at [employer].  Pain for 3 days now.  Hurts enough to make her feel irritable.  Affecting her ability to sleep.  Feels ok otherwise.  No N/V/D.  No cough.  No arm or jaw pain.  No FH early cardiac events.  No fever.  No nipple discharge  ROS:   Constitutional:  No f/c, No night sweats, No unexplained weight loss. EENT:  No vision changes, No blurry vision, No hearing changes. No mouth, throat, or ear problems.  Respiratory: No cough, No SOB Cardiac: + CP, no palpitations GI:  No abd pain, No N/V/D. GU: No Urinary s/sx Musculoskeletal: No joint pain Neuro: No headache, no dizziness, no motor weakness.  Skin: No rash Endocrine:  No polydipsia. No polyuria.  Psych: Denies SI/HI  No problems updated.  ALLERGIES: No Known Allergies  PAST MEDICAL HISTORY: Past Medical History:  Diagnosis Date  . Hypertension     MEDICATIONS AT HOME: Prior to Admission medications   Medication Sig Start Date End Date Taking? Authorizing Provider  hydrochlorothiazide (HYDRODIURIL) 25 MG tablet Take 1 tablet (25 mg total) by mouth daily. 12/14/18  Yes Tannie Koskela, Dionne Bucy, PA-C  Vitamin D, Ergocalciferol, (DRISDOL) 1.25 MG (50000 UT) CAPS capsule Take 1 capsule (50,000 Units total) by mouth every 7 (seven) days. 12/20/18  Yes [name] M, PA-C  ibuprofen (ADVIL) 600 MG tablet Take 1 tablet (600 mg total) by mouth every 6 (six) hours as needed. For pain 03/21/19    Argentina Donovan, PA-C  methocarbamol (ROBAXIN) 500 MG tablet Take 2 tablets (1,000 mg total) by mouth every 6 (six) hours as needed for muscle spasms. 03/21/19   Argentina Donovan, PA-C  metroNIDAZOLE (FLAGYL) 500 MG tablet Take 1 tablet (500 mg total) by mouth 2 (two) times daily. Patient not taking: Reported on 02/21/2019 12/18/18   Constant, Peggy, MD  valACYclovir (VALTREX) 1000 MG tablet Take 1 tablet (1,000 mg total) by mouth 3 (three) times daily. 03/21/19   Argentina Donovan, PA-C     Objective:  EXAM:   Vitals:   03/21/19 1430  BP: 115/80  Pulse: 91  Resp: 16  Temp: 98.2 F (36.8 C)  SpO2: 99%  Weight: 206 lb (93.4 kg)  Height: 5\' 3"  (1.6 m)    General appearance : A&OX3. NAD. Non-toxic-appearing HEENT: Atraumatic and Normocephalic.  PERRLA. EOM intact.   Neck: supple, no JVD. No cervical lymphadenopathy. No thyromegaly Chest/Lungs:  Breathing-non-labored, Good air entry bilaterally, breath sounds normal without rales, rhonchi, or wheezing  CVS: S1 S2 regular, no murmurs, gallops, rubs  TTP and dysthesia around T5 on L Extremities: Bilateral Lower Ext shows no edema, both legs are warm to touch with = pulse throughout Neurology:  CN II-XII grossly intact, Non focal.   Psych:  TP linear. J/I WNL. Normal speech. Appropriate eye contact and affect.  Skin:  No Rash  Data  Review Lab Results  Component Value Date   HGBA1C 5.8 (H) 12/14/2018   HGBA1C 5.8 (H) 01/04/2017     Assessment & Plan   1. Herpes zoster without complication Likely prodrome - ibuprofen (ADVIL) 600 MG tablet; Take 1 tablet (600 mg total) by mouth every 6 (six) hours as needed. For pain  Dispense: 60 tablet; Refill: 0 - valACYclovir (VALTREX) 1000 MG tablet; Take 1 tablet (1,000 mg total) by mouth 3 (three) times daily.  Dispense: 21 tablet; Refill: 0 - methocarbamol (ROBAXIN) 500 MG tablet; Take 2 tablets (1,000 mg total) by mouth every 6 (six) hours as needed for muscle spasms.  Dispense: 90  tablet; Refill: 0  2. Chest pain, unspecified type No ST changes on EKG - ibuprofen (ADVIL) 600 MG tablet; Take 1 tablet (600 mg total) by mouth every 6 (six) hours as needed. For pain  Dispense: 60 tablet; Refill: 0 - methocarbamol (ROBAXIN) 500 MG tablet; Take 2 tablets (1,000 mg total) by mouth every 6 (six) hours as needed for muscle spasms.  Dispense: 90 tablet; Refill: 0     Patient have been counseled extensively about nutrition and exercise  Return if symptoms worsen or fail to improve.  The patient was given clear instructions to go to ER or return to medical center if symptoms don't improve, worsen or new problems develop. The patient verbalized understanding. The patient was told to call to get lab results if they haven't heard anything in the next week.     [name], PA-C [facility] [place], [place]   03/21/2019, 3:01 PM

## 2019-03-21 NOTE — Progress Notes (Signed)
c /o left breast pain X 3 day. Denies any nipple discharge/  Denies any lumps/ denies any family HX of breast cancer  EKG done today / procedure well tolerated/ results in Epic/ made provider aware

## 2019-03-21 NOTE — Patient Instructions (Addendum)
Drink 80-100 ounces water daily   Shingles  Shingles is an infection. It gives you a painful skin rash and blisters that have fluid in them. Shingles is caused by the same germ (virus) that causes chickenpox. Shingles only happens in people who:  Have had chickenpox.  Have been given a shot of medicine (vaccine) to protect against chickenpox. Shingles is rare in this group. The first symptoms of shingles may be itching, tingling, or pain in an area on your skin. A rash will show on your skin a few days or weeks later. The rash is likely to be on one side of your body. The rash usually has a shape like a belt or a band. Over time, the rash turns into fluid-filled blisters. The blisters will break open, change into scabs, and dry up. Medicines may:  Help with pain and itching.  Help you get better sooner.  Help to prevent long-term problems. Follow these instructions at home: Medicines  Take over-the-counter and prescription medicines only as told by your doctor.  Put on an anti-itch cream or numbing cream where you have a rash, blisters, or scabs. Do this as told by your doctor. Helping with itching and discomfort   Put cold, wet cloths (cold compresses) on the area of the rash or blisters as told by your doctor.  Cool baths can help you feel better. Try adding baking soda or dry oatmeal to the water to lessen itching. Do not bathe in hot water. Blister and rash care  Keep your rash covered with a loose bandage (dressing).  Wear loose clothing that does not rub on your rash.  Keep your rash and blisters clean. To do this, wash the area with mild soap and cool water as told by your doctor.  Check your rash every day for signs of infection. Check for: ? More redness, swelling, or pain. ? Fluid or blood. ? Warmth. ? Pus or a bad smell.  Do not scratch your rash. Do not pick at your blisters. To help you to not scratch: ? Keep your fingernails clean and cut short. ? Wear  gloves or mittens when you sleep, if scratching is a problem. General instructions  Rest as told by your doctor.  Keep all follow-up visits as told by your doctor. This is important.  Wash your hands often with soap and water. If soap and water are not available, use hand sanitizer. Doing this lowers your chance of getting a skin infection caused by germs (bacteria).  Your infection can cause chickenpox in people who have never had chickenpox or never got a shot of chickenpox vaccine. If you have blisters that did not change into scabs yet, try not to touch other people or be around other people, especially: ? Babies. ? Pregnant women. ? Children who have areas of red, itchy, or rough skin (eczema). ? Very old people who have transplants. ? People who have a long-term (chronic) sickness, like cancer or AIDS. Contact a doctor if:  Your pain does not get better with medicine.  Your pain does not get better after the rash heals.  You have any signs of infection in the rash area. These signs include: ? More redness, swelling, or pain around the rash. ? Fluid or blood coming from the rash. ? The rash area feeling warm to the touch. ? Pus or a bad smell coming from the rash. Get help right away if:  The rash is on your face or nose.  You have  pain in your face or pain by your eye.  You lose feeling on one side of your face.  You have trouble seeing.  You have ear pain, or you have ringing in your ear.  You have a loss of taste.  Your condition gets worse. Summary  Shingles gives you a painful skin rash and blisters that have fluid in them.  Shingles is an infection. It is caused by the same germ (virus) that causes chickenpox.  Keep your rash covered with a loose bandage (dressing). Wear loose clothing that does not rub on your rash.  If you have blisters that did not change into scabs yet, try not to touch other people or be around people. This information is not intended  to replace advice given to you by your health care provider. Make sure you discuss any questions you have with your health care provider. Document Revised: 05/12/2018 Document Reviewed: 09/22/2016 Elsevier Patient Education  2020 [employer].

## 2019-06-26 MED FILL — HYDROCHLOROTHIAZIDE 25 MG T: 25 | 30 days supply | Qty: 30 | Fill #1

## 2019-08-16 ENCOUNTER — Encounter: Payer: Self-pay | Admitting: Physician Assistant

## 2019-08-16 ENCOUNTER — Ambulatory Visit: Payer: Self-pay | Attending: Physician Assistant | Admitting: Physician Assistant

## 2019-08-16 ENCOUNTER — Other Ambulatory Visit: Payer: Self-pay

## 2019-08-16 DIAGNOSIS — M25552 Pain in left hip: Secondary | ICD-10-CM

## 2019-08-16 DIAGNOSIS — M79605 Pain in left leg: Secondary | ICD-10-CM

## 2019-08-16 MED ORDER — NAPROXEN 500 MG PO TABS: 500.0000 mg | ORAL_TABLET | Freq: Two times a day (BID) | ORAL | 0 refills | Status: DC

## 2019-08-16 MED ORDER — CYCLOBENZAPRINE HCL 10 MG PO TABS: ORAL_TABLET | ORAL | 0 refills | Status: DC

## 2019-08-16 MED FILL — CYCLOBENZAPRINE 10 MG TAB: 10 | 15 days supply | Qty: 30 | Fill #0

## 2019-08-16 MED FILL — NAPROXEN 500 MG TABLET: 500 | 30 days supply | Qty: 60 | Fill #0

## 2019-08-16 NOTE — Progress Notes (Signed)
Virtual Visit via Telephone Note  I connected with [name] on 08/16/19 at  9:30 AM EDT by telephone and verified that I am speaking with the correct person using two identifiers.   I discussed the limitations, risks, security and privacy concerns of performing an evaluation and management service by telephone and the availability of in person appointments. I also discussed with the patient that there may be a patient responsible charge related to this service. The patient expressed understanding and agreed to proceed.  PATIENT visit by telephone virtually in the context of Covid-19 pandemic. Patient location: home My Location:  [facility] office Persons on the call:  Me and the patient  History of Present Illness: Started in May, she has been having L leg pain, throbs.  Aches all the way down her leg.  NKI.  Worse with weight bearing and now hurting in L hip too.  No fever.  OTC not helping.  Pain is moderate and seems to be worsening.  No back pain.  No urinary s/sx.  No weakness.  No falls.  She has not taken prednisone in the past.     Observations/Objective: NAD. A&Ox3  Assessment and Plan: 1. Left hip pain - Ambulatory referral to Orthopedic Surgery - naproxen (NAPROSYN) 500 MG tablet; Take 1 tablet (500 mg total) by mouth 2 (two) times daily with a meal. X 10 days then prn pain  Dispense: 60 tablet; Refill: 0 - cyclobenzaprine (FLEXERIL) 10 MG tablet; 1/2 in the morning and afternoon prn muscle spasm and 1 at bedtime prn  Dispense: 30 tablet; Refill: 0  2. Left leg pain - Ambulatory referral to Orthopedic Surgery - naproxen (NAPROSYN) 500 MG tablet; Take 1 tablet (500 mg total) by mouth 2 (two) times daily with a meal. X 10 days then prn pain  Dispense: 60 tablet; Refill: 0 - cyclobenzaprine (FLEXERIL) 10 MG tablet; 1/2 in the morning and afternoon prn muscle spasm and 1 at bedtime prn  Dispense: 30 tablet; Refill: 0   Follow Up Instructions: See PCP prn   I discussed the  assessment and treatment plan with the patient. The patient was provided an opportunity to ask questions and all were answered. The patient agreed with the plan and demonstrated an understanding of the instructions.   The patient was advised to call back or seek an in-person evaluation if the symptoms worsen or if the condition fails to improve as anticipated.  I provided 13 minutes of non-face-to-face time during this encounter.   Renee Caldron, PA-C  Patient ID: Renee Collier, female   DOB: 03-16-1970, 49 y.o.   MRN: [id number]

## 2019-08-31 ENCOUNTER — Ambulatory Visit: Payer: No Typology Code available for payment source

## 2019-09-04 ENCOUNTER — Ambulatory Visit: Payer: No Typology Code available for payment source

## 2019-09-06 ENCOUNTER — Other Ambulatory Visit: Payer: Self-pay

## 2019-09-06 ENCOUNTER — Ambulatory Visit: Payer: Self-pay | Attending: Family Medicine

## 2019-09-19 ENCOUNTER — Telehealth: Payer: Self-pay | Admitting: Family Medicine

## 2019-09-19 NOTE — Telephone Encounter (Signed)
I call the Pt today since the bank statement ending 1154 is totally different of the bank statement I need 6025, she got a little upset, but I explain her that is 2 different account number, Pt understood I hope

## 2019-10-04 ENCOUNTER — Telehealth: Payer: Self-pay | Admitting: Family Medicine

## 2019-10-04 DIAGNOSIS — M25552 Pain in left hip: Secondary | ICD-10-CM

## 2019-10-04 NOTE — Telephone Encounter (Signed)
Copied from [place] [phone]. Topic: General - Other >> Oct 01, 2019  9:06 AM Leward Quan A wrote: Reason for CRM: Patient called to inquire of Dr Margarita Rana or [name] about a referral to an Orthopedic Surgeon that she was supposed to have had. Patient states that the medication is not working and that she wake from her sleep due to the pain in her leg and hip area. Please call Ph# [phone]

## 2019-10-04 NOTE — Telephone Encounter (Signed)
Patient is needing a new referral placed due to her now having [employer] insurance.

## 2019-10-05 NOTE — Telephone Encounter (Signed)
Referral to orthopedic was placed by [facility] on 08/16/2019

## 2019-10-09 NOTE — Telephone Encounter (Signed)
Done

## 2019-10-09 NOTE — Telephone Encounter (Signed)
A new referral is needed per Renee Collier.

## 2019-10-10 NOTE — Telephone Encounter (Signed)
Patient has been informed of referral being placed.  Patient states that she has spoken with [facility] to set up an appointment.

## 2019-10-18 ENCOUNTER — Encounter: Payer: Self-pay | Admitting: Orthopaedic Surgery

## 2019-10-18 ENCOUNTER — Ambulatory Visit: Payer: Self-pay

## 2019-10-18 ENCOUNTER — Other Ambulatory Visit: Payer: Self-pay

## 2019-10-18 ENCOUNTER — Ambulatory Visit (INDEPENDENT_AMBULATORY_CARE_PROVIDER_SITE_OTHER): Payer: Self-pay | Admitting: Orthopaedic Surgery

## 2019-10-18 ENCOUNTER — Ambulatory Visit (INDEPENDENT_AMBULATORY_CARE_PROVIDER_SITE_OTHER): Payer: Self-pay

## 2019-10-18 VITALS — Ht 63.0 in | Wt 214.2 lb

## 2019-10-18 DIAGNOSIS — M1612 Unilateral primary osteoarthritis, left hip: Secondary | ICD-10-CM

## 2019-10-18 NOTE — Progress Notes (Signed)
Subjective: Patient is here for ultrasound-guided intra-articular left hip injection.   Groin pain for several months.  Objective:  Pain with IR.  Procedure: Ultrasound-guided left hip injection: After sterile prep with Betadine, injected 8 cc 1% lidocaine without epinephrine and 40 mg methylprednisolone using a 22-gauge spinal needle, passing the needle through the iliofemoral ligament into the femoral head/neck junction.  Injectate seen filling capsule.  Good immediate relief.

## 2019-10-18 NOTE — Progress Notes (Signed)
Office Visit Note   Patient: Renee Collier           Date of Birth: 1970/02/05           MRN: [id number] Visit Date: 10/18/2019              Requested by: Charlott Rakes, MD [place],   [id number] PCP: Charlott Rakes, MD   Assessment & Plan: Visit Diagnoses:  1. Primary osteoarthritis of left hip     Plan: Impression is left hip moderate DJD. X-rays consistent with coxa profunda. She has's spurring of the superior acetabulum. Based on discussion of treatment options she would like to try cortisone injection. She has been actively trying to lose weight as well. We made a referral to outpatient PT. Follow-up as needed.  Follow-Up Instructions: Return if symptoms worsen or fail to improve.   Orders:  Orders Placed This Encounter  Procedures   XR HIP UNILAT W OR W/O PELVIS 2-3 VIEWS LEFT   Ambulatory referral to Physical Therapy   No orders of the defined types were placed in this encounter.     Procedures: No procedures performed   Clinical Data: No additional findings.   Subjective: Chief Complaint  Patient presents with   Left Hip - Pain    [name] is a very pleasant 49 year old female who works as a Scientist, water quality at [employer] who comes in with 3 to 4 months of chronic left hip and groin pain with some numbness in the lower leg. The pain radiates into the thigh mainly. She is having a lot of difficulty sleeping. She has tried combination of naproxen, muscle relaxer, gabapentin all without relief. Denies any constitutional symptoms. Denies any injuries.   Review of Systems  Constitutional: Negative.   HENT: Negative.   Eyes: Negative.   Respiratory: Negative.   Cardiovascular: Negative.   Endocrine: Negative.   Musculoskeletal: Negative.   Neurological: Negative.   Hematological: Negative.   Psychiatric/Behavioral: Negative.   All other systems reviewed and are negative.    Objective: Vital Signs: Ht 5\' 3"  (1.6 m)    Wt 214 lb  3.2 oz (97.2 kg)    BMI 37.94 kg/m   Physical Exam Vitals and nursing note reviewed.  Constitutional:      Appearance: She is well-developed.  HENT:     Head: Normocephalic and atraumatic.  Pulmonary:     Effort: Pulmonary effort is normal.  Abdominal:     Palpations: Abdomen is soft.  Musculoskeletal:     Cervical back: Neck supple.  Skin:    General: Skin is warm.     Capillary Refill: Capillary refill takes less than 2 seconds.  Neurological:     Mental Status: She is alert and oriented to person, place, and time.  Psychiatric:        Behavior: Behavior normal.        Thought Content: Thought content normal.        Judgment: Judgment normal.     Ortho Exam Left hip shows no significant tenderness to palpation. No sciatic tension signs. Pain with logroll, Stinchfield, internal and external rotation. Specialty Comments:  No specialty comments available.  Imaging: XR HIP UNILAT W OR W/O PELVIS 2-3 VIEWS LEFT  Result Date: 10/18/2019 Moderate left hip osteoarthritis and degenerative joint disease.    PMFS History: Patient Active Problem List   Diagnosis Date Noted   Primary osteoarthritis of left hip 10/18/2019   Well woman exam with routine gynecological exam  12/14/2018   Vaginal discharge 12/14/2018   Status post HTA endometrial ablation 04/10/2014   Abnormal uterine bleeding (AUB) 12/18/2013   Past Medical History:  Diagnosis Date   Hypertension     Family History  Problem Relation Age of Onset   Cancer Mother        cervical   Ovarian cancer Sister     Past Surgical History:  Procedure Laterality Date   DILATION AND CURETTAGE OF UTERUS N/A 04/27/2017   Procedure: Ultrasound Guided Dilatation and Curettage;  Surgeon: [name], MD;  Location: [facility];  Service: Gynecology;  Laterality: N/A;   ENDOMETRIAL ABLATION     HYSTEROSCOPY N/A 03/12/2014   Procedure: HYSTEROSCOPY WITH HYDROTHERMAL ABLATION;  Surgeon: Osborne Oman, MD;  Location: [facility];  Service: Gynecology;  Laterality: N/A;   TUBAL LIGATION     Social History   Occupational History   Not on file  Tobacco Use   Smoking status: Never Smoker   Smokeless tobacco: Never Used  Vaping Use   Vaping Use: Never used  Substance and Sexual Activity   Alcohol use: No   Drug use: No   Sexual activity: Not Currently    Birth control/protection: Surgical

## 2019-10-18 NOTE — Addendum Note (Signed)
Addended by: Marlyne Beards on: 10/18/2019 08:48 AM   Modules accepted: Orders

## 2019-10-29 ENCOUNTER — Other Ambulatory Visit: Payer: Self-pay | Admitting: Obstetrics and Gynecology

## 2019-10-29 DIAGNOSIS — Z1231 Encounter for screening mammogram for malignant neoplasm of breast: Secondary | ICD-10-CM

## 2019-11-01 ENCOUNTER — Ambulatory Visit: Payer: No Typology Code available for payment source | Admitting: Physician Assistant

## 2019-11-07 ENCOUNTER — Ambulatory Visit: Payer: Self-pay | Admitting: Physical Therapy

## 2019-11-09 ENCOUNTER — Ambulatory Visit: Payer: Self-pay | Attending: Orthopaedic Surgery | Admitting: Physical Therapy

## 2019-11-09 ENCOUNTER — Other Ambulatory Visit: Payer: Self-pay

## 2019-11-09 ENCOUNTER — Encounter: Payer: Self-pay | Admitting: Physical Therapy

## 2019-11-09 DIAGNOSIS — M25552 Pain in left hip: Secondary | ICD-10-CM | POA: Insufficient documentation

## 2019-11-09 DIAGNOSIS — R2689 Other abnormalities of gait and mobility: Secondary | ICD-10-CM | POA: Insufficient documentation

## 2019-11-09 NOTE — Therapy (Signed)
[place], [place], [address] Phone: [phone]   Fax:  [phone]  Physical Therapy Evaluation  Patient Details  Name: Renee Collier MRN: [id number] Date of Birth: [birth year] Referring Provider (PT): Dr. Frankey Shown   Encounter Date: 11/09/2019   PT End of Session - 11/09/19 0928    Visit Number 1    Number of Visits 13    Date for PT Re-Evaluation 12/22/19    PT Start Time 0930    PT Stop Time 1013    PT Time Calculation (min) 43 min    Activity Tolerance Patient tolerated treatment well    Behavior During Therapy [facility] for tasks assessed/performed           Past Medical History:  Diagnosis Date   Hypertension     Past Surgical History:  Procedure Laterality Date   DILATION AND CURETTAGE OF UTERUS N/A 04/27/2017   Procedure: Ultrasound Guided Dilatation and Curettage;  Surgeon: [name], MD;  Location: [facility];  Service: Gynecology;  Laterality: N/A;   ENDOMETRIAL ABLATION     HYSTEROSCOPY N/A 03/12/2014   Procedure: HYSTEROSCOPY WITH HYDROTHERMAL ABLATION;  Surgeon: Osborne Oman, MD;  Location: [facility];  Service: Gynecology;  Laterality: N/A;   TUBAL LIGATION      There were no vitals filed for this visit.    Subjective Assessment - 11/09/19 0933    Subjective Pt reports L hip pain has been present since March/April 2021. She states pain medication worked slightly but pain has persisted with trouble sleeping. Pt received cortisone injection in L hip earlier this year. Pt works full time at [employer] and is on her feet constantly    How long can you sit comfortably? Pt reports L hip numbness and throbbing but eases slightly at rest    How long can you stand comfortably? Pt reports L hip numbness and throbbing    How long can you walk comfortably? Pt reports L hip numbness and throbbing - has increased pain during longer shifts    Diagnostic tests Radiograph L hip  10/18/2019: Moderate left hip osteoarthritis and degenerative joint disease.    Patient Stated Goals Pt is hoping to join a gym soon and be able to walk prolonged distances without pain.    Currently in Pain? Yes    Pain Score 7     Pain Location Hip    Pain Orientation Left    Pain Descriptors / Indicators Throbbing;Numbness;Shooting;Tingling;Aching    Pain Type Chronic pain    Pain Radiating Towards down to L ankle    Pain Onset More than a month ago    Aggravating Factors  walking prolonged distances, colder temperatures    Pain Relieving Factors rest, heating pad, naproxen    Effect of Pain on Daily Activities difficulty with work requirements and walking              [facility] PT Assessment - 11/09/19 0001      Assessment   Medical Diagnosis L hip OA    Referring Provider (PT) Dr. Frankey Shown    Onset Date/Surgical Date --   March/April 2021   Hand Dominance Right    Next MD Visit 11/23/2019    Prior Therapy no      Precautions   Precautions None      Restrictions   Weight Bearing Restrictions No      Balance Screen   Has the patient fallen in the past  6 months No      Home Environment   Living Environment Private residence    Living Arrangements Children   18 yo daughter   Available Help at Discharge Family    Type of Home Apartment    Additional Comments first floor      Prior Function   Level of Independence Independent    Vocation Full time employment    Vocation Requirements Walking and being up on feet 5 days/week    Leisure walking, been on a diet for 2 weeks and trying to lose weight      Cognition   Overall Cognitive Status Within Functional Limits for tasks assessed      Observation/Other Assessments   Focus on Therapeutic Outcomes (FOTO)  N/A not loaded      Sensation   Light Touch Appears Intact    Additional Comments Numbness and tingling but WFL otherwise      Functional Tests   Functional tests Sit to Stand;Squat;Single leg stance       Squat   Comments Cues provided for optimal form; pt able to perform 2x10 wall squats against ball      Single Leg Stance   Comments Pt demonstrated difficulty with balance >10 seconds on each LE and reported pain with SLS on LLE      Sit to Stand   Comments Pt able to perform 5x STS from EOM without use of BUE      ROM / Strength   AROM / PROM / Strength AROM;Strength      AROM   Overall AROM Comments lumbar screen with no symptom aggravation noted    AROM Assessment Site Hip    Right/Left Hip Left;Right    Right Hip Flexion --   WFL   Left Hip Flexion --   WFL but pain in L groin and hip >100 degrees     Strength   Strength Assessment Site Hip    Right/Left Hip Left;Right    Right Hip Flexion 5/5    Right Hip ABduction 4-/5    Left Hip Flexion 4-/5    Left Hip ABduction 3+/5      Palpation   Palpation comment pain anteriorly L hip with FABER, scour, PROM past 100 degrees L hip FL      Special Tests    Special Tests Hip Special Tests    Hip Special Tests  Saralyn Pilar (FABER) Test;Hip Scouring      Saralyn Pilar (FABER) Test   Findings Positive    Side Left    Comments Pain in groin and anterior hip with FABER      Hip Scouring   Findings Positive    Side Left    Comments Pain in groin and anterior hip with hip scour      High Level Balance   High Level Balance Comments SLS on each leg 10 seconda with increased difficulty due to pain in LLE                      Objective measurements completed on examination: See above findings.       [facility] PT Treatment/Exercise - 11/09/19 0001      Exercises   Exercises Knee/Hip      Knee/Hip Exercises: Standing   Abduction Limitations standing hip ABD BLE 2 x 10 with BUE    Wall Squat Limitations wall squat against green physioball 20x with verbal cues for optimal technique      Knee/Hip  Exercises: Supine   Bridges Limitations supine bridges on mat 2 x 10 with 2 second hold                  PT  Education - 11/09/19 1033    Education Details Pt education provided regarding anatomy of condition, importance of strengthening and stretching to maintain/improve function, HEP    Person(s) Educated Patient    Methods Explanation;Demonstration;Tactile cues;Verbal cues;Handout    Comprehension Verbalized understanding;Returned demonstration            PT Short Term Goals - 11/09/19 1044      PT SHORT TERM GOAL #1   Title Pt will be able to ambulate for 30 minutes on level ground with less than 4/10 pain reported to allow for improved tolerance during work shifts.    Baseline Pt reports constant pain/throbbing in L hip down to ankle    Time 3    Period Weeks    Status New    Target Date 11/30/19      PT SHORT TERM GOAL #2   Title Pt will be able to tolerate L hip passive and active hip FL past 100 degrees with 0/10 pain.    Baseline Pt expresses L groin and hip pain with L hip flexion past 100 degrees.    Time 3    Period Weeks    Status New    Target Date 11/30/19             PT Long Term Goals - 11/09/19 1046      PT LONG TERM GOAL #1   Title Pt MMT L hip flexion and ABD will improve to 5/5 to allow for increased support and improved tolerance while walking at work.    Baseline 3+/5 L hip ABD and 4-/5 L hip FL    Time 6    Period Weeks    Status New    Target Date 12/21/19                  Plan - 11/09/19 1013    Clinical Impression Statement Pt presents with decreased strength in L hip compared to right secondary to pain in hip and groin. Pt is able to perform daily activities independently but has consistent throbbing pain and discomfort in L hip. Pt will benefit from skilled PT intervention to improve functional strength and decrease pain to allow pt to continue working full time, walk, and perform leisure activities with minimal discomfort.    Personal Factors and Comorbidities Fitness    Examination-Activity Limitations Stand    Examination-Participation  Restrictions Occupation    Stability/Clinical Decision Making Stable/Uncomplicated    Clinical Decision Making Low    Rehab Potential Good    PT Frequency 2x / week    PT Duration 6 weeks    PT Treatment/Interventions ADLs/Self Care Home Management;Gait training;Stair training;Functional mobility training;Therapeutic activities;Therapeutic exercise;Balance training;Neuromuscular re-education;Manual techniques;Patient/family education;Passive range of motion;Dry needling;Vasopneumatic Device    PT Next Visit Plan FOTO, pt education on HEP, L hip long axis distraction    PT Home Exercise Plan [id number] - mini squats, supine bridges, standing hip ABD BLE    Consulted and Agree with Plan of Care Patient           Patient will benefit from skilled therapeutic intervention in order to improve the following deficits and impairments:  Decreased range of motion, Decreased endurance, Decreased activity tolerance, Decreased balance, Decreased strength, Decreased mobility, Pain, Difficulty walking  Visit Diagnosis: Pain  in left hip - Plan: PT plan of care cert/re-cert  Other abnormalities of gait and mobility - Plan: PT plan of care cert/re-cert     Problem List Patient Active Problem List   Diagnosis Date Noted   Primary osteoarthritis of left hip 10/18/2019   Well woman exam with routine gynecological exam 12/14/2018   Vaginal discharge 12/14/2018   Status post HTA endometrial ablation 04/10/2014   Abnormal uterine bleeding (AUB) 12/18/2013   Haydee Monica, PT, DPT 11/09/19 12:33 PM  [facility] [address] [place], [place], [address] Phone: [phone]   Fax:  [phone]  Name: Renee Collier MRN: [id number] Date of Birth: 12-14-1970

## 2019-11-12 ENCOUNTER — Ambulatory Visit: Payer: Self-pay

## 2019-11-19 ENCOUNTER — Ambulatory Visit: Payer: Self-pay

## 2019-11-21 ENCOUNTER — Ambulatory Visit: Payer: Self-pay

## 2019-11-23 ENCOUNTER — Ambulatory Visit: Payer: Self-pay | Admitting: Family

## 2019-11-23 NOTE — Progress Notes (Deleted)
Patient ID: Renee Collier, female    DOB: 1970/08/24  MRN: [id number]  CC: Cramping and discharge  Subjective: Unknown Renee Collier is a 49 y.o. female with history of primary osteoarthritis of left hip and abnormal uterine bleedingwho presents for cramping and discharge.   1. VAGINAL DISCHARGE: Onset:  Description:  Modifying factors:   Symptoms Odor:  Itching:  Vaginal burning:  Dysuria:  Bleeding:  Pelvic pain:  Back pain:  Fever:  Genital sores:  Rash:  Dyspareunia:  GI Symptoms:   Red Flags:  Missed period:  Recent antibiotics:  Possible STD exposure:  IUD:  Diabetes:   Patient Active Problem List   Diagnosis Date Noted  . Primary osteoarthritis of left hip 10/18/2019  . Well woman exam with routine gynecological exam 12/14/2018  . Vaginal discharge 12/14/2018  . Status post HTA endometrial ablation 04/10/2014  . Abnormal uterine bleeding (AUB) 12/18/2013     Current Outpatient Medications on File Prior to Visit  Medication Sig Dispense Refill  . cyclobenzaprine (FLEXERIL) 10 MG tablet 1/2 in the morning and afternoon prn muscle spasm and 1 at bedtime prn (Patient not taking: Reported on 11/09/2019) 30 tablet 0  . gabapentin (NEURONTIN) 300 MG capsule Take 1 capsule (300 mg total) by mouth 3 (three) times daily. Prn nerve pain (Patient not taking: Reported on 11/09/2019) 60 capsule 3  . hydrochlorothiazide (HYDRODIURIL) 25 MG tablet Take 1 tablet (25 mg total) by mouth daily. 30 tablet 6  . metroNIDAZOLE (FLAGYL) 500 MG tablet Take 1 tablet (500 mg total) by mouth 2 (two) times daily. (Patient not taking: Reported on 02/21/2019) 14 tablet 0  . naproxen (NAPROSYN) 500 MG tablet Take 1 tablet (500 mg total) by mouth 2 (two) times daily with a meal. X 10 days then prn pain 60 tablet 0  . valACYclovir (VALTREX) 1000 MG tablet Take 1 tablet (1,000 mg total) by mouth 3 (three) times daily. (Patient not taking: Reported on 11/09/2019) 21 tablet 0  . Vitamin D,  Ergocalciferol, (DRISDOL) 1.25 MG (50000 UT) CAPS capsule Take 1 capsule (50,000 Units total) by mouth every 7 (seven) days. (Patient not taking: Reported on 11/09/2019) 16 capsule 0   No current facility-administered medications on file prior to visit.    No Known Allergies  Social History   Socioeconomic History  . Marital status: Single    Spouse name: Not on file  . Number of children: Not on file  . Years of education: Not on file  . Highest education level: 12th grade  Occupational History  . Not on file  Tobacco Use  . Smoking status: Never Smoker  . Smokeless tobacco: Never Used  Vaping Use  . Vaping Use: Never used  Substance and Sexual Activity  . Alcohol use: No  . Drug use: No  . Sexual activity: Not Currently    Birth control/protection: Surgical  Other Topics Concern  . Not on file  Social History Narrative  . Not on file   Social Determinants of Health   Financial Resource Strain:   . Difficulty of Paying Living Expenses: Not on file  Food Insecurity:   . Worried About Charity fundraiser in the Last Year: Not on file  . Ran Out of Food in the Last Year: Not on file  Transportation Needs: No Transportation Needs  . Lack of Transportation (Medical): No  . Lack of Transportation (Non-Medical): No  Physical Activity:   . Days of Exercise per Week: Not on file  .  Minutes of Exercise per Session: Not on file  Stress:   . Feeling of Stress : Not on file  Social Connections:   . Frequency of Communication with Friends and Family: Not on file  . Frequency of Social Gatherings with Friends and Family: Not on file  . Attends Religious Services: Not on file  . Active Member of Clubs or Organizations: Not on file  . Attends Archivist Meetings: Not on file  . Marital Status: Not on file  Intimate Partner Violence:   . Fear of Current or Ex-Partner: Not on file  . Emotionally Abused: Not on file  . Physically Abused: Not on file  . Sexually Abused:  Not on file    Family History  Problem Relation Age of Onset  . Cancer Mother        cervical  . Ovarian cancer Sister     Past Surgical History:  Procedure Laterality Date  . DILATION AND CURETTAGE OF UTERUS N/A 04/27/2017   Procedure: Ultrasound Guided Dilatation and Curettage;  Surgeon: [name], MD;  Location: [facility];  Service: Gynecology;  Laterality: N/A;  . ENDOMETRIAL ABLATION    . HYSTEROSCOPY N/A 03/12/2014   Procedure: HYSTEROSCOPY WITH HYDROTHERMAL ABLATION;  Surgeon: Osborne Oman, MD;  Location: [facility];  Service: Gynecology;  Laterality: N/A;  . TUBAL LIGATION      ROS: Review of Systems Negative except as stated above  PHYSICAL EXAM: There were no vitals taken for this visit.  Physical Exam  {female adult master:310786} {female adult master:310785}  CMP Latest Ref Rng & Units 12/14/2018 08/16/2017 04/26/2017  Glucose 65 - 99 mg/dL 114(H) 99 98  BUN 6 - 24 mg/dL 9 10 9   Creatinine 0.57 - 1.00 mg/dL 0.97 0.99 0.94  Sodium 134 - 144 mmol/L 145(H) 142 137  Potassium 3.5 - 5.2 mmol/L 3.7 3.8 3.7  Chloride 96 - 106 mmol/L 105 103 99(L)  CO2 20 - 29 mmol/L 24 22 28   Calcium 8.7 - 10.2 mg/dL 9.5 9.4 9.2  Total Protein 6.0 - 8.5 g/dL 7.2 6.9 -  Total Bilirubin 0.0 - 1.2 mg/dL <0.2 0.2 -  Alkaline Phos 39 - 117 IU/L 139(H) 100 -  AST 0 - 40 IU/L 13 15 -  ALT 0 - 32 IU/L 8 7 -   Lipid Panel     Component Value Date/Time   CHOL 159 01/04/2017 1212   TRIG 134 01/04/2017 1212   HDL 55 01/04/2017 1212   CHOLHDL 2.9 01/04/2017 1212   CHOLHDL 2.4 Ratio 11/23/2006 2033   VLDL 20 11/23/2006 2033   LDLCALC 77 01/04/2017 1212    CBC    Component Value Date/Time   WBC 5.8 04/26/2017 1330   RBC 4.67 04/26/2017 1330   HGB 13.9 04/26/2017 1330   HGB 14.2 01/04/2017 1212   HCT 42.8 04/26/2017 1330   HCT 44.2 01/04/2017 1212   PLT 429 (H) 04/26/2017 1330   PLT 386 (H) 01/04/2017 1212   MCV 91.6 04/26/2017 1330   MCV 91 01/04/2017 1212    MCH 29.8 04/26/2017 1330   MCHC 32.5 04/26/2017 1330   RDW 14.1 04/26/2017 1330   RDW 15.4 01/04/2017 1212   LYMPHSABS 3.4 11/24/2015 1820   MONOABS 0.4 11/24/2015 1820   EOSABS 0.1 11/24/2015 1820   BASOSABS 0.0 11/24/2015 1820    ASSESSMENT AND PLAN:  There are no diagnoses linked to this encounter.   Patient was given the opportunity to ask questions.  Patient verbalized understanding of the plan and was able to repeat key elements of the plan.   No orders of the defined types were placed in this encounter.    Requested Prescriptions    No prescriptions requested or ordered in this encounter    No follow-ups on file.  Camillia Herter, NP

## 2019-11-26 ENCOUNTER — Ambulatory Visit: Payer: Self-pay

## 2019-11-28 ENCOUNTER — Ambulatory Visit: Payer: Self-pay

## 2019-12-03 ENCOUNTER — Ambulatory Visit: Payer: Self-pay | Attending: Orthopaedic Surgery

## 2019-12-03 ENCOUNTER — Telehealth: Payer: Self-pay

## 2019-12-03 NOTE — Telephone Encounter (Signed)
PT left a voicemail on patient's home/mobile number enlisted reminding patient of the no show/cancellation policy. Patient has had several cancellations due to being sick but was a "no show" for today's 5:00pm appointment. Pt was asked to please contact the clinic to let us know if she will be able to attend future appointments or not. PT stated that we will hold off before cancelling all future appointments for now but will have to after the 2nd no show, per policy. PT instructed patient to focus on feeling better if she remains ill and to call her doctor for an updated referral once she is ready to return to physical therapy.  Haydee Monica, PT, DPT 12/03/19 5:46 PM

## 2019-12-05 ENCOUNTER — Ambulatory Visit: Payer: Self-pay

## 2019-12-05 ENCOUNTER — Telehealth: Payer: Self-pay

## 2019-12-05 NOTE — Telephone Encounter (Signed)
PT left patient a voicemail to remind her of the no show policy once again and inform her that all future appointments will now be cancelled due to this being her second consecutive "no show" with several cancelled appointments prior to the no shows. PT stated that pt may request new referral from doctor if/when she is ready to return to physical therapy.  Haydee Monica, PT, DPT 12/05/19 6:13 PM

## 2019-12-13 ENCOUNTER — Encounter: Payer: Self-pay | Admitting: Physical Therapy

## 2019-12-20 ENCOUNTER — Ambulatory Visit
Admission: RE | Admit: 2019-12-20 | Discharge: 2019-12-20 | Disposition: A | Discharge status: Home / Self Care | Payer: No Typology Code available for payment source | Source: Ambulatory Visit | Attending: Obstetrics and Gynecology | Admitting: Obstetrics and Gynecology

## 2019-12-20 ENCOUNTER — Ambulatory Visit: Payer: No Typology Code available for payment source | Admitting: *Deleted

## 2019-12-20 ENCOUNTER — Other Ambulatory Visit: Payer: Self-pay

## 2019-12-20 VITALS — BP 142/98 | Wt 211.0 lb

## 2019-12-20 DIAGNOSIS — Z1239 Encounter for other screening for malignant neoplasm of breast: Secondary | ICD-10-CM

## 2019-12-20 DIAGNOSIS — Z1231 Encounter for screening mammogram for malignant neoplasm of breast: Secondary | ICD-10-CM

## 2019-12-20 NOTE — Progress Notes (Addendum)
[name] is a 49 y.o. female who presents to [facility] clinic today with no complaints.    Pap Smear: Pap smear not completed today. Last Pap smear was 12/14/2018 at [facility] clinic and was normal with negative HPV. Per patient has no history of an abnormal Pap smear. Last Pap smear result is available in Epic.   Physical exam: Breasts Breasts symmetrical. No skin abnormalities bilateral breasts. No nipple retraction bilateral breasts. No nipple discharge bilateral breasts. No lymphadenopathy. No lumps palpated bilateral breasts. No complaints of pain or tenderness on exam.       Pelvic/Bimanual Pap is not indicated today per BCCCP guidelines.   Smoking History: Patient has never smoked.   Patient Navigation: Patient education provided. Access to services provided for patient through B[facility]CP program.    Breast and Cervical Cancer Risk Assessment: Patient does not have family history of breast cancer, known genetic mutations, or radiation treatment to the chest before age 82. Patient does not have history of cervical dysplasia, immunocompromised, or DES exposure in-utero.  Risk Assessment    Risk Scores      12/20/2019 12/14/2018   Last edited by: Royston Bake, CMA Renee Collier, Heath Gold, RN   5-year risk: 1 % 0.9 %   Lifetime risk: 9 % 9.2 %          A: BCCCP exam without pap smear No complaints.  P: Referred patient to the [facility] for a screening mammogram on the mobile unit. Appointment scheduled Thursday, December 20, 2019 at 1000.  Loletta Parish, RN 12/20/2019 9:05 AM

## 2019-12-20 NOTE — Patient Instructions (Addendum)
Explained breast self awareness with Renee Collier. Patient did not need a Pap smear today due to last Pap smear and HPV Typing was 12/14/2018. Let her know B[facility]CP will cover Pap smears and HPV typing every 5 years unless has a history of abnormal Pap smears. Referred patient to the [facility] for a screening mammogram on the mobile unit. Appointment scheduled Thursday, December 20, 2019 at 1000. Patient escorted to the mobile unit following B[facility]CP appointment for her screening mammogram. Let patient know the [facility] will follow up with her within the next couple weeks with results of her mammogram by letter or phone. Renee Collier verbalized understanding.  Vaniah Chambers, Arvil Chaco, RN 9:05 AM

## 2019-12-25 ENCOUNTER — Other Ambulatory Visit: Payer: Self-pay | Admitting: Family Medicine

## 2019-12-25 ENCOUNTER — Telehealth: Payer: Self-pay | Admitting: Family Medicine

## 2019-12-25 DIAGNOSIS — I10 Essential (primary) hypertension: Secondary | ICD-10-CM

## 2019-12-25 DIAGNOSIS — R42 Dizziness and giddiness: Secondary | ICD-10-CM

## 2019-12-25 MED ORDER — HYDROCHLOROTHIAZIDE 25 MG PO TABS: 25.0000 mg | ORAL_TABLET | Freq: Every day | ORAL | 0 refills | Status: DC

## 2019-12-25 MED FILL — HYDROCHLOROTHIAZIDE 25 MG T: 25 | 30 days supply | Qty: 30 | Fill #0

## 2019-12-25 NOTE — Telephone Encounter (Signed)
Rx sent 

## 2019-12-25 NOTE — Telephone Encounter (Signed)
1) Medication(s) Requested (by name): hydrochlorothiazide (HYDRODIURIL) 25 MG tablet    2) Pharmacy of Choice: [facility] pharmacy   3) Special Requests: Is completley out of medication.    Approved medications will be sent to the pharmacy, we will reach out if there is an issue.  Requests made after 3pm may not be addressed until the following business day!  If a patient is unsure of the name of the medication(s) please note and ask patient to call back when they are able to provide all info, do not send to responsible party until all information is available!

## 2020-01-01 ENCOUNTER — Encounter: Payer: Self-pay | Admitting: Family Medicine

## 2020-01-01 ENCOUNTER — Other Ambulatory Visit: Payer: Self-pay | Admitting: Family Medicine

## 2020-01-01 ENCOUNTER — Ambulatory Visit: Payer: Self-pay | Attending: Family | Admitting: Family Medicine

## 2020-01-01 ENCOUNTER — Other Ambulatory Visit: Payer: Self-pay

## 2020-01-01 VITALS — BP 138/85 | HR 85 | Ht 63.0 in | Wt 212.0 lb

## 2020-01-01 DIAGNOSIS — N939 Abnormal uterine and vaginal bleeding, unspecified: Secondary | ICD-10-CM

## 2020-01-01 DIAGNOSIS — I10 Essential (primary) hypertension: Secondary | ICD-10-CM

## 2020-01-01 MED ORDER — HYDROCHLOROTHIAZIDE 25 MG PO TABS: 25.0000 mg | ORAL_TABLET | Freq: Every day | ORAL | 6 refills | Status: DC

## 2020-01-01 MED ORDER — FLUCONAZOLE 150 MG PO TABS: 150.0000 mg | ORAL_TABLET | Freq: Once | ORAL | 1 refills | Status: DC

## 2020-01-01 MED FILL — FLUCONAZOLE 150 MG TABLET: 150 | 1 days supply | Qty: 1 | Fill #0

## 2020-01-01 NOTE — Progress Notes (Signed)
Subjective:  Patient ID: Renee Collier, female    DOB: 12-07-1970  Age: 49 y.o. MRN: [id number]  CC: Hypertension   HPI Renee Collier 49 year old female with a history of hypertension seen today for a follow-up visit.  When she went for her screening mammogram, her BP was 142/98 and she was told to follow up here.  At home her blood pressures have been in the 361 systolic.  Sometimes she has L hip pain and still has to work and this aggravates her and she is wondering if this could have resulted in her elevated blood pressure at the mammogram screening unit. She has been bleeding twice a month for the last 3 months.  She has had a history of uterine ablation and in 2016 which was followed by [facility] in 2019.  As a result of frequent menstrual bleeds she has noticed vaginal itching which is absent at this time but when it occurs she has to buy Monistat OTC  Past Medical History:  Diagnosis Date  . Hypertension     Past Surgical History:  Procedure Laterality Date  . DILATION AND CURETTAGE OF UTERUS N/A 04/27/2017   Procedure: Ultrasound Guided Dilatation and Curettage;  Surgeon: [name], MD;  Location: [place];  Service: Gynecology;  Laterality: N/A;  . ENDOMETRIAL ABLATION    . HYSTEROSCOPY N/A 03/12/2014   Procedure: HYSTEROSCOPY WITH HYDROTHERMAL ABLATION;  Surgeon: Osborne Oman, MD;  Location: [place];  Service: Gynecology;  Laterality: N/A;  . TUBAL LIGATION      Family History  Problem Relation Age of Onset  . Cancer Mother        cervical  . Ovarian cancer Sister     No Known Allergies  Outpatient Medications Prior to Visit  Medication Sig Dispense Refill  . naproxen (NAPROSYN) 500 MG tablet Take 1 tablet (500 mg total) by mouth 2 (two) times daily with a meal. X 10 days then prn pain 60 tablet 0  . valACYclovir (VALTREX) 1000 MG tablet Take 1 tablet (1,000 mg total) by mouth 3 (three) times daily. 21 tablet 0  . Vitamin D, Ergocalciferol, (DRISDOL)  1.25 MG (50000 UT) CAPS capsule Take 1 capsule (50,000 Units total) by mouth every 7 (seven) days. 16 capsule 0  . hydrochlorothiazide (HYDRODIURIL) 25 MG tablet Take 1 tablet (25 mg total) by mouth daily. Must have office visit for refills 30 tablet 0  . cyclobenzaprine (FLEXERIL) 10 MG tablet 1/2 in the morning and afternoon prn muscle spasm and 1 at bedtime prn (Patient not taking: Reported on 11/09/2019) 30 tablet 0  . gabapentin (NEURONTIN) 300 MG capsule Take 1 capsule (300 mg total) by mouth 3 (three) times daily. Prn nerve pain (Patient not taking: Reported on 11/09/2019) 60 capsule 3  . metroNIDAZOLE (FLAGYL) 500 MG tablet Take 1 tablet (500 mg total) by mouth 2 (two) times daily. (Patient not taking: Reported on 02/21/2019) 14 tablet 0   No facility-administered medications prior to visit.     ROS Review of Systems  Constitutional: Negative for activity change, appetite change and fatigue.  HENT: Negative for congestion, sinus pressure and sore throat.   Eyes: Negative for visual disturbance.  Respiratory: Negative for cough, chest tightness, shortness of breath and wheezing.   Cardiovascular: Negative for chest pain and palpitations.  Gastrointestinal: Negative for abdominal distention, abdominal pain and constipation.  Endocrine: Negative for polydipsia.  Genitourinary: Positive for menstrual problem. Negative for dysuria and frequency.  Musculoskeletal: Negative for arthralgias and  back pain.  Skin: Negative for rash.  Neurological: Negative for tremors, light-headedness and numbness.  Hematological: Does not bruise/bleed easily.  Psychiatric/Behavioral: Negative for agitation and behavioral problems.    Objective:  BP 138/85   Pulse 85   Ht _0  (1.6 m)   Wt 212 lb (96.2 kg)   SpO2 100%   BMI 37.55 kg/m   BP/Weight 01/01/2020 12/20/2019 9/41/7408  Systolic BP 144 818 -  Diastolic BP 85 98 -  Wt. (Lbs) 212 211 214.2  BMI 37.55 37.38 37.94      Physical  Exam Constitutional:      Appearance: She is well-developed.  Neck:     Vascular: No JVD.  Cardiovascular:     Rate and Rhythm: Normal rate.     Heart sounds: Normal heart sounds. No murmur heard.   Pulmonary:     Effort: Pulmonary effort is normal.     Breath sounds: Normal breath sounds. No wheezing or rales.  Chest:     Chest wall: No tenderness.  Abdominal:     General: Bowel sounds are normal. There is no distension.     Palpations: Abdomen is soft. There is no mass.     Tenderness: There is no abdominal tenderness.  Musculoskeletal:        General: Normal range of motion.     Right lower leg: No edema.     Left lower leg: No edema.  Neurological:     Mental Status: She is alert and oriented to person, place, and time.  Psychiatric:        Mood and Affect: Mood normal.     CMP Latest Ref Rng & Units 12/14/2018 08/16/2017 04/26/2017  Glucose 65 - 99 mg/dL 114(H) 99 98  BUN 6 - 24 mg/dL _1 Creatinine 0.57 - 1.00 mg/dL 0.97 0.99 0.94  Sodium 134 - 144 mmol/L 145(H) 142 137  Potassium 3.5 - 5.2 mmol/L 3.7 3.8 3.7  Chloride 96 - 106 mmol/L 105 103 99(L)  CO2 20 - 29 mmol/L _2 Calcium 8.7 - 10.2 mg/dL 9.5 9.4 9.2  Total Protein 6.0 - 8.5 g/dL 7.2 6.9 -  Total Bilirubin 0.0 - 1.2 mg/dL <0.2 0.2 -  Alkaline Phos 39 - 117 IU/L 139(H) 100 -  AST 0 - 40 IU/L 13 15 -  ALT 0 - 32 IU/L 8 7 -    Lipid Panel     Component Value Date/Time   CHOL 159 01/04/2017 1212   TRIG 134 01/04/2017 1212   HDL 55 01/04/2017 1212   CHOLHDL 2.9 01/04/2017 1212   CHOLHDL 2.4 Ratio 11/23/2006 2033   VLDL 20 11/23/2006 2033   LDLCALC 77 01/04/2017 1212    CBC    Component Value Date/Time   WBC 5.8 04/26/2017 1330   RBC 4.67 04/26/2017 1330   HGB 13.9 04/26/2017 1330   HGB 14.2 01/04/2017 1212   HCT 42.8 04/26/2017 1330   HCT 44.2 01/04/2017 1212   PLT 429 (H) 04/26/2017 1330   PLT 386 (H) 01/04/2017 1212   MCV 91.6 04/26/2017 1330   MCV 91 01/04/2017 1212   MCH 29.8  04/26/2017 1330   MCHC 32.5 04/26/2017 1330   RDW 14.1 04/26/2017 1330   RDW 15.4 01/04/2017 1212   LYMPHSABS 3.4 11/24/2015 1820   MONOABS 0.4 11/24/2015 1820   EOSABS 0.1 11/24/2015 1820   BASOSABS 0.0 11/24/2015 1820    Lab Results  Component Value Date   HGBA1C 5.8 (  H) 12/14/2018    Assessment & Plan:  1. Abnormal uterine bleeding (AUB) Status post uterine ablation and D&C We will refer back to GYN - Lipid panel - CMP14+EGFR - Ambulatory referral to Gynecology - CBC with Differential/Platelet  2. Hypertension, unspecified type Controlled Advised to continue with ambulatory blood pressures and to contact the clinic if blood pressures are elevated Counseled on blood pressure goal of less than 130/80, low-sodium, DASH diet, medication compliance, 150 minutes of moderate intensity exercise per week. Discussed medication compliance, adverse effects. - hydrochlorothiazide (HYDRODIURIL) 25 MG tablet; Take 1 tablet (25 mg total) by mouth daily. Must have office visit for refills  Dispense: 30 tablet; Refill: 6    Meds ordered this encounter  Medications  . fluconazole (DIFLUCAN) 150 MG tablet    Sig: Take 1 tablet (150 mg total) by mouth once for 1 dose.    Dispense:  1 tablet    Refill:  1  . hydrochlorothiazide (HYDRODIURIL) 25 MG tablet    Sig: Take 1 tablet (25 mg total) by mouth daily. Must have office visit for refills    Dispense:  30 tablet    Refill:  6    Follow-up: Return in about 6 months (around 06/30/2020) for Chronic disease management.       Charlott Rakes, MD, FAAFP. [facility] and [place], [place]   01/01/2020, 5:40 PM

## 2020-01-01 NOTE — Patient Instructions (Signed)
Abnormal Uterine Bleeding Abnormal uterine bleeding means bleeding more than usual from your uterus. It can include:  Bleeding between periods.  Bleeding after sex.  Bleeding that is heavier than normal.  Periods that last longer than usual.  Bleeding after you have stopped having your period (menopause). There are many problems that may cause this. You should see a doctor for any kind of bleeding that is not normal. Treatment depends on the cause of the bleeding. Follow these instructions at home:  Watch your condition for any changes.  Do not use tampons, douche, or have sex, if your doctor tells you not to.  Change your pads often.  Get regular well-woman exams. Make sure they include a pelvic exam and cervical cancer screening.  Keep all follow-up visits as told by your doctor. This is important. Contact a doctor if:  The bleeding lasts more than one week.  You feel dizzy at times.  You feel like you are going to throw up (nauseous).  You throw up. Get help right away if:  You pass out.  You have to change pads every hour.  You have belly (abdominal) pain.  You have a fever.  You get sweaty.  You get weak.  You passing large blood clots from your vagina. Summary  Abnormal uterine bleeding means bleeding more than usual from your uterus.  There are many problems that may cause this. You should see a doctor for any kind of bleeding that is not normal.  Treatment depends on the cause of the bleeding. This information is not intended to replace advice given to you by your health care provider. Make sure you discuss any questions you have with your health care provider. Document Revised: 01/13/2016 Document Reviewed: 01/13/2016 Elsevier Patient Education  2020 [employer].

## 2020-01-01 NOTE — Progress Notes (Signed)
Pt BP has been elevated.

## 2020-01-02 ENCOUNTER — Other Ambulatory Visit: Payer: Self-pay | Admitting: Family Medicine

## 2020-01-02 DIAGNOSIS — R7309 Other abnormal glucose: Secondary | ICD-10-CM

## 2020-01-02 LAB — CMP14+EGFR
ALT: 6 IU/L (ref 0–32)
AST: 7 IU/L (ref 0–40)
Albumin/Globulin Ratio: 1.4 (ref 1.2–2.2)
Albumin: 4.3 g/dL (ref 3.8–4.8)
Alkaline Phosphatase: 123 IU/L — ABNORMAL HIGH (ref 44–121)
BUN/Creatinine Ratio: 16 (ref 9–23)
BUN: 12 mg/dL (ref 6–24)
Bilirubin Total: <0.2 mg/dL (ref 0.0–1.2)
CO2: 28 mmol/L (ref 20–29)
Calcium: 9.7 mg/dL (ref 8.7–10.2)
Chloride: 102 mmol/L (ref 96–106)
Creatinine, Ser: 0.74 mg/dL (ref 0.57–1.00)
GFR calc Af Amer: 112 mL/min/{1.73_m2} (ref >59)
GFR calc non Af Amer: 97 mL/min/{1.73_m2} (ref >59)
Globulin, Total: 3 g/dL (ref 1.5–4.5)
Glucose: 114 mg/dL — ABNORMAL HIGH (ref 65–99)
Potassium: 3.8 mmol/L (ref 3.5–5.2)
Sodium: 142 mmol/L (ref 134–144)
Total Protein: 7.3 g/dL (ref 6.0–8.5)

## 2020-01-02 LAB — CBC WITH DIFFERENTIAL/PLATELET
Basophils Absolute: 0.1 10*3/uL (ref 0.0–0.2)
Basos: 1 %
EOS (ABSOLUTE): 0.1 10*3/uL (ref 0.0–0.4)
Eos: 2 %
Hematocrit: 41.1 % (ref 34.0–46.6)
Hemoglobin: 13.7 g/dL (ref 11.1–15.9)
Immature Grans (Abs): 0 10*3/uL (ref 0.0–0.1)
Immature Granulocytes: 0 %
Lymphocytes Absolute: 3.2 10*3/uL — ABNORMAL HIGH (ref 0.7–3.1)
Lymphs: 47 %
MCH: 29.7 pg (ref 26.6–33.0)
MCHC: 33.3 g/dL (ref 31.5–35.7)
MCV: 89 fL (ref 79–97)
Monocytes Absolute: 0.3 10*3/uL (ref 0.1–0.9)
Monocytes: 5 %
Neutrophils Absolute: 3 10*3/uL (ref 1.4–7.0)
Neutrophils: 45 %
Platelets: 397 10*3/uL (ref 150–450)
RBC: 4.62 x10E6/uL (ref 3.77–5.28)
RDW: 13.5 % (ref 11.7–15.4)
WBC: 6.7 10*3/uL (ref 3.4–10.8)

## 2020-01-02 LAB — LIPID PANEL
Chol/HDL Ratio: 3.2 ratio (ref 0.0–4.4)
Cholesterol, Total: 163 mg/dL (ref 100–199)
HDL: 51 mg/dL (ref >39)
LDL Chol Calc (NIH): 87 mg/dL (ref 0–99)
Triglycerides: 142 mg/dL (ref 0–149)
VLDL Cholesterol Cal: 25 mg/dL (ref 5–40)

## 2020-01-04 ENCOUNTER — Telehealth: Payer: Self-pay | Admitting: Family Medicine

## 2020-01-04 LAB — HEMOGLOBIN A1C
Est. average glucose Bld gHb Est-mCnc: 123 mg/dL
Hgb A1c MFr Bld: 5.9 % — ABNORMAL HIGH (ref 4.8–5.6)

## 2020-01-04 LAB — SPECIMEN STATUS REPORT

## 2020-01-04 NOTE — Telephone Encounter (Signed)
Patient is calling to get clarity on her lab results. Patient states that she did not understand results on MyChart.CB- [phone]

## 2020-01-04 NOTE — Telephone Encounter (Signed)
Pt has been sent a mychart message regarding her lab results.

## 2020-01-07 ENCOUNTER — Ambulatory Visit: Payer: No Typology Code available for payment source | Admitting: Family

## 2020-02-06 ENCOUNTER — Ambulatory Visit: Payer: No Typology Code available for payment source | Admitting: Family Medicine

## 2020-02-19 ENCOUNTER — Encounter: Payer: No Typology Code available for payment source | Admitting: Obstetrics & Gynecology

## 2020-03-10 ENCOUNTER — Ambulatory Visit: Payer: No Typology Code available for payment source | Admitting: Obstetrics & Gynecology

## 2020-04-04 ENCOUNTER — Ambulatory Visit (INDEPENDENT_AMBULATORY_CARE_PROVIDER_SITE_OTHER): Payer: BC Managed Care – PPO | Admitting: Obstetrics & Gynecology

## 2020-04-04 ENCOUNTER — Encounter: Payer: Self-pay | Admitting: Obstetrics & Gynecology

## 2020-04-04 ENCOUNTER — Other Ambulatory Visit: Payer: Self-pay

## 2020-04-04 VITALS — BP 154/95 | HR 86 | Ht 64.0 in | Wt 211.2 lb

## 2020-04-04 DIAGNOSIS — D251 Intramural leiomyoma of uterus: Secondary | ICD-10-CM

## 2020-04-04 DIAGNOSIS — N939 Abnormal uterine and vaginal bleeding, unspecified: Secondary | ICD-10-CM | POA: Diagnosis not present

## 2020-04-04 LAB — CBC
Hematocrit: 41.4 % (ref 34.0–46.6)
Hemoglobin: 13.5 g/dL (ref 11.1–15.9)
MCH: 29 pg (ref 26.6–33.0)
MCHC: 32.6 g/dL (ref 31.5–35.7)
MCV: 89 fL (ref 79–97)
Platelets: 371 10*3/uL (ref 150–450)
RBC: 4.66 x10E6/uL (ref 3.77–5.28)
RDW: 14.2 % (ref 11.7–15.4)
WBC: 6.6 10*3/uL (ref 3.4–10.8)

## 2020-04-04 NOTE — Patient Instructions (Signed)
Uterine Fibroids  Uterine fibroids are lumps of tissue (tumors) in the womb (uterus). Fibroids are not cancerous. Most women with this condition do not need treatment. Sometimes, fibroids can make it harder to have children. If this happens, you may need surgery to take out the fibroids. What are the causes? The cause of this condition is not known. What increases the risk?  You are in your 30s or 40s and have not gone through menopause. Menopause is when you have not had a menstrual period for 12 months.  Having a history of fibroids in your family.  You are of African American descent.  You started your period at age 4 or younger.  You have not given birth.  You are overweight or very overweight. What are the signs or symptoms?  Bleeding between menstrual periods.  Heavy bleeding during your menstrual period.  Pain in the area between your hips.  Needing to pee (urinate) right away or more often than usual.  Not being able to have children (infertility).  Not being able to stay pregnant (miscarriage). Many women do not have symptoms.  How is this treated? Treatment may include:  Follow-up visits with your doctor to check your fibroids for any changes.  Medicines to help with pain, such as aspirin or ibuprofen.  Hormone therapy. This may be given as a pill, in a shot, or with a type of birth control device called an IUD.  Surgery that would do one of these things: ? Take out the fibroids. This may be done if you want to become pregnant. ? Take out the womb (hysterectomy). ? Stop the blood flow to the fibroids. Follow these instructions at home: Medicines  Take over-the-counter and prescription medicines only as told by your doctor.  Ask your doctor if you should: ? Take iron pills. ? Eat more foods that have a lot of iron in them, such as dark green, leafy vegetables. Managing pain If told, put heat on your back or belly. Do this as often as told by your  doctor. Use the heat source that your doctor recommends, such as a moist heat pack or a heating pad. To do this:  Put a towel between your skin and the heat pack or pad.  Leave the heat on for 20-30 minutes.  Take off the heat if your skin turns bright red. This is very important. If you cannot feel pain, heat, or cold, you may have a greater risk of getting burned.   General instructions  Tell your doctor about any changes to your menstrual period, such as: ? Heavy bleeding that needs a change of tampons or pads more than normal. ? A change in how many days your period lasts. ? A change in symptoms that come with your period. This might be belly cramps or back pain.  Keep all follow-up visits. Contact a doctor if:  You have pain that does not get better with medicine or heat. This may include pain or cramps in: ? The area between your hip bones. ? Your back. ? Your belly.  You have new bleeding between your periods.  You have more bleeding during or between your periods.  You feel very tired or weak.  You feel dizzy. Get help right away if:  You faint.  You have pain in the area between your hip bones that gets worse.  You have bleeding that soaks a tampon or pad in 30 minutes or less. Summary  Uterine fibroids are lumps of  tissue (tumors) in your womb. They are not cancerous.  Medicines such as aspirin or ibuprofen may be used to help with pain.  Contact a doctor if you have pain or cramps that do not get better with medicine.  Know the symptoms for when you should get help right away. This information is not intended to replace advice given to you by your health care provider. Make sure you discuss any questions you have with your health care provider. Document Revised: 08/21/2019 Document Reviewed: 08/21/2019 Elsevier Patient Education  2021 Elsevier Inc.  

## 2020-04-04 NOTE — Progress Notes (Signed)
Patient ID: Renee Collier, female   DOB: 09-23-1970, 50 y.o.   MRN: [id number]  Chief Complaint  Patient presents with  . Vaginal Bleeding    HPI Renee Collier is a 50 y.o. female.  [id number] Patient's last menstrual period was 03/20/2020. Patient has a h/o ablation, D&C and uterine fibroids with increasing heavy menses and more frequent bleeding. Menses are twice a month and last up to 7 days. She was to possibly have a TVH 2 years ago and she received Lupron but she did not f/u with Dr. Idolina Collier HPI  Past Medical History:  Diagnosis Date  . Hypertension     Past Surgical History:  Procedure Laterality Date  . DILATION AND CURETTAGE OF UTERUS N/A 04/27/2017   Procedure: Ultrasound Guided Dilatation and Curettage;  Surgeon: [name], MD;  Location: [place];  Service: Gynecology;  Laterality: N/A;  . ENDOMETRIAL ABLATION    . HYSTEROSCOPY N/A 03/12/2014   Procedure: HYSTEROSCOPY WITH HYDROTHERMAL ABLATION;  Surgeon: Osborne Oman, MD;  Location: [facility] [place] [facility];  Service: Gynecology;  Laterality: N/A;  . TUBAL LIGATION      Family History  Problem Relation Age of Onset  . Cancer Mother        cervical  . Ovarian cancer Sister     Social History Social History   Tobacco Use  . Smoking status: Never Smoker  . Smokeless tobacco: Never Used  Vaping Use  . Vaping Use: Never used  Substance Use Topics  . Alcohol use: Yes    Comment: occ  . Drug use: No    No Known Allergies  Current Outpatient Medications  Medication Sig Dispense Refill  . hydrochlorothiazide (HYDRODIURIL) 25 MG tablet Take 1 tablet (25 mg total) by mouth daily. Must have office visit for refills 30 tablet 6  . cyclobenzaprine (FLEXERIL) 10 MG tablet 1/2 in the morning and afternoon prn muscle spasm and 1 at bedtime prn 30 tablet 0  . gabapentin (NEURONTIN) 300 MG capsule Take 1 capsule (300 mg total) by mouth 3 (three) times daily. Prn nerve pain 60 capsule 3  . metroNIDAZOLE (FLAGYL) 500 MG  tablet Take 1 tablet (500 mg total) by mouth 2 (two) times daily. 14 tablet 0  . naproxen (NAPROSYN) 500 MG tablet Take 1 tablet (500 mg total) by mouth 2 (two) times daily with a meal. X 10 days then prn pain 60 tablet 0  . valACYclovir (VALTREX) 1000 MG tablet Take 1 tablet (1,000 mg total) by mouth 3 (three) times daily. 21 tablet 0  . Vitamin D, Ergocalciferol, (DRISDOL) 1.25 MG (50000 UT) CAPS capsule Take 1 capsule (50,000 Units total) by mouth every 7 (seven) days. 16 capsule 0   No current facility-administered medications for this visit.    Review of Systems Review of Systems  Constitutional: Negative.   Respiratory: Negative.   Gastrointestinal: Negative.   Genitourinary: Positive for menstrual problem and pelvic pain. Negative for vaginal bleeding and vaginal discharge.    Blood pressure (!) 154/95, pulse 86, height 5\' 4"  (1.626 m), weight 211 lb 3.2 oz (95.8 kg), last menstrual period 03/20/2020.  Physical Exam Physical Exam Vitals and nursing note reviewed. Exam conducted with a chaperone present.  Constitutional:      Appearance: She is not ill-appearing.  Pulmonary:     Effort: Pulmonary effort is normal.  Abdominal:     General: There is no distension.     Palpations: There is no mass.  Genitourinary:  General: Normal vulva.     Exam position: Lithotomy position.     Vagina: Normal.     Cervix: Normal.     Uterus: Enlarged (6 week size ).      Adnexa: Right adnexa normal and left adnexa normal.  Skin:    General: Skin is warm and dry.  Neurological:     General: No focal deficit present.     Mental Status: She is alert.  Psychiatric:        Mood and Affect: Mood normal.        Behavior: Behavior normal.     Data Reviewed Narrative & Impression  CLINICAL DATA:  Pelvic pain over the last week.  EXAM: TRANSABDOMINAL AND TRANSVAGINAL ULTRASOUND OF PELVIS  DOPPLER ULTRASOUND OF OVARIES  TECHNIQUE: Both transabdominal and transvaginal ultrasound  examinations of the pelvis were performed. Transabdominal technique was performed for global imaging of the pelvis including uterus, ovaries, adnexal regions, and pelvic cul-de-sac.  It was necessary to proceed with endovaginal exam following the transabdominal exam to visualize the uterus and ovaries more clearly. Color and duplex Doppler ultrasound was utilized to evaluate blood flow to the ovaries.  COMPARISON:  04/27/2017.  03/03/2017.  12/18/2013.  FINDINGS: Uterus  Measurements: 8.2 x 5.1 x 5.8 cm. Multiple small leiomyomas. Largest measures 2 cm near the fundus.  Endometrium  Thickness: 11 mm.  No focal abnormality visualized.  Right ovary  Measurements: 4.0 x 3.8 x 3.8 cm. Two adjacent cysts, measuring to gather 3.9 x 2.7 x 2.5 cm. These could be functional cysts, endometriomas or tubo-ovarian abscess.  Left ovary  Measurements: 2.6 x 1.8 x 1.7 cm. Normal appearance/no adnexal mass.  Pulsed Doppler evaluation of both ovaries demonstrates normal low-resistance arterial and venous waveforms.  Other findings  No abnormal free fluid.  IMPRESSION: Several small leiomyomas of the uterus.  Two adjacent cystic areas associated with the right ovary measuring together 3.9 x 2.7 x 2.5 cm. These are nonspecific and could represent functional cysts, endometrioma or tubo-ovarian abscess.   Electronically Signed   By: Nelson Chimes M.D.   On: 10/16/2017 20:25    CBC    Component Value Date/Time   WBC 6.7 01/01/2020 1414   WBC 5.8 04/26/2017 1330   RBC 4.62 01/01/2020 1414   RBC 4.67 04/26/2017 1330   HGB 13.7 01/01/2020 1414   HCT 41.1 01/01/2020 1414   PLT 397 01/01/2020 1414   MCV 89 01/01/2020 1414   MCH 29.7 01/01/2020 1414   MCH 29.8 04/26/2017 1330   MCHC 33.3 01/01/2020 1414   MCHC 32.5 04/26/2017 1330   RDW 13.5 01/01/2020 1414   LYMPHSABS 3.2 (H) 01/01/2020 1414   MONOABS 0.4 11/24/2015 1820   EOSABS 0.1 01/01/2020 1414    BASOSABS 0.1 01/01/2020 1414     Assessment Abnormal uterine bleeding (AUB) - Plan: US PELVIC COMPLETE WITH TRANSVAGINAL, CBC  Intramural leiomyoma of uterus - Plan: US PELVIC COMPLETE WITH TRANSVAGINAL    Plan Orders Placed This Encounter  Procedures  . US PELVIC COMPLETE WITH TRANSVAGINAL  . CBC   RTC after [place] and CBC    Emeterio Reeve 04/04/2020, 10:36 AM

## 2020-04-04 NOTE — Progress Notes (Signed)
History of Ablation and D&C. Had 2 periods in February, has been having 2 a month since September . States they are heavy periods- wearing 2-3 pads, changing often.  BP elevated today at 153/105. States has appointment with PCP in April.  Lear Carstens,RN

## 2020-04-07 ENCOUNTER — Ambulatory Visit: Payer: No Typology Code available for payment source | Admitting: Obstetrics and Gynecology

## 2020-04-07 ENCOUNTER — Other Ambulatory Visit: Payer: Self-pay | Admitting: Physician Assistant

## 2020-04-07 ENCOUNTER — Other Ambulatory Visit: Payer: Self-pay | Admitting: Family Medicine

## 2020-04-07 DIAGNOSIS — M25552 Pain in left hip: Secondary | ICD-10-CM

## 2020-04-07 DIAGNOSIS — M79605 Pain in left leg: Secondary | ICD-10-CM

## 2020-04-07 NOTE — Telephone Encounter (Signed)
   Notes to clinic: medication was filled back in 08/2019 and was for a 10 day supply Review for continue use and refill   Requested Prescriptions  Pending Prescriptions Disp Refills   naproxen (NAPROSYN) 500 MG tablet [Pharmacy Med Name: NAPROXEN 500 MG TABLET 500 Tablet] 60 tablet 0    Sig: Take 1 tablet (500 mg total) by mouth 2 (two) times daily with a meal. X 10 days then prn pain      Analgesics:  NSAIDS Passed - 04/07/2020 11:01 AM      Passed - Cr in normal range and within 360 days    Creatinine, Ser  Date Value Ref Range Status  01/01/2020 0.74 0.57 - 1.00 mg/dL Final          Passed - HGB in normal range and within 360 days    Hemoglobin  Date Value Ref Range Status  04/04/2020 13.5 11.1 - 15.9 g/dL Final          Passed - Patient is not pregnant      Passed - Valid encounter within last 12 months    Recent Outpatient Visits           3 months ago Abnormal uterine bleeding (AUB)   [place], Charlane Ferretti, MD   7 months ago Left hip pain   [place], [place], [place]   1 year ago Herpes zoster without complication   [place], [place], [place]   1 year ago Viral syndrome   [place], Charlane Ferretti, MD   1 year ago [place], [place], [place]

## 2020-04-16 ENCOUNTER — Ambulatory Visit
Admission: RE | Admit: 2020-04-16 | Discharge: 2020-04-16 | Disposition: A | Discharge status: Home / Self Care | Payer: BC Managed Care – PPO | Source: Ambulatory Visit | Attending: Obstetrics & Gynecology | Admitting: Obstetrics & Gynecology

## 2020-04-16 ENCOUNTER — Other Ambulatory Visit: Payer: Self-pay

## 2020-04-16 DIAGNOSIS — N938 Other specified abnormal uterine and vaginal bleeding: Secondary | ICD-10-CM | POA: Diagnosis not present

## 2020-04-16 DIAGNOSIS — D251 Intramural leiomyoma of uterus: Secondary | ICD-10-CM | POA: Insufficient documentation

## 2020-04-16 DIAGNOSIS — Z9851 Tubal ligation status: Secondary | ICD-10-CM | POA: Diagnosis not present

## 2020-04-16 DIAGNOSIS — D252 Subserosal leiomyoma of uterus: Secondary | ICD-10-CM | POA: Diagnosis not present

## 2020-04-16 DIAGNOSIS — D250 Submucous leiomyoma of uterus: Secondary | ICD-10-CM | POA: Diagnosis not present

## 2020-04-16 DIAGNOSIS — N939 Abnormal uterine and vaginal bleeding, unspecified: Secondary | ICD-10-CM | POA: Insufficient documentation

## 2020-04-21 MED FILL — HYDROCHLOROTHIAZIDE 25 MG T: 25 | 30 days supply | Qty: 30 | Fill #0

## 2020-04-21 MED FILL — NAPROXEN 500 MG TABLET: 500 | 30 days supply | Qty: 60 | Fill #0

## 2020-05-05 ENCOUNTER — Other Ambulatory Visit: Payer: Self-pay

## 2020-05-05 ENCOUNTER — Ambulatory Visit (INDEPENDENT_AMBULATORY_CARE_PROVIDER_SITE_OTHER): Payer: BC Managed Care – PPO | Admitting: Obstetrics & Gynecology

## 2020-05-05 ENCOUNTER — Encounter: Payer: Self-pay | Admitting: Obstetrics & Gynecology

## 2020-05-05 VITALS — BP 135/79 | HR 88 | Ht 63.0 in | Wt 212.5 lb

## 2020-05-05 DIAGNOSIS — N939 Abnormal uterine and vaginal bleeding, unspecified: Secondary | ICD-10-CM | POA: Diagnosis not present

## 2020-05-05 NOTE — Progress Notes (Signed)
Patient ID: Renee Collier, female   DOB: November 01, 1970, 50 y.o.   MRN: [id number]  Chief Complaint  Patient presents with  . Follow-up    HPI Renee Collier is a 50 y.o. female.  [id number] No LMP recorded. (Menstrual status: Irregular Periods). She returns to schedule TVH after having [place] for pelvic anatomy. Her menses are irregular and heavy. She has to leave work due to heavy bleeding. HPI  Past Medical History:  Diagnosis Date  . Hypertension     Past Surgical History:  Procedure Laterality Date  . DILATION AND CURETTAGE OF UTERUS N/A 04/27/2017   Procedure: Ultrasound Guided Dilatation and Curettage;  Surgeon: [name], MD;  Location: [facility];  Service: Gynecology;  Laterality: N/A;  . ENDOMETRIAL ABLATION    . HYSTEROSCOPY N/A 03/12/2014   Procedure: HYSTEROSCOPY WITH HYDROTHERMAL ABLATION;  Surgeon: Osborne Oman, MD;  Location: [facility];  Service: Gynecology;  Laterality: N/A;  . TUBAL LIGATION      Family History  Problem Relation Age of Onset  . Cancer Mother        cervical  . Ovarian cancer Sister     Social History Social History   Tobacco Use  . Smoking status: Never Smoker  . Smokeless tobacco: Never Used  Vaping Use  . Vaping Use: Never used  Substance Use Topics  . Alcohol use: Yes    Comment: occ  . Drug use: No    No Known Allergies  Current Outpatient Medications  Medication Sig Dispense Refill  . hydrochlorothiazide (HYDRODIURIL) 25 MG tablet TAKE 1 TABLET (25 MG TOTAL) BY MOUTH DAILY. MUST HAVE OFFICE VISIT FOR REFILLS 30 tablet 6  . metroNIDAZOLE (FLAGYL) 500 MG tablet Take 1 tablet (500 mg total) by mouth 2 (two) times daily. 14 tablet 0  . naproxen (NAPROSYN) 500 MG tablet TAKE 1 TABLET (500 MG TOTAL) BY MOUTH 2 (TWO) TIMES DAILY WITH A MEAL. X 10 DAYS THEN PRN PAIN 60 tablet 0  . valACYclovir (VALTREX) 1000 MG tablet Take 1 tablet (1,000 mg total) by mouth 3 (three) times daily. 21 tablet 0  . cyclobenzaprine (FLEXERIL) 10  MG tablet 1/2 in the morning and afternoon prn muscle spasm and 1 at bedtime prn (Patient not taking: Reported on 05/05/2020) 30 tablet 0  . gabapentin (NEURONTIN) 300 MG capsule Take 1 capsule (300 mg total) by mouth 3 (three) times daily. Prn nerve pain (Patient not taking: Reported on 05/05/2020) 60 capsule 3  . Vitamin D, Ergocalciferol, (DRISDOL) 1.25 MG (50000 UT) CAPS capsule Take 1 capsule (50,000 Units total) by mouth every 7 (seven) days. (Patient not taking: Reported on 05/05/2020) 16 capsule 0   No current facility-administered medications for this visit.    Review of Systems Review of Systems  Constitutional: Negative.   Respiratory: Negative.   Gastrointestinal: Negative.   Genitourinary: Positive for menstrual problem. Negative for vaginal bleeding and vaginal discharge.    Blood pressure 135/79, pulse 88, height 5\' 3"  (1.6 m), weight 212 lb 8 oz (96.4 kg).  Physical Exam Physical Exam Vitals and nursing note reviewed.  Constitutional:      Appearance: She is obese.  Pulmonary:     Effort: Pulmonary effort is normal.  Neurological:     Mental Status: She is alert.  Psychiatric:        Mood and Affect: Mood normal.        Behavior: Behavior normal.     Data Reviewed Narrative & Impression  CLINICAL DATA:  Dysfunctional uterine bleeding, history of uterine fibroids; LMP 04/11/2020; G3P3; history tubal ligation, endometrial ablation  EXAM: TRANSABDOMINAL AND TRANSVAGINAL ULTRASOUND OF PELVIS  TECHNIQUE: Both transabdominal and transvaginal ultrasound examinations of the pelvis were performed. Transabdominal technique was performed for global imaging of the pelvis including uterus, ovaries, adnexal regions, and pelvic cul-de-sac. It was necessary to proceed with endovaginal exam following the transabdominal exam to visualize the endometrium.  COMPARISON:  10/16/2017  FINDINGS: Uterus  Measurements: 8.1 x 4.9 x 5.4 cm = volume: 112 mL.  Anteverted. Heterogeneous myometrium. Multiple uterine masses consistent with leiomyomata. Submucosal leiomyoma at posterior mid uterus 3.6 cm diameter. Additional leiomyomata are seen at RIGHT fundus intramural 3.2 cm and posterior LEFT uterus 3.1 cm subserosal.  Endometrium  Thickness: 8 mm.  No definite endometrial fluid or or mass  Right ovary  Measurements: 2.8 x 1.1 x 2.1 cm = volume: 3.3 mL. Normal morphology without mass  Left ovary  Measurements: 2.4 x 1.3 x 1.2 cm = volume: 1.9 mL. Suboptimally visualized on transabdominal imaging due to position, uterus, and bowel. No gross abnormalities.  Other findings  No free pelvic fluid.  No adnexal masses.  IMPRESSION: Multiple uterine leiomyomata, including a 3.6 cm diameter submucosal leiomyoma at posterior mid uterus.  Unremarkable endometrial complex and ovaries.   Electronically Signed   By: Lavonia Dana M.D.   On: 04/16/2020 11:20   CBC    Component Value Date/Time   WBC 6.6 04/04/2020 1054   WBC 5.8 04/26/2017 1330   RBC 4.66 04/04/2020 1054   RBC 4.67 04/26/2017 1330   HGB 13.5 04/04/2020 1054   HCT 41.4 04/04/2020 1054   PLT 371 04/04/2020 1054   MCV 89 04/04/2020 1054   MCH 29.0 04/04/2020 1054   MCH 29.8 04/26/2017 1330   MCHC 32.6 04/04/2020 1054   MCHC 32.5 04/26/2017 1330   RDW 14.2 04/04/2020 1054   LYMPHSABS 3.2 (H) 01/01/2020 1414   MONOABS 0.4 11/24/2015 1820   EOSABS 0.1 01/01/2020 1414   BASOSABS 0.1 01/01/2020 1414     Assessment DUB, uterine fibroids, menorrhagia Benign endometrium at [facility] 2019 Request TVH  Plan TVH and BS discussed. Patient desires surgical management   The risks of surgery were discussed in detail with the patient including but not limited to: bleeding which may require transfusion or reoperation; infection which may require prolonged hospitalization or re-hospitalization and antibiotic therapy; injury to bowel, bladder, ureters and major vessels or  other surrounding organs; formation of adhesions; need for additional procedures including laparotomy or subsequent procedures secondary to abnormal pathology; thromboembolic phenomenon; incisional problems and other postoperative or anesthesia complications.  Patient was told that the likelihood that her condition and symptoms will be treated effectively with this surgical management was very high; the postoperative expectations were also discussed in detail. The patient also understands the alternative treatment options which were discussed in full. All questions were answered.  She was told that she will be contacted by our surgical scheduler regarding the time and date of her surgery; routine preoperative instructions will be given to her by the preoperative nursing team.   She is aware of need for preoperative COVID testing and subsequent quarantine from time of test to time of surgery; she will be given further preoperative instructions at that [facility] screening visit.  Printed patient education handouts about the procedure were given to the patient to review at home.    Emeterio Reeve 05/05/2020, 1:32 PM

## 2020-05-05 NOTE — Patient Instructions (Signed)
Vaginal Hysterectomy  A vaginal hysterectomy is a procedure to remove all or part of the uterus through a small incision in the vagina. In this procedure, your health care provider may remove your entire uterus, including the cervix. The cervix is the opening and bottom part of the uterus and is located between the vagina and the uterus. Sometimes, the ovaries and fallopian tubes are also removed. This surgery may be done to treat problems such as:  Noncancerous growths in the uterus (uterine fibroids) that cause symptoms.  A condition that causes the lining of the uterus to grow in other areas (endometriosis).  Problems with pelvic support.  Cancer of the cervix, ovaries, uterus, or tissue that lines the uterus (endometrium).  Excessive bleeding in the uterus. When removing your uterus, your health care provider may also remove the ovaries and the fallopian tubes. After this procedure, you will no longer be able to have a baby, and you will no longer have a menstrual period. Tell a health care provider about:  Any allergies you have.  All medicines you are taking, including vitamins, herbs, eye drops, creams, and over-the-counter medicines.  Any problems you or family members have had with anesthetic medicines.  Any blood disorders you have.  Any surgeries you have had.  Any medical conditions you have.  Whether you are pregnant or may be pregnant. What are the risks? Generally, this is a safe procedure. However, problems may occur, including:  Bleeding.  Infection.  Blood clots in the legs or lungs.  Damage to nearby structures or organs.  Pain during sex.  Allergic reactions to medicines. What happens before the procedure? Staying hydrated Follow instructions from your health care provider about hydration, which may include:  Up to 2 hours before the procedure - you may continue to drink clear liquids, such as water, clear fruit juice, black coffee, and plain tea.    Eating and drinking restrictions Follow instructions from your health care provider about eating and drinking, which may include:  8 hours before the procedure - stop eating heavy meals or foods, such as meat, fried foods, or fatty foods.  6 hours before the procedure - stop eating light meals or foods, such as toast or cereal.  6 hours before the procedure - stop drinking milk or drinks that contain milk.  2 hours before the procedure - stop drinking clear liquids. Medicines  Ask your health care provider about: ? Changing or stopping your regular medicines. This is especially important if you are taking diabetes medicines or blood thinners. ? Taking medicines such as aspirin and ibuprofen. These medicines can thin your blood. Do not take these medicines unless your health care provider tells you to take them. ? Taking over-the-counter medicines, vitamins, herbs, and supplements.  You may be asked to take a medicine to empty your colon (bowel preparation). General instructions  If you were asked to do a bowel preparation before the procedure, follow instructions from your health care provider.  This procedure can affect the way you feel about yourself. Talk with your health care provider about the physical and emotional changes hysterectomy may cause.  Do not use any products that contain nicotine or tobacco for at least 4 weeks before the procedure. These products include cigarettes, e-cigarettes, and chewing tobacco. If you need help quitting, ask your health care provider.  Plan to have a responsible adult take you home from the hospital or clinic.  Plan to have a responsible adult care for you for  the time you are told after you leave the hospital or clinic. This is important. Surgery safety Ask your health care provider:  How your surgery site will be marked.  What steps will be taken to help prevent infection. These may include: ? Removing hair at the surgery  site. ? Washing skin with a germ-killing soap. ? Receiving antibiotic medicine. What happens during the procedure?  An IV will be inserted into one of your veins.  You will be given one or more of the following: ? A medicine to help you relax (sedative). ? A medicine to numb the area (local anesthetic). ? A medicine to make you fall asleep (general anesthetic). ? A medicine that is injected into your spine to numb the area below and slightly above the injection site (spinal anesthetic). ? A medicine that is injected into an area of your body to numb everything below the injection site (regional anesthetic).  Your surgeon will make an incision in your vagina.  Your surgeon will locate and remove all or part of your uterus. Part or all of the uterus will be removed through the vagina.  Your ovaries and fallopian tubes may be removed at the same time.  The incision in your vagina will be closed with stitches (sutures) that dissolve over time. The procedure may vary among health care providers and hospitals. What happens after the procedure?  Your blood pressure, heart rate, breathing rate, and blood oxygen level will be monitored until you leave the hospital or clinic.  You will be encouraged to walk as soon as possible. You will also use a device or do breathing exercises to keep your lungs clear.  You may have to wear compression stockings. These stockings help to prevent blood clots and reduce swelling in your legs.  You will be given pain medicine as needed.  You will need to wear a sanitary pad for vaginal discharge or bleeding. Summary  A vaginal hysterectomy is a procedure to remove all or part of the uterus through the vagina.  You may need a vaginal hysterectomy to treat a variety of abnormalities of the uterus.  Plan to have a responsible adult take you home from the hospital or clinic.  Plan to have a responsible adult care for you for the time you are told after you  leave the hospital or clinic. This is important. This information is not intended to replace advice given to you by your health care provider. Make sure you discuss any questions you have with your health care provider. Document Revised: 09/21/2019 Document Reviewed: 09/21/2019 Elsevier Patient Education  [employer].

## 2020-05-06 ENCOUNTER — Encounter: Payer: Self-pay | Admitting: *Deleted

## 2020-06-06 ENCOUNTER — Other Ambulatory Visit (HOSPITAL_COMMUNITY)
Admission: RE | Admit: 2020-06-06 | Discharge: 2020-06-06 | Disposition: A | Discharge status: Home / Self Care | Payer: BC Managed Care – PPO | Source: Ambulatory Visit | Attending: Obstetrics & Gynecology | Admitting: Obstetrics & Gynecology

## 2020-06-06 DIAGNOSIS — Z01812 Encounter for preprocedural laboratory examination: Secondary | ICD-10-CM | POA: Insufficient documentation

## 2020-06-06 DIAGNOSIS — Z20822 Contact with and (suspected) exposure to covid-19: Secondary | ICD-10-CM | POA: Insufficient documentation

## 2020-06-07 LAB — SARS CORONAVIRUS 2 (TAT 6-24 HRS): SARS Coronavirus 2: NEGATIVE

## 2020-06-09 ENCOUNTER — Encounter (HOSPITAL_COMMUNITY): Payer: Self-pay | Admitting: Obstetrics & Gynecology

## 2020-06-09 NOTE — Progress Notes (Signed)
Spoke with pt for pre-op call. Pt denies cardiac history, Pre-diabetes or Diabetes. Pt is treated for HTN.  Covid test done 06/06/20 and it's negative. Pt states she's been in quarantine since the test was done and understands that she stays in quarantine until she comes to the hospital tomorrow.

## 2020-06-09 NOTE — Anesthesia Preprocedure Evaluation (Addendum)
Anesthesia Evaluation  Patient identified by MRN, date of birth, ID band Patient awake    Reviewed: Allergy & Precautions, NPO status , Patient's Chart, lab work & pertinent test results  Airway Mallampati: II  TM Distance: >3 FB Neck ROM: Full    Dental no notable dental hx. (+) Teeth Intact, Dental Advisory Given   Pulmonary neg pulmonary ROS,    Pulmonary exam normal breath sounds clear to auscultation       Cardiovascular hypertension, Pt. on medications Normal cardiovascular exam Rhythm:Regular Rate:Normal  12 lead EKG non-specific ST-T wave changes   Neuro/Psych negative neurological ROS  negative psych ROS   GI/Hepatic negative GI ROS, Neg liver ROS,   Endo/Other  negative endocrine ROSObesity  Renal/GU negative Renal ROS  negative genitourinary   Musculoskeletal negative musculoskeletal ROS (+) Arthritis , Osteoarthritis,    Abdominal (+) + obese,   Peds negative pediatric ROS (+)  Hematology negative hematology ROS (+) Blood dyscrasia, anemia ,   Anesthesia Other Findings   Reproductive/Obstetrics negative OB ROS AUB                            Anesthesia Physical  Anesthesia Plan  ASA: II  Anesthesia Plan: General   Post-op Pain Management:    Induction: Intravenous  PONV Risk Score and Plan: 3 and Ondansetron, Dexamethasone and Midazolam  Airway Management Planned: LMA and Oral ETT  Additional Equipment: None  Intra-op Plan:   Post-operative Plan: Extubation in OR  Informed Consent: I have reviewed the patients History and Physical, chart, labs and discussed the procedure including the risks, benefits and alternatives for the proposed anesthesia with the patient or authorized representative who has indicated his/her understanding and acceptance.     Dental advisory given  Plan Discussed with: CRNA and Anesthesiologist  Anesthesia Plan Comments:          Anesthesia Quick Evaluation

## 2020-06-10 ENCOUNTER — Ambulatory Visit (HOSPITAL_COMMUNITY): Payer: BC Managed Care – PPO | Admitting: Certified Registered Nurse Anesthetist

## 2020-06-10 ENCOUNTER — Other Ambulatory Visit: Payer: Self-pay

## 2020-06-10 ENCOUNTER — Observation Stay (HOSPITAL_COMMUNITY)
Admission: RE | Admit: 2020-06-10 | Discharge: 2020-06-11 | Disposition: A | Discharge status: Home / Self Care | Payer: BC Managed Care – PPO | Attending: Obstetrics & Gynecology | Admitting: Obstetrics & Gynecology

## 2020-06-10 ENCOUNTER — Other Ambulatory Visit: Payer: Self-pay | Admitting: Obstetrics & Gynecology

## 2020-06-10 ENCOUNTER — Encounter (HOSPITAL_COMMUNITY)
Admission: RE | Disposition: A | Discharge status: Home / Self Care | Payer: Self-pay | Source: Home / Self Care | Attending: Obstetrics & Gynecology

## 2020-06-10 ENCOUNTER — Encounter (HOSPITAL_COMMUNITY): Payer: Self-pay | Admitting: Obstetrics & Gynecology

## 2020-06-10 DIAGNOSIS — D259 Leiomyoma of uterus, unspecified: Secondary | ICD-10-CM | POA: Diagnosis not present

## 2020-06-10 DIAGNOSIS — M1612 Unilateral primary osteoarthritis, left hip: Secondary | ICD-10-CM | POA: Diagnosis not present

## 2020-06-10 DIAGNOSIS — N939 Abnormal uterine and vaginal bleeding, unspecified: Secondary | ICD-10-CM

## 2020-06-10 DIAGNOSIS — N800 Endometriosis of uterus: Secondary | ICD-10-CM | POA: Insufficient documentation

## 2020-06-10 DIAGNOSIS — I10 Essential (primary) hypertension: Secondary | ICD-10-CM | POA: Diagnosis not present

## 2020-06-10 DIAGNOSIS — N839 Noninflammatory disorder of ovary, fallopian tube and broad ligament, unspecified: Secondary | ICD-10-CM | POA: Diagnosis not present

## 2020-06-10 DIAGNOSIS — N921 Excessive and frequent menstruation with irregular cycle: Secondary | ICD-10-CM

## 2020-06-10 DIAGNOSIS — N879 Dysplasia of cervix uteri, unspecified: Secondary | ICD-10-CM | POA: Diagnosis not present

## 2020-06-10 DIAGNOSIS — Z79899 Other long term (current) drug therapy: Secondary | ICD-10-CM | POA: Insufficient documentation

## 2020-06-10 HISTORY — PX: VAGINAL HYSTERECTOMY: SHX2639

## 2020-06-10 LAB — BASIC METABOLIC PANEL
Anion gap: 8 (ref 5–15)
BUN: 11 mg/dL (ref 6–20)
CO2: 28 mmol/L (ref 22–32)
Calcium: 9 mg/dL (ref 8.9–10.3)
Chloride: 102 mmol/L (ref 98–111)
Creatinine, Ser: 0.81 mg/dL (ref 0.44–1.00)
GFR, Estimated: >60 mL/min (ref >60)
Glucose, Bld: 102 mg/dL — ABNORMAL HIGH (ref 70–99)
Potassium: 3.2 mmol/L — ABNORMAL LOW (ref 3.5–5.1)
Sodium: 138 mmol/L (ref 135–145)

## 2020-06-10 LAB — ABO/RH: ABO/RH(D): O POS

## 2020-06-10 LAB — CBC
HCT: 40.7 % (ref 36.0–46.0)
Hemoglobin: 12.9 g/dL (ref 12.0–15.0)
MCH: 29.3 pg (ref 26.0–34.0)
MCHC: 31.7 g/dL (ref 30.0–36.0)
MCV: 92.5 fL (ref 80.0–100.0)
Platelets: 405 10*3/uL — ABNORMAL HIGH (ref 150–400)
RBC: 4.4 MIL/uL (ref 3.87–5.11)
RDW: 14 % (ref 11.5–15.5)
WBC: 6.8 10*3/uL (ref 4.0–10.5)
nRBC: 0 % (ref 0.0–0.2)

## 2020-06-10 LAB — TYPE AND SCREEN
ABO/RH(D): O POS
Antibody Screen: NEGATIVE

## 2020-06-10 LAB — POCT PREGNANCY, URINE: Preg Test, Ur: NEGATIVE

## 2020-06-10 SURGERY — HYSTERECTOMY, VAGINAL
Anesthesia: General | Laterality: Left

## 2020-06-10 MED ORDER — CHLORHEXIDINE GLUCONATE 0.12 % MT SOLN
15.0000 mL | Freq: Once | OROMUCOSAL | Status: AC
  Administered 2020-06-10: 15 mL via OROMUCOSAL
  Filled 2020-06-10: qty 15

## 2020-06-10 MED ORDER — CEFAZOLIN SODIUM-DEXTROSE 2-4 GM/100ML-% IV SOLN: 2.0000 g | Freq: Once | INTRAVENOUS | Status: DC

## 2020-06-10 MED ORDER — MEPERIDINE HCL 25 MG/ML IJ SOLN: 6.2500 mg | INTRAMUSCULAR | Status: DC | PRN

## 2020-06-10 MED ORDER — BUPIVACAINE HCL (PF) 0.5 % IJ SOLN
INTRAMUSCULAR | Status: DC | PRN
  Administered 2020-06-10: 16 mL

## 2020-06-10 MED ORDER — PROPOFOL 10 MG/ML IV BOLUS
INTRAVENOUS | Status: DC | PRN
  Administered 2020-06-10: 150 mg via INTRAVENOUS

## 2020-06-10 MED ORDER — OXYCODONE HCL 5 MG PO TABS
5.0000 mg | ORAL_TABLET | Freq: Once | ORAL | Status: DC | PRN
Start: 2020-06-10 — End: 2020-06-10

## 2020-06-10 MED ORDER — ONDANSETRON HCL 4 MG/2ML IJ SOLN
INTRAMUSCULAR | Status: AC
  Filled 2020-06-10: qty 2

## 2020-06-10 MED ORDER — ONDANSETRON HCL 4 MG/2ML IJ SOLN: 4.0000 mg | Freq: Once | INTRAMUSCULAR | Status: DC | PRN

## 2020-06-10 MED ORDER — OXYCODONE HCL 5 MG/5ML PO SOLN: 5.0000 mg | Freq: Once | ORAL | Status: DC | PRN

## 2020-06-10 MED ORDER — KETOROLAC TROMETHAMINE 30 MG/ML IJ SOLN: 30.0000 mg | Freq: Once | INTRAMUSCULAR | Status: DC

## 2020-06-10 MED ORDER — FENTANYL CITRATE (PF) 250 MCG/5ML IJ SOLN
INTRAMUSCULAR | Status: DC | PRN
  Administered 2020-06-10: 50 ug via INTRAVENOUS
  Administered 2020-06-10: 100 ug via INTRAVENOUS
  Administered 2020-06-10 (×4): 50 ug via INTRAVENOUS

## 2020-06-10 MED ORDER — LACTATED RINGERS IV SOLN: INTRAVENOUS | Status: DC

## 2020-06-10 MED ORDER — CEFAZOLIN SODIUM-DEXTROSE 2-3 GM-%(50ML) IV SOLR
INTRAVENOUS | Status: DC | PRN
  Administered 2020-06-10: 2 g via INTRAVENOUS

## 2020-06-10 MED ORDER — ZOLPIDEM TARTRATE 5 MG PO TABS: 5.0000 mg | ORAL_TABLET | Freq: Every evening | ORAL | Status: DC | PRN

## 2020-06-10 MED ORDER — ACETAMINOPHEN 325 MG PO TABS: 325.0000 mg | ORAL_TABLET | ORAL | Status: DC | PRN

## 2020-06-10 MED ORDER — CEFAZOLIN SODIUM-DEXTROSE 2-4 GM/100ML-% IV SOLN
INTRAVENOUS | Status: AC
  Filled 2020-06-10: qty 100

## 2020-06-10 MED ORDER — FENTANYL CITRATE (PF) 100 MCG/2ML IJ SOLN
25.0000 ug | INTRAMUSCULAR | Status: DC | PRN
  Administered 2020-06-10: 25 ug via INTRAVENOUS

## 2020-06-10 MED ORDER — FENTANYL CITRATE (PF) 250 MCG/5ML IJ SOLN
INTRAMUSCULAR | Status: AC
  Filled 2020-06-10: qty 5

## 2020-06-10 MED ORDER — ENOXAPARIN SODIUM 40 MG/0.4ML IJ SOSY
40.0000 mg | PREFILLED_SYRINGE | INTRAMUSCULAR | Status: DC
  Administered 2020-06-11: 40 mg via SUBCUTANEOUS
  Filled 2020-06-10: qty 0.4

## 2020-06-10 MED ORDER — KETOROLAC TROMETHAMINE 30 MG/ML IJ SOLN
INTRAMUSCULAR | Status: AC
  Filled 2020-06-10: qty 1

## 2020-06-10 MED ORDER — OXYCODONE HCL 5 MG PO TABS
ORAL_TABLET | ORAL | Status: AC
  Filled 2020-06-10: qty 2

## 2020-06-10 MED ORDER — ACETAMINOPHEN 160 MG/5ML PO SOLN: 325.0000 mg | ORAL | Status: DC | PRN

## 2020-06-10 MED ORDER — ACETAMINOPHEN 10 MG/ML IV SOLN
INTRAVENOUS | Status: AC
  Filled 2020-06-10: qty 100

## 2020-06-10 MED ORDER — PROPOFOL 10 MG/ML IV BOLUS
INTRAVENOUS | Status: AC
  Filled 2020-06-10: qty 20

## 2020-06-10 MED ORDER — MIDAZOLAM HCL 2 MG/2ML IJ SOLN
INTRAMUSCULAR | Status: AC
  Filled 2020-06-10: qty 2

## 2020-06-10 MED ORDER — ACETAMINOPHEN 500 MG PO TABS
1000.0000 mg | ORAL_TABLET | Freq: Four times a day (QID) | ORAL | Status: DC
  Administered 2020-06-10 – 2020-06-11 (×3): 1000 mg via ORAL
  Filled 2020-06-10 (×3): qty 2

## 2020-06-10 MED ORDER — FENTANYL CITRATE (PF) 100 MCG/2ML IJ SOLN
INTRAMUSCULAR | Status: AC
  Filled 2020-06-10: qty 2

## 2020-06-10 MED ORDER — DEXAMETHASONE SODIUM PHOSPHATE 10 MG/ML IJ SOLN
INTRAMUSCULAR | Status: DC | PRN
  Administered 2020-06-10: 10 mg via INTRAVENOUS

## 2020-06-10 MED ORDER — MIDAZOLAM HCL 5 MG/5ML IJ SOLN
INTRAMUSCULAR | Status: DC | PRN
  Administered 2020-06-10: 2 mg via INTRAVENOUS

## 2020-06-10 MED ORDER — OXYCODONE HCL 5 MG PO TABS
5.0000 mg | ORAL_TABLET | ORAL | Status: DC | PRN
  Administered 2020-06-10 – 2020-06-11 (×3): 10 mg via ORAL
  Filled 2020-06-10 (×2): qty 2

## 2020-06-10 MED ORDER — ROCURONIUM BROMIDE 10 MG/ML (PF) SYRINGE
PREFILLED_SYRINGE | INTRAVENOUS | Status: DC | PRN
  Administered 2020-06-10: 30 mg via INTRAVENOUS

## 2020-06-10 MED ORDER — BUPIVACAINE HCL (PF) 0.5 % IJ SOLN
INTRAMUSCULAR | Status: AC
  Filled 2020-06-10: qty 30

## 2020-06-10 MED ORDER — ACETAMINOPHEN 10 MG/ML IV SOLN
INTRAVENOUS | Status: DC | PRN
  Administered 2020-06-10: 1000 mg via INTRAVENOUS

## 2020-06-10 MED ORDER — SUCCINYLCHOLINE CHLORIDE 20 MG/ML IJ SOLN
INTRAMUSCULAR | Status: DC | PRN
  Administered 2020-06-10: 120 mg via INTRAVENOUS

## 2020-06-10 MED ORDER — LACTATED RINGERS IV SOLN: INTRAVENOUS | Status: DC | PRN

## 2020-06-10 MED ORDER — LIDOCAINE 2% (20 MG/ML) 5 ML SYRINGE
INTRAMUSCULAR | Status: DC | PRN
  Administered 2020-06-10: 100 mg via INTRAVENOUS

## 2020-06-10 MED ORDER — ONDANSETRON HCL 4 MG/2ML IJ SOLN: 4.0000 mg | Freq: Four times a day (QID) | INTRAMUSCULAR | Status: DC | PRN

## 2020-06-10 MED ORDER — HYDROMORPHONE HCL 1 MG/ML IJ SOLN: 1.0000 mg | INTRAMUSCULAR | Status: DC | PRN

## 2020-06-10 MED ORDER — ONDANSETRON HCL 4 MG PO TABS: 4.0000 mg | ORAL_TABLET | Freq: Four times a day (QID) | ORAL | Status: DC | PRN

## 2020-06-10 MED ORDER — ORAL CARE MOUTH RINSE: 15.0000 mL | Freq: Once | OROMUCOSAL | Status: AC

## 2020-06-10 MED ORDER — SUGAMMADEX SODIUM 200 MG/2ML IV SOLN
INTRAVENOUS | Status: DC | PRN
  Administered 2020-06-10: 200 mg via INTRAVENOUS

## 2020-06-10 MED ORDER — PHENYLEPHRINE 40 MCG/ML (10ML) SYRINGE FOR IV PUSH (FOR BLOOD PRESSURE SUPPORT)
PREFILLED_SYRINGE | INTRAVENOUS | Status: DC | PRN
  Administered 2020-06-10 (×2): 40 ug via INTRAVENOUS

## 2020-06-10 SURGICAL SUPPLY — 21 items
GLOVE BIO SURGEON STRL SZ 6.5 (GLOVE) ×2 IMPLANT
GLOVE ECLIPSE 6.0 STRL STRAW (GLOVE) ×2 IMPLANT
GLOVE SURG UNDER POLY LF SZ7 (GLOVE) ×4 IMPLANT
GOWN STRL REUS W/ TWL LRG LVL3 (GOWN DISPOSABLE) ×4 IMPLANT
GOWN STRL REUS W/TWL LRG LVL3 (GOWN DISPOSABLE) ×8
KIT TURNOVER KIT B (KITS) ×2 IMPLANT
NS IRRIG 1000ML POUR BTL (IV SOLUTION) ×2 IMPLANT
PACK VAGINAL WOMENS (CUSTOM PROCEDURE TRAY) ×2 IMPLANT
PAD ABD 8X10 STRL (GAUZE/BANDAGES/DRESSINGS) ×1 IMPLANT
PAD OB MATERNITY 4.3X12.25 (PERSONAL CARE ITEMS) ×2 IMPLANT
SUT VIC AB 0 CT1 18XCR BRD8 (SUTURE) ×2 IMPLANT
SUT VIC AB 0 CT1 27 (SUTURE) ×2
SUT VIC AB 0 CT1 27XBRD ANBCTR (SUTURE) IMPLANT
SUT VIC AB 0 CT1 36 (SUTURE) ×1 IMPLANT
SUT VIC AB 0 CT1 8-18 (SUTURE) ×4
SUT VIC AB 2-0 CT1 27 (SUTURE) ×2
SUT VIC AB 2-0 CT1 TAPERPNT 27 (SUTURE) ×2 IMPLANT
SUT VICRYL 0 TIES 12 18 (SUTURE) ×2 IMPLANT
TOWEL GREEN STERILE FF (TOWEL DISPOSABLE) ×4 IMPLANT
TRAY FOLEY W/BAG SLVR 14FR (SET/KITS/TRAYS/PACK) ×2 IMPLANT
UNDERPAD 30X36 HEAVY ABSORB (UNDERPADS AND DIAPERS) ×2 IMPLANT

## 2020-06-10 NOTE — H&P (Signed)
Expand All Collapse All     Show:Clear all [x] Manual[x] Template[x] Copied  Added by: [x] Woodroe Mode, MD   [] Hover for details  Patient ID: Renee Collier, female   DOB: November 09, 1970, 50 y.o.   MRN: [id number]     Chief Complaint  Patient presents with   Heavy periods    HPI Renee Collier is a 50 y.o. female.  [id number] Patient's last menstrual period was 06/10/2020. . (Menstrual status: Irregular Periods). She returns to schedule TVH after having [place] for pelvic anatomy. Her menses are irregular and heavy. She has to leave work due to heavy bleeding. HPI      Past Medical History:  Diagnosis Date  . Hypertension          Past Surgical History:  Procedure Laterality Date  . DILATION AND CURETTAGE OF UTERUS N/A 04/27/2017   Procedure: Ultrasound Guided Dilatation and Curettage;  Surgeon: [name], MD;  Location: [place];  Service: Gynecology;  Laterality: N/A;  . ENDOMETRIAL ABLATION    . HYSTEROSCOPY N/A 03/12/2014   Procedure: HYSTEROSCOPY WITH HYDROTHERMAL ABLATION;  Surgeon: Osborne Oman, MD;  Location: [facility];  Service: Gynecology;  Laterality: N/A;  . TUBAL LIGATION           Family History  Problem Relation Age of Onset  . Cancer Mother        cervical  . Ovarian cancer Sister     Social History Social History        Tobacco Use  . Smoking status: Never Smoker  . Smokeless tobacco: Never Used  Vaping Use  . Vaping Use: Never used  Substance Use Topics  . Alcohol use: Yes    Comment: occ  . Drug use: No    No Known Allergies        Current Outpatient Medications  Medication Sig Dispense Refill  . hydrochlorothiazide (HYDRODIURIL) 25 MG tablet TAKE 1 TABLET (25 MG TOTAL) BY MOUTH DAILY. MUST HAVE OFFICE VISIT FOR REFILLS 30 tablet 6  . metroNIDAZOLE (FLAGYL) 500 MG tablet Take 1 tablet (500 mg total) by mouth 2 (two) times daily. 14 tablet 0  . naproxen (NAPROSYN) 500 MG tablet TAKE 1 TABLET  (500 MG TOTAL) BY MOUTH 2 (TWO) TIMES DAILY WITH A MEAL. X 10 DAYS THEN PRN PAIN 60 tablet 0  . valACYclovir (VALTREX) 1000 MG tablet Take 1 tablet (1,000 mg total) by mouth 3 (three) times daily. 21 tablet 0  . cyclobenzaprine (FLEXERIL) 10 MG tablet 1/2 in the morning and afternoon prn muscle spasm and 1 at bedtime prn (Patient not taking: Reported on 05/05/2020) 30 tablet 0  . gabapentin (NEURONTIN) 300 MG capsule Take 1 capsule (300 mg total) by mouth 3 (three) times daily. Prn nerve pain (Patient not taking: Reported on 05/05/2020) 60 capsule 3  . Vitamin D, Ergocalciferol, (DRISDOL) 1.25 MG (50000 UT) CAPS capsule Take 1 capsule (50,000 Units total) by mouth every 7 (seven) days. (Patient not taking: Reported on 05/05/2020) 16 capsule 0   No current facility-administered medications for this visit.    Review of Systems Review of Systems  Constitutional: Negative.   Respiratory: Negative.   Gastrointestinal: Negative.   Genitourinary: Positive for menstrual problem. Negative for vaginal bleeding and vaginal discharge.    Blood pressure (!) 146/101, pulse 84, resp. rate 18, height 5\' 3"  (1.6 m), weight 95.7 kg, last menstrual period 06/10/2020, SpO2 100 %.   Physical Exam Physical Exam Vitals and nursing note reviewed.  Constitutional:  Appearance: She is obese.  Pulmonary:     Effort: Pulmonary effort is normal.  Neurological:     Mental Status: She is alert.  Psychiatric:        Mood and Affect: Mood normal.        Behavior: Behavior normal.     Data Reviewed Narrative & Impression  CLINICAL DATA: Dysfunctional uterine bleeding, history of uterine fibroids; LMP 04/11/2020; G3P3; history tubal ligation, endometrial ablation  EXAM: TRANSABDOMINAL AND TRANSVAGINAL ULTRASOUND OF PELVIS  TECHNIQUE: Both transabdominal and transvaginal ultrasound examinations of the pelvis were performed. Transabdominal technique was performed for global imaging of the pelvis  including uterus, ovaries, adnexal regions, and pelvic cul-de-sac. It was necessary to proceed with endovaginal exam following the transabdominal exam to visualize the endometrium.  COMPARISON: 10/16/2017  FINDINGS: Uterus  Measurements: 8.1 x 4.9 x 5.4 cm = volume: 112 mL. Anteverted. Heterogeneous myometrium. Multiple uterine masses consistent with leiomyomata. Submucosal leiomyoma at posterior mid uterus 3.6 cm diameter. Additional leiomyomata are seen at RIGHT fundus intramural 3.2 cm and posterior LEFT uterus 3.1 cm subserosal.  Endometrium  Thickness: 8 mm. No definite endometrial fluid or or mass  Right ovary  Measurements: 2.8 x 1.1 x 2.1 cm = volume: 3.3 mL. Normal morphology without mass  Left ovary  Measurements: 2.4 x 1.3 x 1.2 cm = volume: 1.9 mL. Suboptimally visualized on transabdominal imaging due to position, uterus, and bowel. No gross abnormalities.  Other findings  No free pelvic fluid. No adnexal masses.  IMPRESSION: Multiple uterine leiomyomata, including a 3.6 cm diameter submucosal leiomyoma at posterior mid uterus.  Unremarkable endometrial complex and ovaries.   Electronically Signed By: Lavonia Dana M.D. On: 04/16/2020 11:20   CBC Labs (Brief)     Component Value Date/Time   WBC 6.6 04/04/2020 1054   WBC 5.8 04/26/2017 1330   RBC 4.66 04/04/2020 1054   RBC 4.67 04/26/2017 1330   HGB 13.5 04/04/2020 1054   HCT 41.4 04/04/2020 1054   PLT 371 04/04/2020 1054   MCV 89 04/04/2020 1054   MCH 29.0 04/04/2020 1054   MCH 29.8 04/26/2017 1330   MCHC 32.6 04/04/2020 1054   MCHC 32.5 04/26/2017 1330   RDW 14.2 04/04/2020 1054   LYMPHSABS 3.2 (H) 01/01/2020 1414   MONOABS 0.4 11/24/2015 1820   EOSABS 0.1 01/01/2020 1414   BASOSABS 0.1 01/01/2020 1414      CBC    Component Value Date/Time   WBC 6.8 06/10/2020 0553   RBC 4.40 06/10/2020 0553   HGB 12.9 06/10/2020 0553   HGB 13.5 04/04/2020  1054   HCT 40.7 06/10/2020 0553   HCT 41.4 04/04/2020 1054   PLT 405 (H) 06/10/2020 0553   PLT 371 04/04/2020 1054   MCV 92.5 06/10/2020 0553   MCV 89 04/04/2020 1054   MCH 29.3 06/10/2020 0553   MCHC 31.7 06/10/2020 0553   RDW 14.0 06/10/2020 0553   RDW 14.2 04/04/2020 1054   LYMPHSABS 3.2 (H) 01/01/2020 1414   MONOABS 0.4 11/24/2015 1820   EOSABS 0.1 01/01/2020 1414   BASOSABS 0.1 01/01/2020 1414     Assessment DUB, uterine fibroids, menorrhagia Benign endometrium at [facility] 2019 Request TVH  Plan TVH and BS discussed. Patient desires surgical management   The risks of surgery were discussed in detail with the patient including but not limited to: bleeding which may require transfusion or reoperation; infection which may require prolonged hospitalization or re-hospitalization and antibiotic therapy; injury to bowel, bladder, ureters and major vessels or  other surrounding organs; formation of adhesions;need for additional procedures including laparotomy or subsequent procedures secondary to abnormal pathology; thromboembolic phenomenon; incisional problems and other postoperative or anesthesia complications.  Patient was told that the likelihood that her condition and symptoms will be treated effectively with this surgical management was very high; the postoperative expectations were also discussed in detail. The patient also understands the alternative treatment options which were discussed in full. All questions were answered.    Emeterio Reeve 06/10/2020 7:24 AM

## 2020-06-10 NOTE — Transfer of Care (Signed)
Immediate Anesthesia Transfer of Care Note  Patient: Renee Collier  Procedure(s) Performed: HYSTERECTOMY VAGINAL with LEFT Salpingectomy (Left )  Patient Location: PACU  Anesthesia Type:General  Level of Consciousness: awake and patient cooperative  Airway & Oxygen Therapy: Patient Spontanous Breathing and Patient connected to face mask oxygen  Post-op Assessment: Report given to RN and Post -op Vital signs reviewed and stable  Post vital signs: Reviewed and stable  Last Vitals:  Vitals Value Taken Time  BP 175/92 06/10/20 0946  Temp 36.4 C 06/10/20 0945  Pulse 77 06/10/20 0951  Resp 12 06/10/20 0951  SpO2 100 % 06/10/20 0951  Vitals shown include unvalidated device data.  Last Pain:  Vitals:   06/10/20 0945  TempSrc:   PainSc: 8          Complications: No complications documented.

## 2020-06-10 NOTE — Anesthesia Procedure Notes (Signed)
Procedure Name: Intubation Date/Time: 06/10/2020 7:57 AM Performed by: Hewitt Blade, CRNA Pre-anesthesia Checklist: Patient identified, Emergency Drugs available, Suction available and Patient being monitored Patient Re-evaluated:Patient Re-evaluated prior to induction Oxygen Delivery Method: Circle system utilized Preoxygenation: Pre-oxygenation with 100% oxygen Induction Type: IV induction Ventilation: Mask ventilation without difficulty Laryngoscope Size: Mac and 3 Grade View: Grade I Tube type: Oral Tube size: 7.0 mm Number of attempts: 1 Airway Equipment and Method: Stylet and Oral airway Placement Confirmation: ETT inserted through vocal cords under direct vision,  positive ETCO2 and breath sounds checked- equal and bilateral Secured at: 21 cm Tube secured with: Tape Dental Injury: Teeth and Oropharynx as per pre-operative assessment

## 2020-06-10 NOTE — Discharge Instructions (Signed)
Vaginal Hysterectomy, Care After The following information offers guidance on how to care for yourself after your procedure. Your health care provider may also give you more specific instructions. If you have problems or questions, contact your health care provider. What can I expect after the procedure? After the procedure, it is common to have:  Pain in the lower abdomen and vagina.  Vaginal bleeding and discharge for up to 1 week. You will need to use a sanitary pad after this procedure.  Difficulty having a bowel movement (constipation).  Temporary problems emptying the bladder.  Tiredness (fatigue).  Poor appetite.  Less interest in sex.  Feelings of sadness or other emotions. If your ovaries were also removed, it is also common to have symptoms of menopause, such as hot flashes, night sweats, and lack of sleep (insomnia). Follow these instructions at home: Medicines  Take over-the-counter and prescription medicines only as told by your health care provider.  Do not take aspirin or NSAIDs, such as ibuprofen. These medicines can cause bleeding.  Ask your health care provider if the medicine prescribed to you: ? Requires you to avoid driving or using machinery. ? Can cause constipation. You may need to take these actions to prevent or treat constipation:  Drink enough fluid to keep your urine pale yellow.  Take over-the-counter or prescription medicines.  Eat foods that are high in fiber, such as beans, whole grains, and fresh fruits and vegetables.  Limit foods that are high in fat and processed sugars, such as fried or sweet foods.   Activity  Rest as told by your health care provider.  Return to your normal activities as told by your health care provider. Ask your health care provider what activities are safe for you  Avoid sitting for a long time without moving. Get up to take short walks every 1-2 hours. This is important to improve blood flow and breathing. Ask  for help if you feel weak or unsteady.  Try to have someone home with you for 1-2 weeks to help you with everyday chores.  Do not lift anything that is heavier than 10 lb (4.5 kg), or the limit that you are told, until your health care provider says that it is safe.  If you were given a sedative during the procedure, it can affect you for several hours. Do not drive or operate machinery until your health care provider says that it is safe.   Lifestyle  Do not use any products that contain nicotine or tobacco. These products include cigarettes, chewing tobacco, and vaping devices, such as e-cigarettes. These can delay healing after surgery. If you need help quitting, ask your health care provider.  Do not drink alcohol until your health care provider approves. General instructions  Do not douche, use tampons, or have sex for at least 6 weeks, or as told by your health care provider.  If you struggle with physical or emotional changes after your procedure, speak with your health care provider or a therapist.  The stitches inside your vagina will dissolve over time and do not need to be taken out.  Do not take baths, swim, or use a hot tub until your health care provider approves. You may only be allowed to take showers for 2-3 weeks  Wear compression stockings as told by your health care provider. These stockings help to prevent blood clots and reduce swelling in your legs.  Keep all follow-up visits. This is important. Contact a health care provider if:  Your pain medicine is not helping.  You have a fever.  You have nausea or vomiting that does not go away.  You feel dizzy.  You have blood, pus, or a bad-smelling discharge from your vagina more than 1 week after the procedure.  You continue to have trouble urinating 3-5 days after the procedure. Get help right away if:  You have severe pain in your abdomen or back.  You faint.  You have heavy vaginal bleeding and blood  clots, soaking through a sanitary pad in less than 1 hour.  You have chest pain or shortness of breath.  You have pain, swelling, or redness in your leg. These symptoms may represent a serious problem that is an emergency. Do not wait to see if the symptoms will go away. Get medical help right away. Call your local emergency services (911 in the [place] Do not drive yourself to the hospital. Summary  After the procedure, it is common to have pain, vaginal bleeding, constipation, temporary problems emptying your bladder, and feelings of sadness or other emotions.  Take over-the-counter and prescription medicines only as told by your health care provider.  Rest as told by your health care provider. Return to your normal activities as told by your health care provider.  Contact a health care provider if your pain medicine is not helping, or you have a fever, dizziness, or trouble urinating several days after the procedure.  Get help right away if you have severe pain in your abdomen or back, or if you faint, have heavy bleeding, or have chest pain or shortness of breath. This information is not intended to replace advice given to you by your health care provider. Make sure you discuss any questions you have with your health care provider. Document Revised: 09/21/2019 Document Reviewed: 09/21/2019 Elsevier Patient Education  [employer].

## 2020-06-10 NOTE — Progress Notes (Signed)
Received from PACU  Patient alert and oriented x 4  Patient awake but wanting to sleep.  IV infusing LWrist LR @ 75 mL  Bedside table and call bell in reach for patient safety..  Will continue to monitor patient.

## 2020-06-10 NOTE — Op Note (Signed)
[facility] PROCEDURE DATE: 06/10/2020  PREOPERATIVE DIAGNOSIS:  Symptomatic fibroids, menorrhagia POSTOPERATIVE DIAGNOSIS:  Symptomatic fibroids, menorrhagia SURGEON:  Woodroe Mode, MD ASSISTANT:  Dr. Joanell Rising.  An experienced assistant was required given the standard of surgical care given the complexity of the case.  This assistant was needed for exposure, dissection, suctioning, retraction, instrument exchange, and for overall help during the procedure. OPERATION:  Total Vaginal hysterectomy, left salpingectomy ANESTHESIA:  General endotracheal.  INDICATIONS: The patient is a 50 y.o. [id number] with history of symptomatic uterine fibroids/menorrhagia. The patient made a decision to undergo definite surgical treatment. On the preoperative visit, the risks, benefits, indications, and alternatives of the procedure were reviewed with the patient.  On the day of surgery, the risks of surgery were again discussed with the patient including but not limited to: bleeding which may require transfusion or reoperation; infection which may require antibiotics; injury to bowel, bladder, ureters or other surrounding organs; need for additional procedures; thromboembolic phenomenon, incisional problems and other postoperative/anesthesia complications. Written informed consent was obtained.    OPERATIVE FINDINGS: A 6 week size uterus with normal tubes and ovaries bilaterally.  ESTIMATED BLOOD LOSS: 250 ml FLUIDS:  2000 ml of Lactated Ringers URINE OUTPUT:  200 ml of clear yellow urine. SPECIMENS:  Uterus and cervix, left Fallopian tube sent to pathology COMPLICATIONS:  None immediate.  DESCRIPTION OF PROCEDURE:  The patient received intravenous antibiotics and had sequential compression devices applied to her lower extremities while in the preoperative area.  She was then taken to the operating room where general anesthesia was administered and was found to be adequate.  She was placed in the dorsal  lithotomy position, and was prepped and draped in a sterile manner.  A Foley catheter was inserted into her bladder and attached to constant drainage. After an adequate timeout was performed, attention was turned to her pelvis.  A weighted speculum was then placed in the vagina, and the anterior and posterior lips of the cervix were grasped bilaterally with tenaculums.  The cervix was then injected circumferentially with 0.5% Marcaine with epinephrine solution to maintain hemostasis.  The cervix was then circumferentially incised, and the bladder was dissected off the pubocervical fascia anteriorly without complication.  The anterior cul-de-sac was then entered sharply without difficulty and a retractor was placed.  The same procedure was performed posteriorly and the posterior cul-de-sac was entered sharply without difficulty.  A long weighted speculum was inserted into the posterior cul-de-sac.  The Heaney clamp was then used to clamp the uterosacral ligaments on either side.  They were then cut and sutured ligated with 0 Vicryl, and the ligated uterosacral ligaments were transfixed to the ipsilateral vaginal epithelium to further support the vagina and provide hemostasis. Of note, all sutures used in this case were 0 Vicryl unless otherwise noted.   The cardinal ligaments were then clamped, cut and ligated. The uterine vessels and broad ligaments were then serially clamped with the Heaney clamps, cut, and suture ligated on both sides.  Excellent hemostasis was noted at this point.  The uterus was then delivered via the posterior cul-de-sac, and the cornua were clamped with the Heaney clamps, transected, and the uterus was delivered and sent to pathology. These pedicles were then suture ligated to ensure hemostasis.  After completion of the hysterectomy, all pedicles from the uterosacral ligament to the cornua were examined hemostasis was confirmed.  The left tube was identified and clamped with a Kelly clamp and  the distal portion was excised  and ligated with 2-0 Vicryl. The right tube could not be identified. The vaginal cuff was then closed with a in a running locked fashion with care given to incorporate the uterosacral pedicles bilaterally.  All instruments were then removed from the pelvis. Patient tolerated the procedure well.  All instruments, needles, and sponge counts were correct x 3. The patient was taken to the recovery room in stable condition.    Woodroe Mode, MD [place], [facility] for [facility], [place]

## 2020-06-10 NOTE — Plan of Care (Signed)
  Problem: Education: Goal: Knowledge of General Education information will improve Description: Including pain rating scale, medication(s)/side effects and non-pharmacologic comfort measures Outcome: Progressing   Problem: Health Behavior/Discharge Planning: Goal: Ability to manage health-related needs will improve Outcome: Progressing   Problem: Clinical Measurements: Goal: Ability to maintain clinical measurements within normal limits will improve Outcome: Progressing Goal: Will remain free from infection Outcome: Progressing Goal: Diagnostic test results will improve Outcome: Progressing Goal: Respiratory complications will improve Outcome: Progressing Goal: Cardiovascular complication will be avoided Outcome: Progressing   Problem: Activity: Goal: Risk for activity intolerance will decrease Outcome: Progressing   Problem: Nutrition: Goal: Adequate nutrition will be maintained Outcome: Progressing   Problem: Coping: Goal: Level of anxiety will decrease Outcome: Progressing   Problem: Elimination: Goal: Will not experience complications related to bowel motility Outcome: Progressing Goal: Will not experience complications related to urinary retention Outcome: Progressing   Problem: Pain Managment: Goal: General experience of comfort will improve Outcome: Progressing   Problem: Safety: Goal: Ability to remain free from injury will improve Outcome: Progressing   Problem: Skin Integrity: Goal: Risk for impaired skin integrity will decrease Outcome: Progressing   Problem: Education: Goal: Knowledge of the prescribed therapeutic regimen will improve Outcome: Progressing Goal: Understanding of sexual limitations or changes related to disease process or condition will improve Outcome: Progressing Goal: Individualized Educational Video(s) Outcome: Progressing   Problem: Self-Concept: Goal: Communication of feelings regarding changes in body function or  appearance will improve Outcome: Progressing   Problem: Skin Integrity: Goal: Demonstration of wound healing without infection will improve Outcome: Progressing   

## 2020-06-10 NOTE — Anesthesia Postprocedure Evaluation (Signed)
Anesthesia Post Note  Patient: Renee Collier  Procedure(s) Performed: HYSTERECTOMY VAGINAL with LEFT Salpingectomy (Left )     Patient location during evaluation: PACU Anesthesia Type: General Level of consciousness: awake and alert Pain management: pain level controlled Vital Signs Assessment: post-procedure vital signs reviewed and stable Respiratory status: spontaneous breathing, nonlabored ventilation, respiratory function stable and patient connected to nasal cannula oxygen Cardiovascular status: blood pressure returned to baseline and stable Postop Assessment: no apparent nausea or vomiting Anesthetic complications: no   No complications documented.  Last Vitals:  Vitals:   06/10/20 1045 06/10/20 1115  BP: (!) 140/97 (!) 137/92  Pulse: 72 76  Resp: 13 15  Temp: 36.5 C 36.6 C  SpO2: 100% 97%    Last Pain:  Vitals:   06/10/20 1115  TempSrc: Oral  PainSc: Asleep                 Jayme Cham

## 2020-06-11 ENCOUNTER — Encounter (HOSPITAL_COMMUNITY): Payer: Self-pay | Admitting: Obstetrics & Gynecology

## 2020-06-11 ENCOUNTER — Other Ambulatory Visit: Payer: Self-pay

## 2020-06-11 DIAGNOSIS — I10 Essential (primary) hypertension: Secondary | ICD-10-CM | POA: Diagnosis not present

## 2020-06-11 DIAGNOSIS — D259 Leiomyoma of uterus, unspecified: Secondary | ICD-10-CM | POA: Diagnosis not present

## 2020-06-11 DIAGNOSIS — N800 Endometriosis of uterus: Secondary | ICD-10-CM | POA: Diagnosis not present

## 2020-06-11 DIAGNOSIS — N839 Noninflammatory disorder of ovary, fallopian tube and broad ligament, unspecified: Secondary | ICD-10-CM | POA: Diagnosis not present

## 2020-06-11 DIAGNOSIS — N921 Excessive and frequent menstruation with irregular cycle: Secondary | ICD-10-CM | POA: Diagnosis not present

## 2020-06-11 DIAGNOSIS — Z79899 Other long term (current) drug therapy: Secondary | ICD-10-CM | POA: Diagnosis not present

## 2020-06-11 LAB — CBC
HCT: 38.4 % (ref 36.0–46.0)
Hemoglobin: 12.8 g/dL (ref 12.0–15.0)
MCH: 30.5 pg (ref 26.0–34.0)
MCHC: 33.3 g/dL (ref 30.0–36.0)
MCV: 91.4 fL (ref 80.0–100.0)
Platelets: 404 10*3/uL — ABNORMAL HIGH (ref 150–400)
RBC: 4.2 MIL/uL (ref 3.87–5.11)
RDW: 14 % (ref 11.5–15.5)
WBC: 13.1 10*3/uL — ABNORMAL HIGH (ref 4.0–10.5)
nRBC: 0 % (ref 0.0–0.2)

## 2020-06-11 LAB — SURGICAL PATHOLOGY

## 2020-06-11 MED ORDER — OXYCODONE HCL 5 MG PO TABS
5.0000 mg | ORAL_TABLET | ORAL | 0 refills | Status: DC | PRN
  Filled 2020-06-11: qty 25, 2d supply, fill #0

## 2020-06-11 MED ORDER — OXYCODONE-ACETAMINOPHEN 5-325 MG PO TABS: 1.0000 | ORAL_TABLET | ORAL | 0 refills | Status: DC | PRN

## 2020-06-11 NOTE — Discharge Summary (Signed)
Gynecology Physician Postoperative Discharge Summary  Patient ID: Renee Collier MRN: [id number] DOB/AGE: 1970/07/20 50 y.o.  Admit Date: 06/10/2020 Discharge Date: 06/11/2020  Preoperative Diagnoses: DUB and fibroids  Procedures: Procedure(s) (LRB): HYSTERECTOMY VAGINAL with LEFT Salpingectomy (Left)  Hospital Course:  Renee Collier is a 50 y.o. [id number]  admitted for scheduled surgery.  She underwent the procedures as mentioned above, her operation was uncomplicated. For further details about surgery, please refer to the operative report. Patient had an uncomplicated postoperative course. By time of discharge on POD#1, her pain was controlled on oral pain medications; she was ambulating, voiding without difficulty, tolerating regular diet and passing flatus. She was deemed stable for discharge to home.   Significant Labs: CBC Latest Ref Rng & Units 06/11/2020 06/10/2020 04/04/2020  WBC 4.0 - 10.5 K/uL 13.1(H) 6.8 6.6  Hemoglobin 12.0 - 15.0 g/dL 12.8 12.9 13.5  Hematocrit 36.0 - 46.0 % 38.4 40.7 41.4  Platelets 150 - 400 K/uL 404(H) 405(H) 371    Discharge Exam: Blood pressure 132/77, pulse 81, temperature 97.9 F (36.6 C), temperature source Oral, resp. rate 17, height 5\' 3"  (1.6 m), weight 95.7 kg, last menstrual period 06/10/2020, SpO2 98 %. General appearance: alert and no distress  Resp: clear to auscultation bilaterally  Cardio: regular rate and rhythm  GI: soft, non-tender; bowel sounds normal; no masses, no organomegaly.  Incision: C/D/I, no erythema, no drainage noted Pelvic: scant blood on pad  Extremities: extremities normal, atraumatic, no cyanosis or edema and Homans sign is negative, no sign of DVT  Discharged Condition: Stable  Disposition: Discharge disposition: 01-Home or Self Care       Discharge Instructions    Discharge patient   Complete by: As directed    Discharge disposition: 01-Home or Self Care   Discharge patient date: 06/11/2020     Allergies as  of 06/11/2020   No Known Allergies     Medication List    STOP taking these medications   MIDOL PO     TAKE these medications   cyclobenzaprine 10 MG tablet Commonly known as: FLEXERIL 1/2 in the morning and afternoon prn muscle spasm and 1 at bedtime prn   gabapentin 300 MG capsule Commonly known as: NEURONTIN Take 1 capsule (300 mg total) by mouth 3 (three) times daily. Prn nerve pain   hydrochlorothiazide 25 MG tablet Commonly known as: HYDRODIURIL TAKE 1 TABLET (25 MG TOTAL) BY MOUTH DAILY. MUST HAVE OFFICE VISIT FOR REFILLS What changed:   how much to take  how to take this  when to take this   oxyCODONE-acetaminophen 5-325 MG tablet Commonly known as: PERCOCET/ROXICET Take 1-2 tablets by mouth every 4 (four) hours as needed for severe pain.   valACYclovir 1000 MG tablet Commonly known as: VALTREX Take 1 tablet (1,000 mg total) by mouth 3 (three) times daily.       Follow-up [place] for [facility] In 4 weeks.   Specialty: Obstetrics and Gynecology Contact information: [place] [address] [phone]              Signed: Woodroe Mode, MD, [place], [place]

## 2020-06-12 ENCOUNTER — Telehealth: Payer: Self-pay

## 2020-06-12 NOTE — Telephone Encounter (Signed)
Transition Care Management Unsuccessful Follow-up Telephone Call  Date of discharge and from where:  06/11/2020, [facility]  Attempts:  1st Attempt  Reason for unsuccessful TCM follow-up call:  Left voice message on # [phone].

## 2020-06-13 ENCOUNTER — Telehealth: Payer: Self-pay

## 2020-06-13 NOTE — Telephone Encounter (Signed)
Transition Care Management Follow-up Telephone Call  Date of discharge and from where:[facility] on 06/11/2020 How have you been since you were released from the hospital? [facility] Any questions or concerns? No questions/concerns reported.  Items Reviewed: Did the pt receive and understand the discharge instructions provided? have the instructions and have no questions.  Medications obtained and verified? She said she have the medication list  and the hospital staff reviewed them in detail prior to discharge. She said she have no questions.  Any new allergies since your discharge? None reported  Do you have support at home? Yes, daughter  Other (ie: DME, Home Health, etc)     NONE  Functional Questionnaire: (I = Independent and D = Dependent) ADL's:  Independent.      Follow up appointments reviewed:   PCP Hospital f/u appt confirmed? Dr  Margarita Rana  @ 1050.  [facility] f/u appt confirmed? None scheduled at this time  Are transportation arrangements needed? have transportation   If their condition worsens, is the pt aware to call  their PCP or go to the ED? Yes.Made pt aware if condition worsen or start experiencing rapid weight gain, chest pain, diff breathing, SOB, high fevers, or bleading to refer imediately to ED for further evaluation.  Was the patient provided with contact information for the PCP's office or ED? He has the phone number  Was the pt encouraged to call back with questions or concerns?yes

## 2020-06-23 ENCOUNTER — Telehealth: Payer: Self-pay | Admitting: *Deleted

## 2020-06-23 NOTE — Telephone Encounter (Signed)
Call to patient- Advised FMLA forms complete.  No instructions indicating where to send completed forms.  Patient will come by in am to pick-up. Copy left at front desk for patient.   Encounter closed.

## 2020-07-15 ENCOUNTER — Ambulatory Visit: Payer: BC Managed Care – PPO | Attending: Family Medicine | Admitting: Family Medicine

## 2020-07-15 ENCOUNTER — Encounter: Payer: Self-pay | Admitting: Family Medicine

## 2020-07-15 ENCOUNTER — Other Ambulatory Visit: Payer: Self-pay

## 2020-07-15 DIAGNOSIS — I10 Essential (primary) hypertension: Secondary | ICD-10-CM

## 2020-07-15 DIAGNOSIS — E876 Hypokalemia: Secondary | ICD-10-CM | POA: Diagnosis not present

## 2020-07-15 NOTE — Progress Notes (Signed)
Virtual Visit via Telephone Note  I connected with Renee Collier, on 07/15/2020 at 11:01 AM by telephone due to the COVID-19 pandemic and verified that I am speaking with the correct person using two identifiers.   Consent: I discussed the limitations, risks, security and privacy concerns of performing an evaluation and management service by telephone and the availability of in person appointments. I also discussed with the patient that there may be a patient responsible charge related to this service. The patient expressed understanding and agreed to proceed.   Location of Patient: Home  Location of Provider: Clinic   Persons participating in Telemedicine visit: Renee Collier Dr. Margarita Rana     History of Present Illness: 50 year old female with history of hypertension, abnormal uterine bleed (status post vaginal hysterectomy with left salpingooophorectomy last month). Has a Post op visit in 3 days with GYN. She feels great and is ready to return to work.  Previously had constipation which has resolved.  She no longer requires her pain medication.  Compliant with her hydrochlorothiazide and last set of labs from the time of her surgery revealed hypokalemia with a potassium of 3.2 and she did have some leukocytosis.  She denies presence of leg cramping and has no additional concerns.  Past Medical History:  Diagnosis Date   Hypertension    No Known Allergies  Current Outpatient Medications on File Prior to Visit  Medication Sig Dispense Refill   cyclobenzaprine (FLEXERIL) 10 MG tablet 1/2 in the morning and afternoon prn muscle spasm and 1 at bedtime prn (Patient not taking: No sig reported) 30 tablet 0   gabapentin (NEURONTIN) 300 MG capsule Take 1 capsule (300 mg total) by mouth 3 (three) times daily. Prn nerve pain (Patient not taking: No sig reported) 60 capsule 3   hydrochlorothiazide (HYDRODIURIL) 25 MG tablet TAKE 1 TABLET (25 MG TOTAL) BY MOUTH DAILY. MUST HAVE OFFICE  VISIT FOR REFILLS (Patient taking differently: Take 25 mg by mouth daily.) 30 tablet 6   oxyCODONE-acetaminophen (PERCOCET/ROXICET) 5-325 MG tablet Take 1-2 tablets by mouth every 4 (four) hours as needed for severe pain. 25 tablet 0   valACYclovir (VALTREX) 1000 MG tablet Take 1 tablet (1,000 mg total) by mouth 3 (three) times daily. 21 tablet 0   No current facility-administered medications on file prior to visit.    ROS: See HPI  Observations/Objective: Awake, alert, oriented x3 Not in acute distress Normal mood  CMP Latest Ref Rng & Units 06/10/2020 01/01/2020 12/14/2018  Glucose 70 - 99 mg/dL 102(H) 114(H) 114(H)  BUN 6 - 20 mg/dL 11 12 9   Creatinine 0.44 - 1.00 mg/dL 0.81 0.74 0.97  Sodium 135 - 145 mmol/L 138 142 145(H)  Potassium 3.5 - 5.1 mmol/L 3.2(L) 3.8 3.7  Chloride 98 - 111 mmol/L 102 102 105  CO2 22 - 32 mmol/L 28 28 24   Calcium 8.9 - 10.3 mg/dL 9.0 9.7 9.5  Total Protein 6.0 - 8.5 g/dL - 7.3 7.2  Total Bilirubin 0.0 - 1.2 mg/dL - <0.2 <0.2  Alkaline Phos 44 - 121 IU/L - 123(H) 139(H)  AST 0 - 40 IU/L - 7 13  ALT 0 - 32 IU/L - 6 8    CBC    Component Value Date/Time   WBC 13.1 (H) 06/11/2020 0112   RBC 4.20 06/11/2020 0112   HGB 12.8 06/11/2020 0112   HGB 13.5 04/04/2020 1054   HCT 38.4 06/11/2020 0112   HCT 41.4 04/04/2020 1054   PLT 404 (H) 06/11/2020 0112  PLT 371 04/04/2020 1054   MCV 91.4 06/11/2020 0112   MCV 89 04/04/2020 1054   MCH 30.5 06/11/2020 0112   MCHC 33.3 06/11/2020 0112   RDW 14.0 06/11/2020 0112   RDW 14.2 04/04/2020 1054   LYMPHSABS 3.2 (H) 01/01/2020 1414   MONOABS 0.4 11/24/2015 1820   EOSABS 0.1 01/01/2020 1414   BASOSABS 0.1 01/01/2020 1414    Assessment and Plan: 1. Hypertension, unspecified type Controlled Continue HCTZ Counseled on blood pressure goal of less than 130/80, low-sodium, DASH diet, medication compliance, 150 minutes of moderate intensity exercise per week. Discussed medication compliance, adverse  effects.   2. Hypokalemia Diuretic induced and Advised to have her OB order repeat labs at her next visit If still hypokalemic we will need to place her on potassium supplements Counseled to increase intake of potassium rich foods   Follow Up Instructions: 6 months   I discussed the assessment and treatment plan with the patient. The patient was provided an opportunity to ask questions and all were answered. The patient agreed with the plan and demonstrated an understanding of the instructions.   The patient was advised to call back or seek an in-person evaluation if the symptoms worsen or if the condition fails to improve as anticipated.     I provided 12 minutes total of non-face-to-face time during this encounter.   Charlott Rakes, MD, FAAFP. [facility] and [place], [place]   07/15/2020, 11:01 AM

## 2020-07-18 ENCOUNTER — Encounter: Payer: Self-pay | Admitting: Obstetrics & Gynecology

## 2020-07-18 ENCOUNTER — Ambulatory Visit (INDEPENDENT_AMBULATORY_CARE_PROVIDER_SITE_OTHER): Payer: BC Managed Care – PPO | Admitting: Obstetrics & Gynecology

## 2020-07-18 ENCOUNTER — Other Ambulatory Visit (HOSPITAL_COMMUNITY)
Admission: RE | Admit: 2020-07-18 | Discharge: 2020-07-18 | Disposition: A | Discharge status: Home / Self Care | Payer: BC Managed Care – PPO | Source: Ambulatory Visit | Attending: Obstetrics & Gynecology | Admitting: Obstetrics & Gynecology

## 2020-07-18 ENCOUNTER — Other Ambulatory Visit: Payer: Self-pay

## 2020-07-18 VITALS — BP 150/94 | HR 84 | Wt 218.2 lb

## 2020-07-18 DIAGNOSIS — N898 Other specified noninflammatory disorders of vagina: Secondary | ICD-10-CM | POA: Insufficient documentation

## 2020-07-18 NOTE — Progress Notes (Signed)
Subjective:     Renee Collier is a 50 y.o. female who presents to the clinic 5 weeks status post vaginal hysterectomy for abnormal uterine bleeding and fibroids. Eating a regular diet without difficulty. Bowel movements are normal. The patient is not having any pain.  The following portions of the patient's history were reviewed and updated as appropriate: allergies, current medications, past family history, past medical history, past social history, past surgical history, and problem list.  Review of Systems Genitourinary:positive for vaginal discharge and odor    Objective:    BP (!) 150/94   Pulse 84   Wt 218 lb 3.2 oz (99 kg)   LMP 06/10/2020   BMI 38.65 kg/m  General:  alert, cooperative, and no distress  Abdomen: soft, bowel sounds active, non-tender  Incision:   N/a    Pelvic exam: normal external genitalia, vulva, vagina, cervix, uterus and adnexa, VULVA: normal appearing vulva with no masses, tenderness or lesions, VAGINA: vaginal discharge - white and milky, granulation tissue at cuff treated with AgNO3, CERVIX: normal appearing cervix without discharge or lesions, surgically absent.  Assessment:    Doing well postoperatively. Operative findings again reviewed. Pathology report discussed.    Plan:    1. Continue any current medications. 2. Wound care discussed. 3. Activity restrictions: none 4. Anticipated return to work: now. 5. Follow up:  prn.  Patient ID: Renee Collier, female   DOB: January 22, 1971, 50 y.o.   MRN: [id number]

## 2020-07-22 ENCOUNTER — Other Ambulatory Visit: Payer: Self-pay

## 2020-07-22 DIAGNOSIS — B9689 Other specified bacterial agents as the cause of diseases classified elsewhere: Secondary | ICD-10-CM

## 2020-07-22 LAB — CERVICOVAGINAL ANCILLARY ONLY
Bacterial Vaginitis (gardnerella): POSITIVE — AB
Candida Glabrata: NEGATIVE
Candida Vaginitis: NEGATIVE
Chlamydia: NEGATIVE
Comment: NEGATIVE
Comment: NEGATIVE
Comment: NEGATIVE
Comment: NEGATIVE
Comment: NEGATIVE
Comment: NORMAL
Neisseria Gonorrhea: NEGATIVE
Trichomonas: NEGATIVE

## 2020-07-22 MED ORDER — METRONIDAZOLE 500 MG PO TABS
500.0000 mg | ORAL_TABLET | Freq: Two times a day (BID) | ORAL | 0 refills | Status: DC
  Filled 2020-07-22: qty 14, 7d supply, fill #0

## 2020-07-23 ENCOUNTER — Other Ambulatory Visit: Payer: Self-pay | Admitting: Obstetrics & Gynecology

## 2020-07-23 ENCOUNTER — Other Ambulatory Visit: Payer: Self-pay

## 2020-07-23 MED FILL — Hydrochlorothiazide Tab 25 MG: ORAL | 30 days supply | Qty: 30 | Fill #0 | Status: CN

## 2020-07-30 ENCOUNTER — Other Ambulatory Visit: Payer: Self-pay

## 2020-12-22 ENCOUNTER — Other Ambulatory Visit: Payer: Self-pay

## 2020-12-22 DIAGNOSIS — Z1231 Encounter for screening mammogram for malignant neoplasm of breast: Secondary | ICD-10-CM

## 2020-12-23 ENCOUNTER — Telehealth: Payer: Self-pay

## 2020-12-23 NOTE — Telephone Encounter (Signed)
Have called pt a few time last time 12-23-2020 Trying to verfied  patient income  so patient can be schedule for [facility] appt.

## 2021-01-05 ENCOUNTER — Emergency Department (HOSPITAL_COMMUNITY): Payer: Self-pay

## 2021-01-05 ENCOUNTER — Other Ambulatory Visit: Payer: Self-pay

## 2021-01-05 ENCOUNTER — Other Ambulatory Visit: Payer: Self-pay | Admitting: Family Medicine

## 2021-01-05 ENCOUNTER — Emergency Department (HOSPITAL_COMMUNITY)
Admission: EM | Admit: 2021-01-05 | Discharge: 2021-01-05 | Disposition: A | Discharge status: Home / Self Care | Payer: Self-pay | Attending: Emergency Medicine | Admitting: Emergency Medicine

## 2021-01-05 ENCOUNTER — Ambulatory Visit: Payer: Self-pay | Admitting: *Deleted

## 2021-01-05 DIAGNOSIS — G44209 Tension-type headache, unspecified, not intractable: Secondary | ICD-10-CM

## 2021-01-05 DIAGNOSIS — I10 Essential (primary) hypertension: Secondary | ICD-10-CM

## 2021-01-05 DIAGNOSIS — E876 Hypokalemia: Secondary | ICD-10-CM | POA: Insufficient documentation

## 2021-01-05 DIAGNOSIS — Z79899 Other long term (current) drug therapy: Secondary | ICD-10-CM | POA: Insufficient documentation

## 2021-01-05 DIAGNOSIS — R519 Headache, unspecified: Secondary | ICD-10-CM | POA: Insufficient documentation

## 2021-01-05 LAB — CBC WITH DIFFERENTIAL/PLATELET
Abs Immature Granulocytes: 0.02 10*3/uL (ref 0.00–0.07)
Basophils Absolute: 0.1 10*3/uL (ref 0.0–0.1)
Basophils Relative: 1 %
Eosinophils Absolute: 0.1 10*3/uL (ref 0.0–0.5)
Eosinophils Relative: 1 %
HCT: 42.4 % (ref 36.0–46.0)
Hemoglobin: 13.3 g/dL (ref 12.0–15.0)
Immature Granulocytes: 0 %
Lymphocytes Relative: 47 %
Lymphs Abs: 4.8 10*3/uL — ABNORMAL HIGH (ref 0.7–4.0)
MCH: 29.2 pg (ref 26.0–34.0)
MCHC: 31.4 g/dL (ref 30.0–36.0)
MCV: 93.2 fL (ref 80.0–100.0)
Monocytes Absolute: 0.6 10*3/uL (ref 0.1–1.0)
Monocytes Relative: 5 %
Neutro Abs: 4.7 10*3/uL (ref 1.7–7.7)
Neutrophils Relative %: 46 %
Platelets: 395 10*3/uL (ref 150–400)
RBC: 4.55 MIL/uL (ref 3.87–5.11)
RDW: 14.5 % (ref 11.5–15.5)
WBC: 10.3 10*3/uL (ref 4.0–10.5)
nRBC: 0 % (ref 0.0–0.2)

## 2021-01-05 LAB — BASIC METABOLIC PANEL
Anion gap: 8 (ref 5–15)
BUN: 11 mg/dL (ref 6–20)
CO2: 26 mmol/L (ref 22–32)
Calcium: 9.3 mg/dL (ref 8.9–10.3)
Chloride: 103 mmol/L (ref 98–111)
Creatinine, Ser: 0.81 mg/dL (ref 0.44–1.00)
GFR, Estimated: >60 mL/min (ref >60)
Glucose, Bld: 107 mg/dL — ABNORMAL HIGH (ref 70–99)
Potassium: 3.3 mmol/L — ABNORMAL LOW (ref 3.5–5.1)
Sodium: 137 mmol/L (ref 135–145)

## 2021-01-05 LAB — TROPONIN I (HIGH SENSITIVITY)
Troponin I (High Sensitivity): 5 ng/L (ref <18)
Troponin I (High Sensitivity): 6 ng/L (ref <18)

## 2021-01-05 MED ORDER — POTASSIUM CHLORIDE CRYS ER 20 MEQ PO TBCR: 20.0000 meq | EXTENDED_RELEASE_TABLET | Freq: Every day | ORAL | 0 refills | Status: DC

## 2021-01-05 MED ORDER — HYDROCHLOROTHIAZIDE 25 MG PO TABS
25.0000 mg | ORAL_TABLET | Freq: Once | ORAL | Status: AC
  Administered 2021-01-05: 25 mg via ORAL
  Filled 2021-01-05: qty 1

## 2021-01-05 MED ORDER — ACETAMINOPHEN 500 MG PO TABS
1000.0000 mg | ORAL_TABLET | Freq: Once | ORAL | Status: AC
  Administered 2021-01-05: 1000 mg via ORAL
  Filled 2021-01-05: qty 2

## 2021-01-05 MED ORDER — POTASSIUM CHLORIDE CRYS ER 20 MEQ PO TBCR
40.0000 meq | EXTENDED_RELEASE_TABLET | Freq: Once | ORAL | Status: AC
  Administered 2021-01-05: 40 meq via ORAL
  Filled 2021-01-05: qty 2

## 2021-01-05 MED ORDER — HYDROCHLOROTHIAZIDE 25 MG PO TABS: 25.0000 mg | ORAL_TABLET | Freq: Every day | ORAL | 0 refills | Status: DC

## 2021-01-05 NOTE — Telephone Encounter (Signed)
Reason for Disposition  [8] Systolic BP  >= 786 OR Diastolic >= 767 AND [2] cardiac or neurologic symptoms (e.g., chest pain, difficulty breathing, unsteady gait, blurred vision)  Answer Assessment - Initial Assessment Questions 1. DESCRIPTION: "Describe your dizziness."     Lightheaded 2. LIGHTHEADED: "Do you feel lightheaded?" (e.g., somewhat faint, woozy, weak upon standing)     Floaty 3. VERTIGO: "Do you feel like either you or the room is spinning or tilting?" (i.e. vertigo)     no 4. SEVERITY: "How bad is it?"  "Do you feel like you are going to faint?" "Can you stand and walk?"   - MILD: Feels slightly dizzy, but walking normally.   - MODERATE: Feels unsteady when walking, but not falling; interferes with normal activities (e.g., school, work).   - SEVERE: Unable to walk without falling, or requires assistance to walk without falling; feels like passing out now.      Mild 5. ONSET:  "When did the dizziness begin?"     yesterday 6. AGGRAVATING FACTORS: "Does anything make it worse?" (e.g., standing, change in head position)      7. HEART RATE: "Can you tell me your heart rate?" "How many beats in 15 seconds? 8. CAUSE: "What do you think is causing the dizziness?"     BP high 9. RECURRENT SYMPTOM: "Have you had dizziness before?" If Yes, ask: "When was the last time?" "What happened that time?"     BP high 10. OTHER SYMPTOMS: "Do you have any other symptoms?" (e.g., fever, chest pain, vomiting, diarrhea, bleeding)       Headache, 8/10 before tylenol. CAme back  Answer Assessment - Initial Assessment Questions 1. BLOOD PRESSURE: "What is the blood pressure?" "Did you take at least two measurements 5 minutes apart?"     163/117 2. ONSET: "When did you take your blood pressure?"     *No Answer* 3. HOW: "How did you obtain the blood pressure?" (e.g., visiting nurse, automatic home BP monitor)     *No Answer* 4. HISTORY: "Do you have a history of high blood pressure?"     *No  Answer* 5. MEDICATIONS: "Are you taking any medications for blood pressure?" "Have you missed any doses recently?"     *No Answer* 6. OTHER SYMPTOMS: "Do you have any symptoms?" (e.g., headache, chest pain, blurred vision, difficulty breathing, weakness)     *No Answer* 7. PREGNANCY: "Is there any chance you are pregnant?" "When was your last menstrual period?"     *No Answer*  Protocols used: Dizziness - Lightheadedness-A-AH, Blood Pressure - High-A-AH

## 2021-01-05 NOTE — ED Provider Notes (Signed)
[place] EMERGENCY DEPARTMENT Provider Note   CSN: [id number] Arrival date & time: 01/05/21  1622     History Chief Complaint  Patient presents with   Hypertension    Renee Collier is a 50 y.o. female.  Patient has history of high blood pressure, not on medications for 3 weeks as she has been out and presents with dizziness, elevated blood pressure and frontal headache gradually worsening and intermittent since then.  No head injuries.  No neurologic signs or symptoms.  No cardiac history.  No exertional components.  Patient denies any heart attack or blood clot history, no recent surgeries or leg edema.      Past Medical History:  Diagnosis Date   Hypertension     Patient Active Problem List   Diagnosis Date Noted   Menometrorrhagia 06/10/2020   Primary osteoarthritis of left hip 10/18/2019   Vaginal discharge 12/14/2018   Status post HTA endometrial ablation 04/10/2014   Abnormal uterine bleeding (AUB) 12/18/2013    Past Surgical History:  Procedure Laterality Date   DILATION AND CURETTAGE OF UTERUS N/A 04/27/2017   Procedure: Ultrasound Guided Dilatation and Curettage;  Surgeon: [name], MD;  Location: [facility];  Service: Gynecology;  Laterality: N/A;   ENDOMETRIAL ABLATION     HYSTEROSCOPY N/A 03/12/2014   Procedure: HYSTEROSCOPY WITH HYDROTHERMAL ABLATION;  Surgeon: Osborne Oman, MD;  Location: [facility];  Service: Gynecology;  Laterality: N/A;   TUBAL LIGATION     VAGINAL HYSTERECTOMY Left 06/10/2020   Procedure: HYSTERECTOMY VAGINAL with LEFT Salpingectomy;  Surgeon: Woodroe Mode, MD;  Location: [facility];  Service: Gynecology;  Laterality: Left;     OB History     Gravida  3   Para  3   Term  3   Preterm      AB      Living  3      SAB      IAB      Ectopic      Multiple      Live Births  3           Family History  Problem Relation Age of Onset   Cancer Mother        cervical   Ovarian  cancer Sister     Social History   Tobacco Use   Smoking status: Never   Smokeless tobacco: Never  Vaping Use   Vaping Use: Never used  Substance Use Topics   Alcohol use: Yes    Comment: occ   Drug use: No    Home Medications Prior to Admission medications   Medication Sig Start Date End Date Taking? Authorizing Provider  hydrochlorothiazide (HYDRODIURIL) 25 MG tablet Take 1 tablet (25 mg total) by mouth daily. 01/05/21  Yes Elnora Morrison, MD  potassium chloride SA (KLOR-CON M) 20 MEQ tablet Take 1 tablet (20 mEq total) by mouth daily. 01/05/21  Yes Elnora Morrison, MD  cyclobenzaprine (FLEXERIL) 10 MG tablet 1/2 in the morning and afternoon prn muscle spasm and 1 at bedtime prn Patient not taking: No sig reported 08/16/19   Argentina Donovan, PA-C  gabapentin (NEURONTIN) 300 MG capsule Take 1 capsule (300 mg total) by mouth 3 (three) times daily. Prn nerve pain Patient not taking: No sig reported 03/21/19   Argentina Donovan, PA-C  hydrochlorothiazide (HYDRODIURIL) 25 MG tablet TAKE 1 TABLET (25 MG TOTAL) BY MOUTH DAILY. MUST HAVE OFFICE VISIT FOR REFILLS 01/01/20 12/31/20  Newlin,  Enobong, MD  metroNIDAZOLE (FLAGYL) 500 MG tablet Take 1 tablet (500 mg total) by mouth 2 (two) times daily. 07/22/20   Woodroe Mode, MD  oxyCODONE-acetaminophen (PERCOCET/ROXICET) 5-325 MG tablet Take 1-2 tablets by mouth every 4 (four) hours as needed for severe pain. Patient not taking: Reported on 07/18/2020 06/11/20   Woodroe Mode, MD  valACYclovir (VALTREX) 1000 MG tablet Take 1 tablet (1,000 mg total) by mouth 3 (three) times daily. Patient not taking: Reported on 07/18/2020 03/21/19   Argentina Donovan, PA-C    Allergies    Patient has no known allergies.  Review of Systems   Review of Systems  Constitutional:  Negative for chills and fever.  HENT:  Negative for congestion.   Eyes:  Negative for visual disturbance.  Respiratory:  Positive for shortness of breath.   Cardiovascular:  Positive  for chest pain.  Gastrointestinal:  Negative for abdominal pain and vomiting.  Genitourinary:  Negative for dysuria and flank pain.  Musculoskeletal:  Negative for back pain, neck pain and neck stiffness.  Skin:  Negative for rash.  Neurological:  Positive for dizziness, light-headedness and headaches.   Physical Exam Updated Vital Signs BP (!) 148/89   Pulse 84   Temp 98.6 F (37 C) (Oral)   Resp 16   LMP 06/10/2020   SpO2 100%   Physical Exam Vitals and nursing note reviewed.  Constitutional:      General: She is not in acute distress.    Appearance: She is well-developed.  HENT:     Head: Normocephalic and atraumatic.     Mouth/Throat:     Mouth: Mucous membranes are moist.  Eyes:     General:        Right eye: No discharge.        Left eye: No discharge.     Conjunctiva/sclera: Conjunctivae normal.  Neck:     Trachea: No tracheal deviation.  Cardiovascular:     Rate and Rhythm: Normal rate and regular rhythm.     Heart sounds: No murmur heard. Pulmonary:     Effort: Pulmonary effort is normal.     Breath sounds: Normal breath sounds.  Abdominal:     General: There is no distension.     Palpations: Abdomen is soft.     Tenderness: There is no abdominal tenderness. There is no guarding.  Musculoskeletal:     Cervical back: Normal range of motion and neck supple. No rigidity.  Skin:    General: Skin is warm.     Capillary Refill: Capillary refill takes less than 2 seconds.     Findings: No rash.  Neurological:     General: No focal deficit present.     Mental Status: She is alert.     Cranial Nerves: No cranial nerve deficit.     Sensory: No sensory deficit.     Motor: No weakness.     Coordination: Coordination normal.  Psychiatric:        Mood and Affect: Mood normal.    ED Results / Procedures / Treatments   Labs (all labs ordered are listed, but only abnormal results are displayed) Labs Reviewed  BASIC METABOLIC PANEL - Abnormal; Notable for the  following components:      Result Value   Potassium 3.3 (*)    Glucose, Bld 107 (*)    All other components within normal limits  CBC WITH DIFFERENTIAL/PLATELET - Abnormal; Notable for the following components:   Lymphs Abs 4.8 (*)  All other components within normal limits  TROPONIN I (HIGH SENSITIVITY)  TROPONIN I (HIGH SENSITIVITY)    EKG None  Radiology DG Chest 1 View  Result Date: 01/05/2021 CLINICAL DATA:  Dizziness. EXAM: CHEST  1 VIEW COMPARISON:  Chest radiograph dated 05/28/2014. FINDINGS: The heart size and mediastinal contours are within normal limits. Both lungs are clear. The visualized skeletal structures are unremarkable. IMPRESSION: No active disease. Electronically Signed   By: Anner Crete M.D.   On: 01/05/2021 17:53   CT Head Wo Contrast  Result Date: 01/05/2021 CLINICAL DATA:  Headache and lightheadedness. EXAM: CT HEAD WITHOUT CONTRAST TECHNIQUE: Contiguous axial images were obtained from the base of the skull through the vertex without intravenous contrast. COMPARISON:  CT head dated November 24, 2015. FINDINGS: Brain: No evidence of acute infarction, hemorrhage, hydrocephalus, extra-axial collection or mass lesion/mass effect. Unchanged partially empty sella. Vascular: No hyperdense vessel or unexpected calcification. Skull: Normal. Negative for fracture or focal lesion. Sinuses/Orbits: No acute finding. Minimal mucosal thickening in both sphenoid sinuses. Other: None. IMPRESSION: 1. No acute intracranial abnormality. Electronically Signed   By: Titus Dubin M.D.   On: 01/05/2021 18:23    Procedures Procedures   Medications Ordered in ED Medications  acetaminophen (TYLENOL) tablet 1,000 mg (has no administration in time range)  hydrochlorothiazide (HYDRODIURIL) tablet 25 mg (has no administration in time range)  potassium chloride SA (KLOR-CON M) CR tablet 40 mEq (has no administration in time range)    ED Course  I have reviewed the triage vital  signs and the nursing notes.  Pertinent labs & imaging results that were available during my care of the patient were reviewed by me and considered in my medical decision making (see chart for details).    MDM Rules/Calculators/A&P                           Patient presents with multiple symptoms and uncontrolled blood pressure for the past 3 weeks.  No focal neurologic signs or symptoms, no significant chest pain on exam and no cardiac history.  Patient does have headache on reassessment.  Blood work ordered and reviewed showing mild hypokalemia 3.3, oral potassium given especially since we will be restarting her hydrochlorothiazide.  Hydrochlorothiazide tablet given in ER and prescription for home for 1 month.  Stressed follow-up with primary doctor and oral potassium given from for 1 week.  CT of the head and chest x-ray reviewed no acute abnormalities no bleeding, no edema. Tylenol given for pain. Patient stable for discharge with primary doctor.  Final Clinical Impression(s) / ED Diagnoses Final diagnoses:  Acute non intractable tension-type headache  Primary hypertension  Hypokalemia    Rx / DC Orders ED Discharge Orders          Ordered    potassium chloride SA (KLOR-CON M) 20 MEQ tablet  Daily        01/05/21 2150    hydrochlorothiazide (HYDRODIURIL) 25 MG tablet  Daily        01/05/21 2150             Elnora Morrison, MD 01/05/21 2156

## 2021-01-05 NOTE — Discharge Instructions (Addendum)
Start taking your blood pressure medications again and follow-up closely with your primary doctor. Use Tylenol every 4 hours and ibuprofen every 6 hours needed for pain. Return for weakness, numbness or other new or concerning signs or symptoms. Have your potassium rechecked next week.

## 2021-01-05 NOTE — ED Provider Notes (Signed)
Emergency Medicine Provider Triage Evaluation Note  Renee Collier , a 50 y.o. female  was evaluated in triage.  Pt complains of high blood pressure, lightheaded, headache since yesterday. Patient has been out of her HCTZ for 3 weeks and recently began experiencing symptoms. Patient is set to see her PCP on 12/23 for refill but was directed to come to ED today.   Review of Systems  Positive: CP, SOB, weakness, lightheaded, headache Negative: N/V, abdominal pain, trouble seeing   Physical Exam  BP (!) 185/106 (BP Location: Left Arm)   Pulse 85   Temp 98.6 F (37 C) (Oral)   Resp 16   LMP 06/10/2020   SpO2 100%  Gen:   Awake, no distress   Resp:  Normal effort  MSK:   Moves extremities without difficulty  Other:  No papilledema, no neuro deficits   Medical Decision Making  Medically screening exam initiated at 5:05 PM.  Appropriate orders placed.  Renee Collier was informed that the remainder of the evaluation will be completed by another provider, this initial triage assessment does not replace that evaluation, and the importance of remaining in the ED until their evaluation is complete.     Azucena Cecil, PA-C 01/05/21 1709    Isla Pence, MD 01/05/21 1723

## 2021-01-05 NOTE — ED Triage Notes (Signed)
Pt with hx of hypertension here for med refill/eval of hypertension. Reports a headache and lightheadedness today. Takes Hctz 25mg  QD but has been out for 3 weeks and does not see PCP until 12/23.

## 2021-01-05 NOTE — Telephone Encounter (Signed)
Pt reports lightheadedness x 2 days. Headache 8/10 unrelieved with tylenol. States had BP checked at her workplace by pharmacist, "Said really high but I forget what it was."  States dizziness and headache started yesterday, one episode of vomiting. Reports has not taken her HCTZ in 3 weeks. HAd pt check BP during call at home: 163/117. Advised ED as per protocol. States will follow disposition.

## 2021-01-05 NOTE — ED Notes (Signed)
Patient verbalizes understanding of d/c instructions. Opportunities for questions and answers were provided. Pt d/c from ED and ambulated to lobby where daughter is picking her up.

## 2021-01-05 NOTE — Telephone Encounter (Signed)
Requested medications are due for refill today.  yes  Requested medications are on the active medications list.  yes  Last refill. 01/01/2020  Future visit scheduled.   yes  Notes to clinic.  Prescription written to expire 12/31/2020.

## 2021-01-06 ENCOUNTER — Other Ambulatory Visit: Payer: Self-pay

## 2021-01-06 MED ORDER — HYDROCHLOROTHIAZIDE 25 MG PO TABS
25.0000 mg | ORAL_TABLET | Freq: Every day | ORAL | 0 refills | Status: DC
  Filled 2021-01-06: qty 30, 30d supply, fill #0

## 2021-01-13 ENCOUNTER — Other Ambulatory Visit: Payer: Self-pay

## 2021-01-23 ENCOUNTER — Ambulatory Visit: Payer: BC Managed Care – PPO | Admitting: Family Medicine

## 2021-01-30 ENCOUNTER — Ambulatory Visit: Payer: BC Managed Care – PPO | Admitting: Family Medicine

## 2021-02-13 ENCOUNTER — Ambulatory Visit (INDEPENDENT_AMBULATORY_CARE_PROVIDER_SITE_OTHER)
Admission: EM | Admit: 2021-02-13 | Discharge: 2021-02-13 | Disposition: A | Discharge status: Home / Self Care | Payer: Managed Care, Other (non HMO) | Source: Home / Self Care

## 2021-02-13 ENCOUNTER — Encounter (HOSPITAL_COMMUNITY): Payer: Self-pay | Admitting: *Deleted

## 2021-02-13 ENCOUNTER — Emergency Department (HOSPITAL_COMMUNITY): Payer: Managed Care, Other (non HMO)

## 2021-02-13 ENCOUNTER — Encounter (HOSPITAL_COMMUNITY): Payer: Self-pay

## 2021-02-13 ENCOUNTER — Other Ambulatory Visit: Payer: Self-pay

## 2021-02-13 ENCOUNTER — Emergency Department (HOSPITAL_COMMUNITY)
Admission: EM | Admit: 2021-02-13 | Discharge: 2021-02-13 | Disposition: A | Discharge status: Home / Self Care | Payer: Managed Care, Other (non HMO) | Attending: Emergency Medicine | Admitting: Emergency Medicine

## 2021-02-13 DIAGNOSIS — R1011 Right upper quadrant pain: Secondary | ICD-10-CM

## 2021-02-13 DIAGNOSIS — R110 Nausea: Secondary | ICD-10-CM | POA: Insufficient documentation

## 2021-02-13 DIAGNOSIS — R109 Unspecified abdominal pain: Secondary | ICD-10-CM | POA: Insufficient documentation

## 2021-02-13 DIAGNOSIS — R1031 Right lower quadrant pain: Secondary | ICD-10-CM | POA: Diagnosis present

## 2021-02-13 DIAGNOSIS — K8020 Calculus of gallbladder without cholecystitis without obstruction: Secondary | ICD-10-CM | POA: Insufficient documentation

## 2021-02-13 LAB — URINALYSIS, ROUTINE W REFLEX MICROSCOPIC
Bilirubin Urine: NEGATIVE
Glucose, UA: NEGATIVE mg/dL
Hgb urine dipstick: NEGATIVE
Ketones, ur: NEGATIVE mg/dL
Nitrite: NEGATIVE
Protein, ur: NEGATIVE mg/dL
Specific Gravity, Urine: 1.017 (ref 1.005–1.030)
pH: 7 (ref 5.0–8.0)

## 2021-02-13 LAB — CBC
HCT: 40.3 % (ref 36.0–46.0)
Hemoglobin: 13.3 g/dL (ref 12.0–15.0)
MCH: 30 pg (ref 26.0–34.0)
MCHC: 33 g/dL (ref 30.0–36.0)
MCV: 91 fL (ref 80.0–100.0)
Platelets: 377 10*3/uL (ref 150–400)
RBC: 4.43 MIL/uL (ref 3.87–5.11)
RDW: 13.4 % (ref 11.5–15.5)
WBC: 6.3 10*3/uL (ref 4.0–10.5)
nRBC: 0 % (ref 0.0–0.2)

## 2021-02-13 LAB — COMPREHENSIVE METABOLIC PANEL
ALT: 8 U/L (ref 0–44)
AST: 13 U/L — ABNORMAL LOW (ref 15–41)
Albumin: 3.4 g/dL — ABNORMAL LOW (ref 3.5–5.0)
Alkaline Phosphatase: 88 U/L (ref 38–126)
Anion gap: 7 (ref 5–15)
BUN: 7 mg/dL (ref 6–20)
CO2: 28 mmol/L (ref 22–32)
Calcium: 9 mg/dL (ref 8.9–10.3)
Chloride: 102 mmol/L (ref 98–111)
Creatinine, Ser: 0.77 mg/dL (ref 0.44–1.00)
GFR, Estimated: >60 mL/min (ref >60)
Glucose, Bld: 97 mg/dL (ref 70–99)
Potassium: 3.2 mmol/L — ABNORMAL LOW (ref 3.5–5.1)
Sodium: 137 mmol/L (ref 135–145)
Total Bilirubin: 1 mg/dL (ref 0.3–1.2)
Total Protein: 7 g/dL (ref 6.5–8.1)

## 2021-02-13 LAB — POCT URINALYSIS DIPSTICK, ED / UC
Bilirubin Urine: NEGATIVE
Glucose, UA: NEGATIVE mg/dL
Ketones, ur: NEGATIVE mg/dL
Nitrite: NEGATIVE
Protein, ur: NEGATIVE mg/dL
Specific Gravity, Urine: 1.015 (ref 1.005–1.030)
Urobilinogen, UA: 0.2 mg/dL (ref 0.0–1.0)
pH: 7 (ref 5.0–8.0)

## 2021-02-13 LAB — I-STAT BETA HCG BLOOD, ED (MC, WL, AP ONLY): I-stat hCG, quantitative: <5 m[IU]/mL (ref <5)

## 2021-02-13 LAB — LIPASE, BLOOD: Lipase: 26 U/L (ref 11–51)

## 2021-02-13 MED ORDER — ONDANSETRON 4 MG PO TBDP
4.0000 mg | ORAL_TABLET | Freq: Once | ORAL | Status: AC
  Administered 2021-02-13: 4 mg via ORAL
  Filled 2021-02-13: qty 1

## 2021-02-13 MED ORDER — HYDROCODONE-ACETAMINOPHEN 5-325 MG PO TABS
1.0000 | ORAL_TABLET | Freq: Once | ORAL | Status: AC
  Administered 2021-02-13: 1 via ORAL
  Filled 2021-02-13: qty 1

## 2021-02-13 MED ORDER — HYDROCODONE-ACETAMINOPHEN 5-325 MG PO TABS: 1.0000 | ORAL_TABLET | Freq: Four times a day (QID) | ORAL | 0 refills | Status: DC | PRN

## 2021-02-13 NOTE — ED Provider Notes (Signed)
[facility] EMERGENCY DEPARTMENT Provider Note   CSN: [id number] Arrival date & time: 02/13/21  1001     History  Chief Complaint  Patient presents with   Flank Pain    Renee Collier is a 52 y.o. female.  Patient is a 51 yo female with pmh of hysterectomy presenting for right flank pain. Pt admits to right sided flank pain with radiation to the right upper abdomen. States pain is sharp, severe, and intermittent. Worsened when she had fried chicken. Admits to nausea without vomiting. Denies hx of nephrolithiasis, hematuria, or uti symptoms. Denies fever or chills.   The history is provided by the patient. No language interpreter was used.  Flank Pain Associated symptoms include abdominal pain. Pertinent negatives include no chest pain and no shortness of breath.      Home Medications Prior to Admission medications   Medication Sig Start Date End Date Taking? Authorizing Provider  cyclobenzaprine (FLEXERIL) 10 MG tablet 1/2 in the morning and afternoon prn muscle spasm and 1 at bedtime prn Patient not taking: No sig reported 08/16/19   Argentina Donovan, PA-C  gabapentin (NEURONTIN) 300 MG capsule Take 1 capsule (300 mg total) by mouth 3 (three) times daily. Prn nerve pain Patient not taking: No sig reported 03/21/19   Argentina Donovan, PA-C  hydrochlorothiazide (HYDRODIURIL) 25 MG tablet Take 1 tablet (25 mg total) by mouth daily. 01/06/21 01/06/22  Charlott Rakes, MD  metroNIDAZOLE (FLAGYL) 500 MG tablet Take 1 tablet (500 mg total) by mouth 2 (two) times daily. 07/22/20   Woodroe Mode, MD  oxyCODONE-acetaminophen (PERCOCET/ROXICET) 5-325 MG tablet Take 1-2 tablets by mouth every 4 (four) hours as needed for severe pain. Patient not taking: Reported on 07/18/2020 06/11/20   Woodroe Mode, MD  potassium chloride SA (KLOR-CON M) 20 MEQ tablet Take 1 tablet (20 mEq total) by mouth daily. 01/05/21   Elnora Morrison, MD  valACYclovir (VALTREX) 1000 MG tablet Take 1  tablet (1,000 mg total) by mouth 3 (three) times daily. Patient not taking: Reported on 07/18/2020 03/21/19   Argentina Donovan, PA-C      Allergies    Patient has no known allergies.    Review of Systems   Review of Systems  Constitutional:  Negative for chills and fever.  HENT:  Negative for ear pain and sore throat.   Eyes:  Negative for pain and visual disturbance.  Respiratory:  Negative for cough and shortness of breath.   Cardiovascular:  Negative for chest pain and palpitations.  Gastrointestinal:  Positive for abdominal pain and nausea. Negative for vomiting.  Genitourinary:  Positive for flank pain. Negative for dysuria and hematuria.  Musculoskeletal:  Negative for arthralgias and back pain.  Skin:  Negative for color change and rash.  Neurological:  Negative for seizures and syncope.  All other systems reviewed and are negative.  Physical Exam Updated Vital Signs BP 132/84 (BP Location: Left Arm)    Pulse 76    Temp 98.7 F (37.1 C)    Resp 16    LMP 06/10/2020    SpO2 100%  Physical Exam Vitals and nursing note reviewed.  Constitutional:      General: She is not in acute distress.    Appearance: She is well-developed.  HENT:     Head: Normocephalic and atraumatic.  Eyes:     Conjunctiva/sclera: Conjunctivae normal.  Cardiovascular:     Rate and Rhythm: Normal rate and regular rhythm.     Heart  sounds: No murmur heard. Pulmonary:     Effort: Pulmonary effort is normal. No respiratory distress.     Breath sounds: Normal breath sounds.  Abdominal:     Palpations: Abdomen is soft.     Tenderness: There is abdominal tenderness in the right upper quadrant. There is no guarding.  Musculoskeletal:        General: No swelling.     Cervical back: Neck supple.  Skin:    General: Skin is warm and dry.     Capillary Refill: Capillary refill takes less than 2 seconds.  Neurological:     Mental Status: She is alert.  Psychiatric:        Mood and Affect: Mood normal.     ED Results / Procedures / Treatments   Labs (all labs ordered are listed, but only abnormal results are displayed) Labs Reviewed  COMPREHENSIVE METABOLIC PANEL - Abnormal; Notable for the following components:      Result Value   Potassium 3.2 (*)    Albumin 3.4 (*)    AST 13 (*)    All other components within normal limits  URINALYSIS, ROUTINE W REFLEX MICROSCOPIC - Abnormal; Notable for the following components:   APPearance HAZY (*)    Leukocytes,Ua TRACE (*)    Bacteria, UA RARE (*)    All other components within normal limits  CBC  LIPASE, BLOOD  I-STAT BETA HCG BLOOD, ED ([facility], [facility], [facility] ONLY)    EKG None  Radiology CT Renal Stone Study  Result Date: 02/13/2021 CLINICAL DATA:  RIGHT flank pain, suspected kidney stone EXAM: CT ABDOMEN AND PELVIS WITHOUT CONTRAST TECHNIQUE: Multidetector CT imaging of the abdomen and pelvis was performed following the standard protocol without IV contrast. RADIATION DOSE REDUCTION: This exam was performed according to the departmental dose-optimization program which includes automated exposure control, adjustment of the mA and/or kV according to patient size and/or use of iterative reconstruction technique. COMPARISON:  06/12/2004 FINDINGS: Lower chest: Lung bases clear Hepatobiliary: Multiple gas containing calculi within gallbladder. No gross gallbladder wall thickening or surrounding infiltrative changes by CT. Liver unremarkable. Pancreas: Normal appearance Spleen: Normal appearance Adrenals/Urinary Tract: Small RIGHT renal cyst 14 mm diameter. Adrenal glands, kidneys, ureters, and bladder otherwise normal appearance. No urinary tract calcification or dilatation. Stomach/Bowel: Appendix normal. Stomach and bowel loops is unremarkable. Vascular/Lymphatic: Atherosclerotic calcification aorta without aneurysm. Reproductive: Uterus surgically absent. Small LEFT renal cyst 15 mm diameter; No follow-up imaging recommended. Note: This recommendation does  not apply to premenarchal patients and to those with increased risk (genetic, family history, elevated tumor markers or other high-risk factors) of ovarian cancer. Reference: JACR 2020 Feb; 17(2):248-254 Other: No free air free fluid. Small umbilical hernia containing fat. No inflammatory process seen. Musculoskeletal: No acute osseous findings. IMPRESSION: Cholelithiasis without evidence of acute cholecystitis. Small umbilical hernia containing fat. No urinary tract calcification or dilatation. Aortic Atherosclerosis (ICD10-I70.0). Electronically Signed   By: Lavonia Dana M.D.   On: 02/13/2021 12:27    Procedures Procedures    Medications Ordered in ED Medications  HYDROcodone-acetaminophen (NORCO/VICODIN) 5-325 MG per tablet 1 tablet (has no administration in time range)  ondansetron (ZOFRAN-ODT) disintegrating tablet 4 mg (has no administration in time range)    ED Course/ Medical Decision Making/ A&P                           Medical Decision Making  5:46 PM 51 yo female with pmh of hysterectomy presenting for  right flank pain. Pt is Aox3, no acute distress, afebrile, with stable vitals. Abdomen soft with tenderness in RUQ.   Stable labs with no leukocytosis. No s/s sepsis. Stable liver profile, lipase, and renal function.  UA demonstrates no UTI. Doubt pyelonephritis. No hematuria. Doubt nephrolithiasis.   CT renal stone ordered by triage team demonstrates no nephrolithiasis. Positive for cholelithiasis without cholecystitis. No CBD dilatation. Pt given medication for pain control and f/u instructions for general surgery if pain continues for elective surgery.  Patient in no distress and overall condition improved here in the ED. Detailed discussions were had with the patient regarding current findings, and need for close f/u with PCP or on call doctor. The patient has been instructed to return immediately if the symptoms worsen in any way for re-evaluation. Patient verbalized  understanding and is in agreement with current care plan. All questions answered prior to discharge.           Final Clinical Impression(s) / ED Diagnoses Final diagnoses:  Calculus of gallbladder without cholecystitis without obstruction    Rx / DC Orders ED Discharge Orders     None         Lianne Cure, DO 39/03/00 1749

## 2021-02-13 NOTE — Discharge Instructions (Signed)
Go to emergency room for further evaluation and management.

## 2021-02-13 NOTE — ED Notes (Signed)
Reviewed discharge instructions with patient. Follow-up care and medications reviewed. Patient  verbalized understanding. Patient A&Ox4, VSS, and ambulatory with steady gait upon discharge.  °

## 2021-02-13 NOTE — ED Triage Notes (Signed)
Pt reports right side flank pain that started on wed, radiates around to front. Hx of same pain in past, related to gallbladder. Went to ucc and sent here due to possibly needing [place]. Denies any urinary symptoms.

## 2021-02-13 NOTE — ED Triage Notes (Signed)
Pt presents with pain to the R lower back.   States she had pain in the same area 4 years ago.   States she was told it was her gallbladder. Pt reports she did not have surgery and states the pain is back.   Denies urinary issues.

## 2021-02-13 NOTE — ED Provider Triage Note (Signed)
Emergency Medicine Provider Triage Evaluation Note  Renee Collier , a 51 y.o. female  was evaluated in triage.  Pt complains of right flank pain onset Wednesday associated with nausea without vomiting.  She denies fever, chills, abdominal pain.  Denies history of kidney stones.  Endorses history of gallstones.  Denies change with p.o. intake.  Rates pain as 10/10 without improvement taking Tylenol.  Endorses dysuria.  Denies hematuria, vaginal discharge.  Review of Systems  Positive: As above Negative: As above  Physical Exam  BP (!) 163/92 (BP Location: Left Arm)    Pulse 76    Temp 98.2 F (36.8 C) (Oral)    Resp 18    LMP 06/10/2020    SpO2 100%  Gen:   Awake, no distress   Resp:  Normal effort  MSK:   Moves extremities without difficulty  Other:  Right flank tenderness.  Abdomen soft and nontender  Medical Decision Making  Medically screening exam initiated at 10:57 AM.  Appropriate orders placed.  Renee Collier was informed that the remainder of the evaluation will be completed by another provider, this initial triage assessment does not replace that evaluation, and the importance of remaining in the ED until their evaluation is complete.     Evlyn Courier, PA-C 02/13/21 1059

## 2021-02-13 NOTE — ED Provider Notes (Signed)
[place]    CSN: [id number] Arrival date & time: 02/13/21  0844      History   Chief Complaint Chief Complaint  Patient presents with   Back Pain    HPI Renee Collier is a 51 y.o. female.   Patient presents today with a 2-day history right upper quadrant abdominal pain.  She reports pain is rated 10 on a 0-10 pain scale, localized to right upper quadrant with radiation to back, described as sharp, constant, no alleviating factors identified.  She has not tried any over-the-counter medication for symptom management.  She does report associated nausea but denies any vomiting.  Denies any association with eating.  Reports she has been told that she has gallstones but did not have cholecystectomy as previously recommended.  She denies any urinary symptoms including frequency, urgency, hematuria.  Denies any pelvic pain or vaginal symptoms.  She denies any recent antibiotic use.  She reports that symptoms are interfering with her ability to perform daily duties and she cannot sleep as result of severe pain.  She denies any yellowing of her skin or eyes, confusion, lightheadedness.   Past Medical History:  Diagnosis Date   Hypertension     Patient Active Problem List   Diagnosis Date Noted   Menometrorrhagia 06/10/2020   Primary osteoarthritis of left hip 10/18/2019   Vaginal discharge 12/14/2018   Status post HTA endometrial ablation 04/10/2014   Abnormal uterine bleeding (AUB) 12/18/2013    Past Surgical History:  Procedure Laterality Date   DILATION AND CURETTAGE OF UTERUS N/A 04/27/2017   Procedure: Ultrasound Guided Dilatation and Curettage;  Surgeon: [name], MD;  Location: [facility];  Service: Gynecology;  Laterality: N/A;   ENDOMETRIAL ABLATION     HYSTEROSCOPY N/A 03/12/2014   Procedure: HYSTEROSCOPY WITH HYDROTHERMAL ABLATION;  Surgeon: Osborne Oman, MD;  Location: [facility];  Service: Gynecology;  Laterality: N/A;   TUBAL LIGATION      VAGINAL HYSTERECTOMY Left 06/10/2020   Procedure: HYSTERECTOMY VAGINAL with LEFT Salpingectomy;  Surgeon: Woodroe Mode, MD;  Location: [facility];  Service: Gynecology;  Laterality: Left;    OB History     Gravida  3   Para  3   Term  3   Preterm      AB      Living  3      SAB      IAB      Ectopic      Multiple      Live Births  3            Home Medications    Prior to Admission medications   Medication Sig Start Date End Date Taking? Authorizing Provider  cyclobenzaprine (FLEXERIL) 10 MG tablet 1/2 in the morning and afternoon prn muscle spasm and 1 at bedtime prn Patient not taking: No sig reported 08/16/19   Argentina Donovan, PA-C  gabapentin (NEURONTIN) 300 MG capsule Take 1 capsule (300 mg total) by mouth 3 (three) times daily. Prn nerve pain Patient not taking: No sig reported 03/21/19   Argentina Donovan, PA-C  hydrochlorothiazide (HYDRODIURIL) 25 MG tablet Take 1 tablet (25 mg total) by mouth daily. 01/06/21 01/06/22  Charlott Rakes, MD  metroNIDAZOLE (FLAGYL) 500 MG tablet Take 1 tablet (500 mg total) by mouth 2 (two) times daily. 07/22/20   Woodroe Mode, MD  oxyCODONE-acetaminophen (PERCOCET/ROXICET) 5-325 MG tablet Take 1-2 tablets by mouth every 4 (four) hours as needed for  severe pain. Patient not taking: Reported on 07/18/2020 06/11/20   Woodroe Mode, MD  potassium chloride SA (KLOR-CON M) 20 MEQ tablet Take 1 tablet (20 mEq total) by mouth daily. 01/05/21   Elnora Morrison, MD  valACYclovir (VALTREX) 1000 MG tablet Take 1 tablet (1,000 mg total) by mouth 3 (three) times daily. Patient not taking: Reported on 07/18/2020 03/21/19   Argentina Donovan, PA-C    Family History Family History  Problem Relation Age of Onset   Cancer Mother        cervical   Ovarian cancer Sister     Social History Social History   Tobacco Use   Smoking status: Never   Smokeless tobacco: Never  Vaping Use   Vaping Use: Never used  Substance Use Topics   Alcohol  use: Yes    Comment: occ   Drug use: No     Allergies   Patient has no known allergies.   Review of Systems Review of Systems  Constitutional:  Positive for activity change, appetite change and fatigue. Negative for fever.  Respiratory:  Negative for cough and shortness of breath.   Cardiovascular:  Negative for chest pain.  Gastrointestinal:  Positive for abdominal pain and nausea. Negative for diarrhea and vomiting.  Genitourinary:  Negative for dysuria, frequency, pelvic pain, urgency, vaginal bleeding, vaginal discharge and vaginal pain.  Neurological:  Negative for dizziness, light-headedness and headaches.    Physical Exam Triage Vital Signs ED Triage Vitals  Enc Vitals Group     BP 02/13/21 0925 (!) 150/107     Pulse Rate 02/13/21 0924 83     Resp 02/13/21 0924 17     Temp 02/13/21 0924 98.9 F (37.2 C)     Temp Source 02/13/21 0924 Oral     SpO2 02/13/21 0924 96 %     Weight --      Height --      Head Circumference --      Peak Flow --      Pain Score 02/13/21 0923 10     Pain Loc --      Pain Edu? --      Excl. in [place]? --    No data found.  Updated Vital Signs BP (!) 150/107    Pulse 83    Temp 98.9 F (37.2 C) (Oral)    Resp 17    LMP 06/10/2020    SpO2 96%   Visual Acuity Right Eye Distance:   Left Eye Distance:   Bilateral Distance:    Right Eye Near:   Left Eye Near:    Bilateral Near:     Physical Exam Vitals reviewed.  Constitutional:      General: She is awake. She is not in acute distress.    Appearance: Normal appearance. She is well-developed. She is not ill-appearing.     Comments: Very pleasant female appears stated age in no acute distress obviously uncomfortable sitting on exam table  HENT:     Head: Normocephalic and atraumatic.  Cardiovascular:     Rate and Rhythm: Normal rate and regular rhythm.     Heart sounds: Normal heart sounds, S1 normal and S2 normal. No murmur heard. Pulmonary:     Effort: Pulmonary effort is normal.      Breath sounds: Normal breath sounds. No wheezing, rhonchi or rales.     Comments: Clear to auscultation bilaterally Abdominal:     General: Bowel sounds are normal.     Palpations: Abdomen  is soft.     Tenderness: There is abdominal tenderness in the right upper quadrant. There is right CVA tenderness. There is no left CVA tenderness, guarding or rebound. Positive signs include Murphy's sign.  Psychiatric:        Behavior: Behavior is cooperative.     UC Treatments / Results  Labs (all labs ordered are listed, but only abnormal results are displayed) Labs Reviewed  POCT URINALYSIS DIPSTICK, ED / UC - Abnormal; Notable for the following components:      Result Value   Hgb urine dipstick TRACE (*)    Leukocytes,Ua SMALL (*)    All other components within normal limits  URINE CULTURE    EKG   Radiology No results found.  Procedures Procedures (including critical care time)  Medications Ordered in UC Medications - No data to display  Initial Impression / Assessment and Plan / UC Course  I have reviewed the triage vital signs and the nursing notes.  Pertinent labs & imaging results that were available during my care of the patient were reviewed by me and considered in my medical decision making (see chart for details).     Low suspicion for pyelonephritis as UA shows only small leukocyte Estrace and patient denies any recent UTI symptoms.  Concern for cholecystitis given clinical presentation and positive Murphy sign.  Discussed that we do not have imaging capabilities in urgent care and she should go to the emergency room for further evaluation and imaging.  Patient is agreeable given severity of pain (10 out of 10) will go directly to [facility], ER.  Vital signs were stable at the time of discharge and patient was safe for private transport.  Final Clinical Impressions(s) / UC Diagnoses   Final diagnoses:  RUQ abdominal pain  Nausea without vomiting  Right flank  pain     Discharge Instructions      Go to emergency room for further evaluation and management.     ED Prescriptions   None    PDMP not reviewed this encounter.   Terrilee Croak, PA-C 02/13/21 1856

## 2021-02-13 NOTE — ED Notes (Signed)
PT BP 150/107 L arm.   States she did not take her meds this AM. She takes it after a meal.

## 2021-02-15 LAB — URINE CULTURE: Culture: >=100000 — AB

## 2021-02-16 ENCOUNTER — Telehealth (HOSPITAL_COMMUNITY): Payer: Self-pay | Admitting: Emergency Medicine

## 2021-02-16 ENCOUNTER — Other Ambulatory Visit: Payer: Self-pay

## 2021-02-16 MED ORDER — NITROFURANTOIN MONOHYD MACRO 100 MG PO CAPS
100.0000 mg | ORAL_CAPSULE | Freq: Two times a day (BID) | ORAL | 0 refills | Status: DC
  Filled 2021-02-16: qty 10, 5d supply, fill #0

## 2021-02-17 ENCOUNTER — Other Ambulatory Visit: Payer: Self-pay

## 2021-03-31 ENCOUNTER — Encounter: Payer: Self-pay | Admitting: Family Medicine

## 2021-03-31 ENCOUNTER — Other Ambulatory Visit: Payer: Self-pay

## 2021-03-31 ENCOUNTER — Ambulatory Visit: Payer: Managed Care, Other (non HMO) | Attending: Family Medicine | Admitting: Family Medicine

## 2021-03-31 ENCOUNTER — Other Ambulatory Visit (HOSPITAL_COMMUNITY)
Admission: RE | Admit: 2021-03-31 | Discharge: 2021-03-31 | Disposition: A | Discharge status: Home / Self Care | Payer: Managed Care, Other (non HMO) | Source: Ambulatory Visit | Attending: Family Medicine | Admitting: Family Medicine

## 2021-03-31 VITALS — BP 158/108 | HR 80 | Ht 63.0 in | Wt 210.2 lb

## 2021-03-31 DIAGNOSIS — Z1211 Encounter for screening for malignant neoplasm of colon: Secondary | ICD-10-CM

## 2021-03-31 DIAGNOSIS — Z1231 Encounter for screening mammogram for malignant neoplasm of breast: Secondary | ICD-10-CM

## 2021-03-31 DIAGNOSIS — N898 Other specified noninflammatory disorders of vagina: Secondary | ICD-10-CM | POA: Insufficient documentation

## 2021-03-31 DIAGNOSIS — Z1159 Encounter for screening for other viral diseases: Secondary | ICD-10-CM

## 2021-03-31 DIAGNOSIS — R7303 Prediabetes: Secondary | ICD-10-CM | POA: Diagnosis not present

## 2021-03-31 DIAGNOSIS — M5432 Sciatica, left side: Secondary | ICD-10-CM

## 2021-03-31 DIAGNOSIS — I10 Essential (primary) hypertension: Secondary | ICD-10-CM

## 2021-03-31 LAB — POCT GLYCOSYLATED HEMOGLOBIN (HGB A1C): HbA1c, POC (prediabetic range): 5.8 % (ref 5.7–6.4)

## 2021-03-31 MED ORDER — CYCLOBENZAPRINE HCL 10 MG PO TABS
10.0000 mg | ORAL_TABLET | Freq: Two times a day (BID) | ORAL | 2 refills | Status: DC | PRN
  Filled 2021-03-31 – 2021-04-03 (×2): qty 60, 30d supply, fill #0
  Filled 2021-08-26: qty 60, 30d supply, fill #1

## 2021-03-31 MED ORDER — MELOXICAM 7.5 MG PO TABS
7.5000 mg | ORAL_TABLET | Freq: Every day | ORAL | 2 refills | Status: DC
  Filled 2021-03-31 – 2021-04-03 (×2): qty 30, 30d supply, fill #0
  Filled 2021-08-13: qty 30, 30d supply, fill #1
  Filled 2021-10-24: qty 30, 30d supply, fill #2

## 2021-03-31 MED ORDER — LISINOPRIL-HYDROCHLOROTHIAZIDE 10-12.5 MG PO TABS
1.0000 | ORAL_TABLET | Freq: Every day | ORAL | 1 refills | Status: DC
  Filled 2021-03-31 – 2021-04-03 (×2): qty 30, 30d supply, fill #0
  Filled 2021-08-13: qty 30, 30d supply, fill #1
  Filled 2021-10-24: qty 30, 30d supply, fill #2
  Filled 2021-11-24 – 2022-02-10 (×3): qty 30, 30d supply, fill #3

## 2021-03-31 NOTE — Progress Notes (Signed)
Subjective:  Patient ID: Renee Collier, female    DOB: April 12, 1970  Age: 51 y.o. MRN: [id number]  CC: Hypertension   HPI Renee Collier is a 51 y.o. year old female with a history of hypertension, status post vaginal hysterectomy with left salpingooophorectomy (secondary to abnormal uterine bleed)  Interval History: She complains of vaginal odor and discharge  for the last 1 week. She used Monistat with no relief.  Her L hip has been hurting at the end of the day after work. Voltaren gel provides some relief..Pain radiates down her LLE with associated shooting pain. Endorses compliance with her antihypertensive but her blood pressure is elevated. She has stopped snacking and stopped drinking sodas. She had an ED visit 6 weeks ago for gallbladder calculus after which she was subsequently discharged with recommendation to follow-up with general surgery.  She is asymptomatic at the moment. Past Medical History:  Diagnosis Date   Hypertension     Past Surgical History:  Procedure Laterality Date   DILATION AND CURETTAGE OF UTERUS N/A 04/27/2017   Procedure: Ultrasound Guided Dilatation and Curettage;  Surgeon: [name], MD;  Location: [facility];  Service: Gynecology;  Laterality: N/A;   ENDOMETRIAL ABLATION     HYSTEROSCOPY N/A 03/12/2014   Procedure: HYSTEROSCOPY WITH HYDROTHERMAL ABLATION;  Surgeon: Osborne Oman, MD;  Location: [facility];  Service: Gynecology;  Laterality: N/A;   TUBAL LIGATION     VAGINAL HYSTERECTOMY Left 06/10/2020   Procedure: HYSTERECTOMY VAGINAL with LEFT Salpingectomy;  Surgeon: Woodroe Mode, MD;  Location: [place];  Service: Gynecology;  Laterality: Left;    Family History  Problem Relation Age of Onset   Cancer Mother        cervical   Ovarian cancer Sister     No Known Allergies  Outpatient Medications Prior to Visit  Medication Sig Dispense Refill   hydrochlorothiazide (HYDRODIURIL) 25 MG tablet Take 1 tablet (25 mg total) by  mouth daily. 30 tablet 0   potassium chloride SA (KLOR-CON M) 20 MEQ tablet Take 1 tablet (20 mEq total) by mouth daily. 7 tablet 0   cyclobenzaprine (FLEXERIL) 10 MG tablet 1/2 in the morning and afternoon prn muscle spasm and 1 at bedtime prn (Patient not taking: Reported on 06/02/2020) 30 tablet 0   gabapentin (NEURONTIN) 300 MG capsule Take 1 capsule (300 mg total) by mouth 3 (three) times daily. Prn nerve pain (Patient not taking: Reported on 06/02/2020) 60 capsule 3   HYDROcodone-acetaminophen (NORCO) 5-325 MG tablet Take 1 tablet by mouth every 6 (six) hours as needed for moderate pain. (Patient not taking: Reported on 03/31/2021) 12 tablet 0   metroNIDAZOLE (FLAGYL) 500 MG tablet Take 1 tablet (500 mg total) by mouth 2 (two) times daily. (Patient not taking: Reported on 03/31/2021) 14 tablet 0   nitrofurantoin, macrocrystal-monohydrate, (MACROBID) 100 MG capsule Take 1 capsule (100 mg total) by mouth 2 (two) times daily. (Patient not taking: Reported on 03/31/2021) 10 capsule 0   oxyCODONE-acetaminophen (PERCOCET/ROXICET) 5-325 MG tablet Take 1-2 tablets by mouth every 4 (four) hours as needed for severe pain. (Patient not taking: Reported on 07/18/2020) 25 tablet 0   valACYclovir (VALTREX) 1000 MG tablet Take 1 tablet (1,000 mg total) by mouth 3 (three) times daily. (Patient not taking: Reported on 07/18/2020) 21 tablet 0   No facility-administered medications prior to visit.     ROS Review of Systems  Constitutional:  Negative for activity change, appetite change and fatigue.  HENT:  Negative for congestion, sinus pressure and sore throat.   Eyes:  Negative for visual disturbance.  Respiratory:  Negative for cough, chest tightness, shortness of breath and wheezing.   Cardiovascular:  Negative for chest pain and palpitations.  Gastrointestinal:  Negative for abdominal distention, abdominal pain and constipation.  Endocrine: Negative for polydipsia.  Genitourinary:  Positive for vaginal  discharge. Negative for dysuria and frequency.  Musculoskeletal:        See HPI  Skin:  Negative for rash.  Neurological:  Negative for tremors, light-headedness and numbness.  Hematological:  Does not bruise/bleed easily.  Psychiatric/Behavioral:  Negative for agitation and behavioral problems.    Objective:  BP (!) 158/108    Pulse 80    Ht '5\' 3"'  (1.6 m)    Wt 210 lb 3.2 oz (95.3 kg)    LMP 06/10/2020    SpO2 99%    BMI 37.24 kg/m   BP/Weight 03/31/2021 02/13/2021 1/74/9449  Systolic BP 675 916 384  Diastolic BP 665 63 993  Wt. (Lbs) 210.2 - -  BMI 37.24 - -      Physical Exam Constitutional:      Appearance: She is well-developed.  Cardiovascular:     Rate and Rhythm: Normal rate.     Heart sounds: Normal heart sounds. No murmur heard. Pulmonary:     Effort: Pulmonary effort is normal.     Breath sounds: Normal breath sounds. No wheezing or rales.  Chest:     Chest wall: No tenderness.  Abdominal:     General: Bowel sounds are normal. There is no distension.     Palpations: Abdomen is soft. There is no mass.     Tenderness: There is no abdominal tenderness.  Musculoskeletal:        General: Normal range of motion.     Right lower leg: No edema.     Left lower leg: No edema.     Comments: Positive straight leg raise on the left; negative on the right No tenderness on palpation of left hip  Neurological:     Mental Status: She is alert and oriented to person, place, and time.  Psychiatric:        Mood and Affect: Mood normal.    CMP Latest Ref Rng & Units 02/13/2021 01/05/2021 06/10/2020  Glucose 70 - 99 mg/dL 97 107(H) 102(H)  BUN 6 - 20 mg/dL '7 11 11  ' Creatinine 0.44 - 1.00 mg/dL 0.77 0.81 0.81  Sodium 135 - 145 mmol/L 137 137 138  Potassium 3.5 - 5.1 mmol/L 3.2(L) 3.3(L) 3.2(L)  Chloride 98 - 111 mmol/L 102 103 102  CO2 22 - 32 mmol/L '28 26 28  ' Calcium 8.9 - 10.3 mg/dL 9.0 9.3 9.0  Total Protein 6.5 - 8.1 g/dL 7.0 - -  Total Bilirubin 0.3 - 1.2 mg/dL 1.0 - -   Alkaline Phos 38 - 126 U/L 88 - -  AST 15 - 41 U/L 13(L) - -  ALT 0 - 44 U/L 8 - -    Lipid Panel     Component Value Date/Time   CHOL 163 01/01/2020 1414   TRIG 142 01/01/2020 1414   HDL 51 01/01/2020 1414   CHOLHDL 3.2 01/01/2020 1414   CHOLHDL 2.4 Ratio 11/23/2006 2033   VLDL 20 11/23/2006 2033   LDLCALC 87 01/01/2020 1414    CBC    Component Value Date/Time   WBC 6.3 02/13/2021 1100   RBC 4.43 02/13/2021 1100   HGB 13.3 02/13/2021 1100  HGB 13.5 04/04/2020 1054   HCT 40.3 02/13/2021 1100   HCT 41.4 04/04/2020 1054   PLT 377 02/13/2021 1100   PLT 371 04/04/2020 1054   MCV 91.0 02/13/2021 1100   MCV 89 04/04/2020 1054   MCH 30.0 02/13/2021 1100   MCHC 33.0 02/13/2021 1100   RDW 13.4 02/13/2021 1100   RDW 14.2 04/04/2020 1054   LYMPHSABS 4.8 (H) 01/05/2021 1754   LYMPHSABS 3.2 (H) 01/01/2020 1414   MONOABS 0.6 01/05/2021 1754   EOSABS 0.1 01/05/2021 1754   EOSABS 0.1 01/01/2020 1414   BASOSABS 0.1 01/05/2021 1754   BASOSABS 0.1 01/01/2020 1414    Lab Results  Component Value Date   HGBA1C 5.8 03/31/2021    Assessment & Plan:  1. Vaginal discharge - Cervicovaginal ancillary only  2. Prediabetes Stable with A1c of 5.8 Continue with a diabetic diet, exercise to prevent progression to type 2 diabetes mellitus - LP+Non-HDL Cholesterol - CMP14+EGFR - CBC with Differential/Platelet - POCT glycosylated hemoglobin (Hb A1C)  3. Screening for colon cancer - Ambulatory referral to Gastroenterology  4. Hypertension, unspecified type Uncontrolled Switch from hydrochlorothiazide to lisinopril/HCTZ Counseled on blood pressure goal of less than 130/80, low-sodium, DASH diet, medication compliance, 150 minutes of moderate intensity exercise per week. Discussed medication compliance, adverse effects. - lisinopril-hydrochlorothiazide (ZESTORETIC) 10-12.5 MG tablet; Take 1 tablet by mouth daily.  Dispense: 90 tablet; Refill: 1  5. Need for hepatitis C screening  test - HCV Ab w Reflex to Quant PCR  6. Encounter for screening mammogram for malignant neoplasm of breast - MM 3D SCREEN BREAST BILATERAL; Future  7. Left sciatic nerve pain Demonstrated stretching exercises We will place her on an NSAID and muscle relaxer If symptoms persist consider PT referral. - meloxicam (MOBIC) 7.5 MG tablet; Take 1 tablet (7.5 mg total) by mouth daily.  Dispense: 30 tablet; Refill: 2 - cyclobenzaprine (FLEXERIL) 10 MG tablet; Take 1 tablet (10 mg total) by mouth 2 (two) times daily as needed for muscle spasms.  Dispense: 60 tablet; Refill: 2   Meds ordered this encounter  Medications   lisinopril-hydrochlorothiazide (ZESTORETIC) 10-12.5 MG tablet    Sig: Take 1 tablet by mouth daily.    Dispense:  90 tablet    Refill:  1    Discontinue potassium and HCTZ   meloxicam (MOBIC) 7.5 MG tablet    Sig: Take 1 tablet (7.5 mg total) by mouth daily.    Dispense:  30 tablet    Refill:  2   cyclobenzaprine (FLEXERIL) 10 MG tablet    Sig: Take 1 tablet (10 mg total) by mouth 2 (two) times daily as needed for muscle spasms.    Dispense:  60 tablet    Refill:  2    Follow-up: Return in about 3 months (around 06/28/2021).       Charlott Rakes, MD, FAAFP. [facility] [place], [place]   03/31/2021, 12:24 PM

## 2021-03-31 NOTE — Progress Notes (Signed)
Vaginal discharge with odor. Left hip pain. Needs medication refills. Fasting no medication this AM

## 2021-03-31 NOTE — Patient Instructions (Signed)
Sciatica Sciatica is pain, numbness, weakness, or tingling along the path of the sciatic nerve. The sciatic nerve starts in the lower back and runs down the back of each leg. The nerve controls the muscles in the lower leg and in the back of the knee. It also provides feeling (sensation) to the back of the thigh, the lower leg, and the sole of the foot. Sciatica is a symptom of another medical condition that pinches or puts pressure on the sciatic nerve. Sciatica most often only affects one side of the body. Sciatica usually goes away on its own or with treatment. In some cases, sciatica may come back (recur). What are the causes? This condition is caused by pressure on the sciatic nerve or pinching of the nerve. This may be the result of: A disk in between the bones of the spine bulging out too far (herniated disk). Age-related changes in the spinal disks. A pain disorder that affects a muscle in the buttock. Extra bone growth near the sciatic nerve. A break (fracture) of the pelvis. Pregnancy. Tumor. This is rare. What increases the risk? The following factors may make you more likely to develop this condition: Playing sports that place pressure or stress on the spine. Having poor strength and flexibility. A history of back injury or surgery. Sitting for long periods of time. Doing activities that involve repetitive bending or lifting. Obesity. What are the signs or symptoms? Symptoms can vary from mild to very severe, and they may include: Any of these problems in the lower back, leg, hip, or buttock: Mild tingling, numbness, or dull aches. Burning sensations. Sharp pains. Numbness in the back of the calf or the sole of the foot. Leg weakness. Severe back pain that makes movement difficult. Symptoms may get worse when you cough, sneeze, or laugh, or when you sit or stand for long periods of time. How is this diagnosed? This condition may be diagnosed based on: Your symptoms and  medical history. A physical exam. Blood tests. Imaging tests, such as: X-rays. MRI. CT scan. How is this treated? In many cases, this condition improves on its own without treatment. However, treatment may include: Reducing or modifying physical activity. Exercising and stretching. Icing and applying heat to the affected area. Medicines that help to: Relieve pain and swelling. Relax your muscles. Injections of medicines that help to relieve pain, irritation, and inflammation around the sciatic nerve (steroids). Surgery. Follow these instructions at home: Medicines Take over-the-counter and prescription medicines only as told by your health care provider. Ask your health care provider if the medicine prescribed to you: Requires you to avoid driving or using heavy machinery. Can cause constipation. You may need to take these actions to prevent or treat constipation: Drink enough fluid to keep your urine pale yellow. Take over-the-counter or prescription medicines. Eat foods that are high in fiber, such as beans, whole grains, and fresh fruits and vegetables. Limit foods that are high in fat and processed sugars, such as fried or sweet foods. Managing pain   If directed, put ice on the affected area. Put ice in a plastic bag. Place a towel between your skin and the bag. Leave the ice on for 20 minutes, 2-3 times a day. If directed, apply heat to the affected area. Use the heat source that your health care provider recommends, such as a moist heat pack or a heating pad. Place a towel between your skin and the heat source. Leave the heat on for 20-30 minutes. Remove   the heat if your skin turns bright red. This is especially important if you are unable to feel pain, heat, or cold. You may have a greater risk of getting burned. Activity  Return to your normal activities as told by your health care provider. Ask your health care provider what activities are safe for you. Avoid  activities that make your symptoms worse. Take brief periods of rest throughout the day. When you rest for longer periods, mix in some mild activity or stretching between periods of rest. This will help to prevent stiffness and pain. Avoid sitting for long periods of time without moving. Get up and move around at least one time each hour. Exercise and stretch regularly, as told by your health care provider. Do not lift anything that is heavier than 10 lb (4.5 kg) while you have symptoms of sciatica. When you do not have symptoms, you should still avoid heavy lifting, especially repetitive heavy lifting. When you lift objects, always use proper lifting technique, which includes: Bending your knees. Keeping the load close to your body. Avoiding twisting. General instructions Maintain a healthy weight. Excess weight puts extra stress on your back. Wear supportive, comfortable shoes. Avoid wearing high heels. Avoid sleeping on a mattress that is too soft or too hard. A mattress that is firm enough to support your back when you sleep may help to reduce your pain. Keep all follow-up visits as told by your health care provider. This is important. Contact a health care provider if: You have pain that: Wakes you up when you are sleeping. Gets worse when you lie down. Is worse than you have experienced in the past. Lasts longer than 4 weeks. You have an unexplained weight loss. Get help right away if: You are not able to control when you urinate or have bowel movements (incontinence). You have: Weakness in your lower back, pelvis, buttocks, or legs that gets worse. Redness or swelling of your back. A burning sensation when you urinate. Summary Sciatica is pain, numbness, weakness, or tingling along the path of the sciatic nerve. This condition is caused by pressure on the sciatic nerve or pinching of the nerve. Sciatica can cause pain, numbness, or tingling in the lower back, legs, hips, and  buttocks. Treatment often includes rest, exercise, medicines, and applying ice or heat. This information is not intended to replace advice given to you by your health care provider. Make sure you discuss any questions you have with your health care provider. Document Revised: 02/06/2018 Document Reviewed: 02/06/2018 Elsevier Patient Education  2022 [employer]  

## 2021-04-01 LAB — CERVICOVAGINAL ANCILLARY ONLY
Bacterial Vaginitis (gardnerella): NEGATIVE
Candida Glabrata: NEGATIVE
Candida Vaginitis: NEGATIVE
Chlamydia: NEGATIVE
Comment: NEGATIVE
Comment: NEGATIVE
Comment: NEGATIVE
Comment: NEGATIVE
Comment: NEGATIVE
Comment: NORMAL
Neisseria Gonorrhea: NEGATIVE
Trichomonas: NEGATIVE

## 2021-04-03 ENCOUNTER — Other Ambulatory Visit: Payer: Self-pay

## 2021-04-04 LAB — CMP14+EGFR
ALT: 6 IU/L (ref 0–32)
AST: 8 IU/L (ref 0–40)
Albumin/Globulin Ratio: 1.4 (ref 1.2–2.2)
Albumin: 4.3 g/dL (ref 3.8–4.8)
Alkaline Phosphatase: 113 IU/L (ref 44–121)
BUN/Creatinine Ratio: 12 (ref 9–23)
BUN: 9 mg/dL (ref 6–24)
Bilirubin Total: 0.3 mg/dL (ref 0.0–1.2)
CO2: 24 mmol/L (ref 20–29)
Calcium: 9.6 mg/dL (ref 8.7–10.2)
Chloride: 106 mmol/L (ref 96–106)
Creatinine, Ser: 0.77 mg/dL (ref 0.57–1.00)
Globulin, Total: 3.1 g/dL (ref 1.5–4.5)
Glucose: 98 mg/dL (ref 70–99)
Potassium: 4.2 mmol/L (ref 3.5–5.2)
Sodium: 138 mmol/L (ref 134–144)
Total Protein: 7.4 g/dL (ref 6.0–8.5)
eGFR: 95 mL/min/{1.73_m2} (ref >59)

## 2021-04-04 LAB — CBC WITH DIFFERENTIAL/PLATELET
Basophils Absolute: 0 10*3/uL (ref 0.0–0.2)
Basos: 1 %
EOS (ABSOLUTE): 0.1 10*3/uL (ref 0.0–0.4)
Eos: 1 %
Hematocrit: 41.2 % (ref 34.0–46.6)
Hemoglobin: 13.5 g/dL (ref 11.1–15.9)
Immature Grans (Abs): 0 10*3/uL (ref 0.0–0.1)
Immature Granulocytes: 0 %
Lymphocytes Absolute: 3.5 10*3/uL — ABNORMAL HIGH (ref 0.7–3.1)
Lymphs: 54 %
MCH: 29.5 pg (ref 26.6–33.0)
MCHC: 32.8 g/dL (ref 31.5–35.7)
MCV: 90 fL (ref 79–97)
Monocytes Absolute: 0.3 10*3/uL (ref 0.1–0.9)
Monocytes: 5 %
Neutrophils Absolute: 2.5 10*3/uL (ref 1.4–7.0)
Neutrophils: 39 %
Platelets: 387 10*3/uL (ref 150–450)
RBC: 4.57 x10E6/uL (ref 3.77–5.28)
RDW: 14.1 % (ref 11.7–15.4)
WBC: 6.4 10*3/uL (ref 3.4–10.8)

## 2021-04-04 LAB — LP+NON-HDL CHOLESTEROL
Cholesterol, Total: 149 mg/dL (ref 100–199)
HDL: 51 mg/dL (ref >39)
LDL Chol Calc (NIH): 81 mg/dL (ref 0–99)
Total Non-HDL-Chol (LDL+VLDL): 98 mg/dL (ref 0–129)
Triglycerides: 91 mg/dL (ref 0–149)
VLDL Cholesterol Cal: 17 mg/dL (ref 5–40)

## 2021-04-04 LAB — HCV INTERPRETATION

## 2021-04-04 LAB — HCV AB W REFLEX TO QUANT PCR: HCV Ab: NONREACTIVE

## 2021-04-06 ENCOUNTER — Other Ambulatory Visit: Payer: Self-pay

## 2021-04-07 ENCOUNTER — Ambulatory Visit
Admission: RE | Admit: 2021-04-07 | Discharge: 2021-04-07 | Disposition: A | Discharge status: Home / Self Care | Payer: Managed Care, Other (non HMO) | Source: Ambulatory Visit | Attending: Family Medicine | Admitting: Family Medicine

## 2021-04-07 DIAGNOSIS — Z1231 Encounter for screening mammogram for malignant neoplasm of breast: Secondary | ICD-10-CM

## 2021-04-22 ENCOUNTER — Encounter: Payer: Self-pay | Admitting: Internal Medicine

## 2021-05-06 ENCOUNTER — Other Ambulatory Visit: Payer: Self-pay

## 2021-05-06 ENCOUNTER — Ambulatory Visit (AMBULATORY_SURGERY_CENTER): Payer: Managed Care, Other (non HMO) | Admitting: *Deleted

## 2021-05-06 VITALS — Ht 63.0 in | Wt 209.0 lb

## 2021-05-06 DIAGNOSIS — Z1211 Encounter for screening for malignant neoplasm of colon: Secondary | ICD-10-CM

## 2021-05-06 MED ORDER — PLENVU 140 G PO SOLR
1.0000 | Freq: Once | ORAL | 0 refills | Status: AC
  Filled 2021-05-06 – 2021-05-15 (×5): qty 1, 1d supply, fill #0

## 2021-05-06 NOTE — Progress Notes (Signed)
Pt's previsit is done over the phone and all paperwork (prep instructions, blank consent form to just read over) sent to patient via [facility]. Pt's name and DOB verified at the beginning of the previsit.   ?Pt denies any difficulty with ambulating.  ? ? ?No trouble with anesthesia, denies being told they were difficult to intubate, or hx/fam hx of malignant hyperthermia per pt ? ? ?No egg or soy allergy ? ?No home oxygen use  ? ?No medications for weight loss taken ? ?emmi information given ? ?Pt denies constipation issues ? ?Pt informed that we do not do prior authorizations for prep ?Plenvu coupon put in RX ? ?

## 2021-05-07 ENCOUNTER — Other Ambulatory Visit: Payer: Self-pay

## 2021-05-14 ENCOUNTER — Other Ambulatory Visit: Payer: Self-pay

## 2021-05-15 ENCOUNTER — Other Ambulatory Visit: Payer: Self-pay

## 2021-05-19 ENCOUNTER — Encounter: Payer: Self-pay | Admitting: Internal Medicine

## 2021-05-27 ENCOUNTER — Encounter: Payer: Self-pay | Admitting: Internal Medicine

## 2021-05-27 ENCOUNTER — Ambulatory Visit (AMBULATORY_SURGERY_CENTER): Payer: Managed Care, Other (non HMO) | Admitting: Internal Medicine

## 2021-05-27 VITALS — BP 173/115 | HR 89 | Temp 96.4°F | Resp 16 | Ht 63.0 in | Wt 209.0 lb

## 2021-05-27 DIAGNOSIS — K633 Ulcer of intestine: Secondary | ICD-10-CM

## 2021-05-27 DIAGNOSIS — D128 Benign neoplasm of rectum: Secondary | ICD-10-CM

## 2021-05-27 DIAGNOSIS — Z1211 Encounter for screening for malignant neoplasm of colon: Secondary | ICD-10-CM

## 2021-05-27 DIAGNOSIS — D122 Benign neoplasm of ascending colon: Secondary | ICD-10-CM

## 2021-05-27 DIAGNOSIS — D120 Benign neoplasm of cecum: Secondary | ICD-10-CM | POA: Diagnosis not present

## 2021-05-27 DIAGNOSIS — K5190 Ulcerative colitis, unspecified, without complications: Secondary | ICD-10-CM | POA: Diagnosis not present

## 2021-05-27 DIAGNOSIS — K5140 Inflammatory polyps of colon without complications: Secondary | ICD-10-CM | POA: Diagnosis not present

## 2021-05-27 MED ORDER — SODIUM CHLORIDE 0.9 % IV SOLN: 500.0000 mL | Freq: Once | INTRAVENOUS | Status: DC

## 2021-05-27 NOTE — Progress Notes (Signed)
To pacu, VSS. Report to [email or web address] 

## 2021-05-27 NOTE — Progress Notes (Signed)
? ?GASTROENTEROLOGY PROCEDURE H&P NOTE  ? ?Primary Care Physician: ?Charlott Rakes, MD ? ? ? ?Reason for Procedure:   Colon cancer screening ? ?Plan:    Colonoscopy ? ?Patient is appropriate for endoscopic procedure(s) in the ambulatory ([facility]) setting. ? ?The nature of the procedure, as well as the risks, benefits, and alternatives were carefully and thoroughly reviewed with the patient. Ample time for discussion and questions allowed. The patient understood, was satisfied, and agreed to proceed.  ? ? ? ?HPI: ?Renee Collier is a 51 y.o. female who presents for colonoscopy for colon cancer screening. Denies blood in stools, changes in bowel habits, weight loss. Denies fam hx of colon cancer.  ? ?Past Medical History:  ?Diagnosis Date  ? Arthritis   ? hips  ? Hypertension   ? ? ?Past Surgical History:  ?Procedure Laterality Date  ? DILATION AND CURETTAGE OF UTERUS N/A 04/27/2017  ? Procedure: Ultrasound Guided Dilatation and Curettage;  Surgeon: [name], MD;  Location: [facility];  Service: Gynecology;  Laterality: N/A;  ? ENDOMETRIAL ABLATION    ? HYSTEROSCOPY N/A 03/12/2014  ? Procedure: HYSTEROSCOPY WITH HYDROTHERMAL ABLATION;  Surgeon: Osborne Oman, MD;  Location: [facility];  Service: Gynecology;  Laterality: N/A;  ? TUBAL LIGATION    ? VAGINAL HYSTERECTOMY Left 06/10/2020  ? Procedure: HYSTERECTOMY VAGINAL with LEFT Salpingectomy;  Surgeon: Woodroe Mode, MD;  Location: [facility];  Service: Gynecology;  Laterality: Left;  ? ? ?Prior to Admission medications   ?Medication Sig Start Date End Date Taking? Authorizing Provider  ?cyclobenzaprine (FLEXERIL) 10 MG tablet Take 1 tablet (10 mg total) by mouth 2 (two) times daily as needed for muscle spasms. 03/31/21   Charlott Rakes, MD  ?lisinopril-hydrochlorothiazide (ZESTORETIC) 10-12.5 MG tablet Take 1 tablet by mouth daily. 03/31/21   Charlott Rakes, MD  ?meloxicam (MOBIC) 7.5 MG tablet Take 1 tablet (7.5 mg total) by mouth daily. 03/31/21   Charlott Rakes, MD  ? ? ?Current Outpatient Medications  ?Medication Sig Dispense Refill  ? cyclobenzaprine (FLEXERIL) 10 MG tablet Take 1 tablet (10 mg total) by mouth 2 (two) times daily as needed for muscle spasms. 60 tablet 2  ? lisinopril-hydrochlorothiazide (ZESTORETIC) 10-12.5 MG tablet Take 1 tablet by mouth daily. 90 tablet 1  ? meloxicam (MOBIC) 7.5 MG tablet Take 1 tablet (7.5 mg total) by mouth daily. 30 tablet 2  ? ?No current facility-administered medications for this visit.  ? ? ?Allergies as of 05/27/2021  ? (No Known Allergies)  ? ? ?Family History  ?Problem Relation Age of Onset  ? Cancer Mother   ?     cervical  ? Ovarian cancer Sister   ? Colon cancer Neg Hx   ? Esophageal cancer Neg Hx   ? Rectal cancer Neg Hx   ? Stomach cancer Neg Hx   ? ? ?Social History  ? ?Socioeconomic History  ? Marital status: Single  ?  Spouse name: Not on file  ? Number of children: Not on file  ? Years of education: Not on file  ? Highest education level: 12th grade  ?Occupational History  ? Not on file  ?Tobacco Use  ? Smoking status: Never  ? Smokeless tobacco: Never  ?Vaping Use  ? Vaping Use: Never used  ?Substance and Sexual Activity  ? Alcohol use: Yes  ?  Comment: occ  ? Drug use: No  ? Sexual activity: Not Currently  ?  Birth control/protection: Surgical  ?Other Topics Concern  ?  Not on file  ?Social History Narrative  ? Not on file  ? ?Social Determinants of Health  ? ?Financial Resource Strain: Not on file  ?Food Insecurity: Not on file  ?Transportation Needs: Not on file  ?Physical Activity: Not on file  ?Stress: Not on file  ?Social Connections: Not on file  ?Intimate Partner Violence: Not on file  ? ? ?Physical Exam: ?Vital signs in last 24 hours: ?LMP 06/10/2020  ?GEN: NAD ?EYE: Sclerae anicteric ?ENT: MMM ?CV: Non-tachycardic ?Pulm: No increased work of breathing ?GI: Soft, NT/ND ?NEURO:  Alert & Oriented ? ? ?Christia Reading, MD ?[facility] ? ?05/27/2021 10:58 AM ? ?

## 2021-05-27 NOTE — Progress Notes (Signed)
Pt's states no medical or surgical changes since previsit or office visit. 

## 2021-05-27 NOTE — Op Note (Signed)
[facility] ?Patient Name: Renee Collier ?Procedure Date: 05/27/2021 11:28 AM ?MRN: [id number] ?Endoscopist: Sonny Masters "Christia Reading ,  ?Age: 51 ?Referring MD:  ?Date of Birth: 1970-11-13 ?Gender: Female ?Account #: [id number] ?Procedure:                Colonoscopy ?Indications:              Screening for colorectal malignant neoplasm, This  ?                          is the patient's first colonoscopy ?Medicines:                Monitored Anesthesia Care ?Procedure:                Pre-Anesthesia Assessment: ?                          - Prior to the procedure, a History and Physical  ?                          was performed, and patient medications and  ?                          allergies were reviewed. The patient's tolerance of  ?                          previous anesthesia was also reviewed. The risks  ?                          and benefits of the procedure and the sedation  ?                          options and risks were discussed with the patient.  ?                          All questions were answered, and informed consent  ?                          was obtained. Prior Anticoagulants: The patient has  ?                          taken no previous anticoagulant or antiplatelet  ?                          agents. ASA Grade Assessment: II - A patient with  ?                          mild systemic disease. After reviewing the risks  ?                          and benefits, the patient was deemed in  ?                          satisfactory condition to undergo the procedure. ?  After obtaining informed consent, the colonoscope  ?                          was passed under direct vision. Throughout the  ?                          procedure, the patient's blood pressure, pulse, and  ?                          oxygen saturations were monitored continuously. The  ?                          Olympus [id number] was introduced through the anus  ?                          and advanced to the  the terminal ileum. The  ?                          colonoscopy was performed without difficulty. The  ?                          patient tolerated the procedure well. The quality  ?                          of the bowel preparation was good. The terminal  ?                          ileum, ileocecal valve, appendiceal orifice, and  ?                          rectum were photographed. ?Scope In: 11:33:19 AM ?Scope Out: 11:57:30 AM ?Scope Withdrawal Time: 0 hours 18 minutes 51 seconds  ?Total Procedure Duration: 0 hours 24 minutes 11 seconds  ?Findings:                 The terminal ileum contained multiple scattered  ?                          non-bleeding erosions. No stigmata of recent  ?                          bleeding were seen. Biopsies were taken with a cold  ?                          forceps for histology. ?                          A 7 mm polyp was found in the ileocecal valve. The  ?                          polyp was sessile. The polyp was removed with a  ?                          cold snare. Resection and retrieval were complete. ?  A 2 mm polyp was found in the ascending colon. The  ?                          polyp was sessile. The polyp was removed with a  ?                          cold biopsy forceps. Resection and retrieval were  ?                          complete. ?                          A 10 mm polyp was found in the rectum. The polyp  ?                          was sessile. The polyp was removed with a cold  ?                          snare. Resection and retrieval were complete. ?                          Non-bleeding internal hemorrhoids were found during  ?                          retroflexion. ?Complications:            No immediate complications. ?Estimated Blood Loss:     Estimated blood loss was minimal. ?Impression:               - Multiple erosions in the terminal ileum. Biopsied. ?                          - One 7 mm polyp at the ileocecal valve, removed  ?                           with a cold snare. Resected and retrieved. ?                          - One 2 mm polyp in the ascending colon, removed  ?                          with a cold biopsy forceps. Resected and retrieved. ?                          - One 10 mm polyp in the rectum, removed with a  ?                          cold snare. Resected and retrieved. ?                          - Non-bleeding internal hemorrhoids. ?Recommendation:           - Discharge patient to home (with escort). ?                          -  Await pathology results. ?                          - The findings and recommendations were discussed  ?                          with the patient. ?Georgian Co,  ?05/27/2021 12:03:51 PM ?

## 2021-05-27 NOTE — Patient Instructions (Signed)
Handouts given for polyps.  Await pathology results.  ? ? ?YOU HAD AN ENDOSCOPIC PROCEDURE TODAY AT [facility]:   Refer to the procedure report that was given to you for any specific questions about what was found during the examination.  If the procedure report does not answer your questions, please call your gastroenterologist to clarify.  If you requested that your care partner not be given the details of your procedure findings, then the procedure report has been included in a sealed envelope for you to review at your convenience later. ? ?YOU SHOULD EXPECT: Some feelings of bloating in the abdomen. Passage of more gas than usual.  Walking can help get rid of the air that was put into your GI tract during the procedure and reduce the bloating. If you had a lower endoscopy (such as a colonoscopy or flexible sigmoidoscopy) you may notice spotting of blood in your stool or on the toilet paper. If you underwent a bowel prep for your procedure, you may not have a normal bowel movement for a few days. ? ?Please Note:  You might notice some irritation and congestion in your nose or some drainage.  This is from the oxygen used during your procedure.  There is no need for concern and it should clear up in a day or so. ? ?SYMPTOMS TO REPORT IMMEDIATELY: ? ?Following lower endoscopy (colonoscopy or flexible sigmoidoscopy): ? Excessive amounts of blood in the stool ? Significant tenderness or worsening of abdominal pains ? Swelling of the abdomen that is new, acute ? Fever of 100?F or higher ?For urgent or emergent issues, a gastroenterologist can be reached at any hour by calling [phone]. ?Do not use MyChart messaging for urgent concerns.  ? ? ?DIET:  We do recommend a small meal at first, but then you may proceed to your regular diet.  Drink plenty of fluids but you should avoid alcoholic beverages for 24 hours. ? ?ACTIVITY:  You should plan to take it easy for the rest of today and you should NOT  DRIVE or use heavy machinery until tomorrow (because of the sedation medicines used during the test).   ? ?FOLLOW UP: ?Our staff will call the number listed on your records 48-72 hours following your procedure to check on you and address any questions or concerns that you may have regarding the information given to you following your procedure. If we do not reach you, we will leave a message.  We will attempt to reach you two times.  During this call, we will ask if you have developed any symptoms of COVID 19. If you develop any symptoms (ie: fever, flu-like symptoms, shortness of breath, cough etc.) before then, please call [phone].  If you test positive for Covid 19 in the 2 weeks post procedure, please call and report this information to [place].   ? ?If any biopsies were taken you will be contacted by phone or by letter within the next 1-3 weeks.  Please call us at [phone] if you have not heard about the biopsies in 3 weeks.  ? ? ?SIGNATURES/CONFIDENTIALITY: ?You and/or your care partner have signed paperwork which will be entered into your electronic medical record.  These signatures attest to the fact that that the information above on your After Visit Summary has been reviewed and is understood.  Full responsibility of the confidentiality of this discharge information lies with you and/or your care-partner.  ?

## 2021-05-27 NOTE — Progress Notes (Signed)
Patient's BP elevated, CRNA and Dr. Lorenso Courier aware.  Patient states she will take BP meds as soon as she gets in the car.  States her daughter has a BP cuff at home and will recheck it and call PMD with results.  Dr. Lorenso Courier states ok to discharge to home. ?

## 2021-05-27 NOTE — Progress Notes (Signed)
Called to room to assist during endoscopic procedure.  Patient ID and intended procedure confirmed with present staff. Received instructions for my participation in the procedure from the performing physician.  

## 2021-05-29 ENCOUNTER — Encounter: Payer: Self-pay | Admitting: Internal Medicine

## 2021-05-29 ENCOUNTER — Telehealth: Payer: Self-pay

## 2021-05-29 NOTE — Telephone Encounter (Signed)
?  Follow up Call- ? ? ?  05/27/2021  ? 11:07 AM  ?Call back number  ?Post procedure Call Back phone  # [phone]  ?Permission to leave phone message No  ?  ? ?Patient questions: ? ?Do you have a fever, pain , or abdominal swelling? No. ?Pain Score  0 * ? ?Have you tolerated food without any problems? Yes.   ? ?Have you been able to return to your normal activities? Yes.   ? ?Do you have any questions about your discharge instructions: ?Diet   No. ?Medications  No. ?Follow up visit  No. ? ?Do you have questions or concerns about your Care? No. ? ?Actions: ?* If pain score is 4 or above: ?No action needed, pain <4. ? ? ?

## 2021-07-28 ENCOUNTER — Ambulatory Visit (HOSPITAL_COMMUNITY)
Admission: EM | Admit: 2021-07-28 | Discharge: 2021-07-28 | Disposition: A | Discharge status: Home / Self Care | Payer: Managed Care, Other (non HMO) | Attending: Internal Medicine | Admitting: Internal Medicine

## 2021-07-28 ENCOUNTER — Other Ambulatory Visit: Payer: Self-pay

## 2021-07-28 ENCOUNTER — Encounter (HOSPITAL_COMMUNITY): Payer: Self-pay

## 2021-07-28 DIAGNOSIS — R0789 Other chest pain: Secondary | ICD-10-CM

## 2021-07-28 DIAGNOSIS — B349 Viral infection, unspecified: Secondary | ICD-10-CM | POA: Diagnosis not present

## 2021-07-28 LAB — POCT RAPID STREP A, ED / UC: Streptococcus, Group A Screen (Direct): NEGATIVE

## 2021-07-28 MED ORDER — PROMETHAZINE-DM 6.25-15 MG/5ML PO SYRP
5.0000 mL | ORAL_SOLUTION | Freq: Four times a day (QID) | ORAL | 0 refills | Status: DC | PRN
  Filled 2021-07-28: qty 118, 6d supply, fill #0

## 2021-07-28 MED ORDER — PREDNISONE 20 MG PO TABS
40.0000 mg | ORAL_TABLET | Freq: Every day | ORAL | 0 refills | Status: AC
  Filled 2021-07-28: qty 10, 5d supply, fill #0

## 2021-07-28 MED ORDER — ALBUTEROL SULFATE HFA 108 (90 BASE) MCG/ACT IN AERS
1.0000 | INHALATION_SPRAY | Freq: Four times a day (QID) | RESPIRATORY_TRACT | 0 refills | Status: DC | PRN
  Filled 2021-07-28: qty 18, 25d supply, fill #0

## 2021-07-28 NOTE — ED Triage Notes (Signed)
Pt presents with cough, sore throat, and congestion that began on Saturday.

## 2021-07-31 ENCOUNTER — Ambulatory Visit (INDEPENDENT_AMBULATORY_CARE_PROVIDER_SITE_OTHER): Payer: Managed Care, Other (non HMO)

## 2021-07-31 ENCOUNTER — Ambulatory Visit (HOSPITAL_COMMUNITY)
Admission: EM | Admit: 2021-07-31 | Discharge: 2021-07-31 | Disposition: A | Discharge status: Home / Self Care | Payer: Managed Care, Other (non HMO) | Attending: Student | Admitting: Student

## 2021-07-31 ENCOUNTER — Encounter (HOSPITAL_COMMUNITY): Payer: Self-pay | Admitting: *Deleted

## 2021-07-31 ENCOUNTER — Other Ambulatory Visit: Payer: Self-pay

## 2021-07-31 ENCOUNTER — Ambulatory Visit: Payer: Self-pay

## 2021-07-31 DIAGNOSIS — R059 Cough, unspecified: Secondary | ICD-10-CM | POA: Diagnosis not present

## 2021-07-31 DIAGNOSIS — R051 Acute cough: Secondary | ICD-10-CM

## 2021-07-31 MED ORDER — AMOXICILLIN-POT CLAVULANATE 875-125 MG PO TABS
1.0000 | ORAL_TABLET | Freq: Two times a day (BID) | ORAL | 0 refills | Status: DC
  Filled 2021-07-31: qty 14, 7d supply, fill #0

## 2021-07-31 NOTE — ED Provider Notes (Signed)
, [place]    CSN: [id number] Arrival date & time: 07/31/21  1152      History   Chief Complaint Chief Complaint  Patient presents with   Headache   Cough   Muscle Pain    HPI Renee Collier is a 51 y.o. female presenting with cough, headaches, myalgias x 7 days. History noncontributory, never smoker.  Cough has become productive of yellow and green sputum.  She states that sometimes she feels that she is wheezing at nighttime, laying flat on her back makes it worse.  Denies current dyspnea, chest pain, fevers.  She does have some sternal pain with coughing and moving; denies chest pain at rest.  Denies left-sided chest pain, left arm pain, left jaw pain, dizziness, weakness.  Has attempted the medications prescribed at last visit without relief. Needs another work note.   HPI  Past Medical History:  Diagnosis Date   Arthritis    hips   Hypertension     Patient Active Problem List   Diagnosis Date Noted   Prediabetes 03/31/2021   Menometrorrhagia 06/10/2020   Primary osteoarthritis of left hip 10/18/2019   Vaginal discharge 12/14/2018   Status post HTA endometrial ablation 04/10/2014   Abnormal uterine bleeding (AUB) 12/18/2013    Past Surgical History:  Procedure Laterality Date   DILATION AND CURETTAGE OF UTERUS N/A 04/27/2017   Procedure: Ultrasound Guided Dilatation and Curettage;  Surgeon: [name], MD;  Location: [facility];  Service: Gynecology;  Laterality: N/A;   ENDOMETRIAL ABLATION     HYSTEROSCOPY N/A 03/12/2014   Procedure: HYSTEROSCOPY WITH HYDROTHERMAL ABLATION;  Surgeon: Osborne Oman, MD;  Location: [facility];  Service: Gynecology;  Laterality: N/A;   TUBAL LIGATION     VAGINAL HYSTERECTOMY Left 06/10/2020   Procedure: HYSTERECTOMY VAGINAL with LEFT Salpingectomy;  Surgeon: Woodroe Mode, MD;  Location: [facility];  Service: Gynecology;  Laterality: Left;    OB History     Gravida  3   Para  3   Term  3   Preterm       AB      Living  3      SAB      IAB      Ectopic      Multiple      Live Births  3            Home Medications    Prior to Admission medications   Medication Sig Start Date End Date Taking? Authorizing Provider  amoxicillin-clavulanate (AUGMENTIN) 875-125 MG tablet Take 1 tablet by mouth every 12 (twelve) hours. 07/31/21  Yes Hazel Sams, PA-C  albuterol (VENTOLIN HFA) 108 (90 Base) MCG/ACT inhaler Inhale 1-2 puffs into the lungs every 6 (six) hours as needed for wheezing or shortness of breath. 07/28/21   Hazel Sams, PA-C  cyclobenzaprine (FLEXERIL) 10 MG tablet Take 1 tablet (10 mg total) by mouth 2 (two) times daily as needed for muscle spasms. 03/31/21   Charlott Rakes, MD  lisinopril-hydrochlorothiazide (ZESTORETIC) 10-12.5 MG tablet Take 1 tablet by mouth daily. 03/31/21   Charlott Rakes, MD  meloxicam (MOBIC) 7.5 MG tablet Take 1 tablet (7.5 mg total) by mouth daily. 03/31/21   Charlott Rakes, MD  predniSONE (DELTASONE) 20 MG tablet Take 2 tablets (40 mg total) by mouth daily for 5 days. Take with breakfast or lunch. Avoid NSAIDs (ibuprofen, etc) while taking this medication. 07/28/21 08/02/21  Hazel Sams, PA-C  promethazine-dextromethorphan (  PROMETHAZINE-DM) 6.25-15 MG/5ML syrup Take 5 mLs by mouth 4 (four) times daily as needed for cough. 07/28/21   Hazel Sams, PA-C    Family History Family History  Problem Relation Age of Onset   Cancer Mother        cervical   Ovarian cancer Sister    Colon cancer Neg Hx    Esophageal cancer Neg Hx    Rectal cancer Neg Hx    Stomach cancer Neg Hx     Social History Social History   Tobacco Use   Smoking status: Never   Smokeless tobacco: Never  Vaping Use   Vaping Use: Never used  Substance Use Topics   Alcohol use: Yes    Comment: occ   Drug use: No     Allergies   Patient has no known allergies.   Review of Systems Review of Systems  Constitutional:  Negative for appetite change,  chills and fever.  HENT:  Negative for congestion, ear pain, rhinorrhea, sinus pressure, sinus pain and sore throat.   Eyes:  Negative for redness and visual disturbance.  Respiratory:  Positive for cough. Negative for chest tightness, shortness of breath and wheezing.   Cardiovascular:  Negative for chest pain and palpitations.  Gastrointestinal:  Negative for abdominal pain, constipation, diarrhea, nausea and vomiting.  Genitourinary:  Negative for dysuria, frequency and urgency.  Musculoskeletal:  Negative for myalgias.  Neurological:  Negative for dizziness, weakness and headaches.  Psychiatric/Behavioral:  Negative for confusion.   All other systems reviewed and are negative.    Physical Exam Triage Vital Signs ED Triage Vitals  Enc Vitals Group     BP 07/31/21 1300 (!) 173/95     Pulse Rate 07/31/21 1300 74     Resp 07/31/21 1300 18     Temp 07/31/21 1300 99.5 F (37.5 C)     Temp src --      SpO2 07/31/21 1300 100 %     Weight --      Height --      Head Circumference --      Peak Flow --      Pain Score 07/31/21 1258 9     Pain Loc --      Pain Edu? --      Excl. in [place]? --    No data found.  Updated Vital Signs BP (!) 173/95   Pulse 74   Temp 99.5 F (37.5 C)   Resp 18   LMP 06/10/2020   SpO2 100%   Visual Acuity Right Eye Distance:   Left Eye Distance:   Bilateral Distance:    Right Eye Near:   Left Eye Near:    Bilateral Near:     Physical Exam Vitals reviewed.  Constitutional:      General: She is not in acute distress.    Appearance: Normal appearance. She is not ill-appearing.  HENT:     Head: Normocephalic and atraumatic.     Right Ear: Tympanic membrane, ear canal and external ear normal. No tenderness. No middle ear effusion. There is no impacted cerumen. Tympanic membrane is not perforated, erythematous, retracted or bulging.     Left Ear: Tympanic membrane, ear canal and external ear normal. No tenderness.  No middle ear effusion. There  is no impacted cerumen. Tympanic membrane is not perforated, erythematous, retracted or bulging.     Nose: Nose normal. No congestion.     Mouth/Throat:     Mouth: Mucous membranes are moist.  Pharynx: Uvula midline. No oropharyngeal exudate or posterior oropharyngeal erythema.  Eyes:     Extraocular Movements: Extraocular movements intact.     Pupils: Pupils are equal, round, and reactive to light.  Cardiovascular:     Rate and Rhythm: Normal rate and regular rhythm.     Heart sounds: Normal heart sounds.  Pulmonary:     Effort: Pulmonary effort is normal.     Breath sounds: Decreased breath sounds present. No wheezing, rhonchi or rales.     Comments: Decreased breath sounds throughout  Chest:     Chest wall: Tenderness present.  Abdominal:     Palpations: Abdomen is soft.     Tenderness: There is no abdominal tenderness. There is no guarding or rebound.  Lymphadenopathy:     Cervical: No cervical adenopathy.     Right cervical: No superficial cervical adenopathy.    Left cervical: No superficial cervical adenopathy.  Neurological:     General: No focal deficit present.     Mental Status: She is alert and oriented to person, place, and time.  Psychiatric:        Mood and Affect: Mood normal.        Behavior: Behavior normal.        Thought Content: Thought content normal.        Judgment: Judgment normal.      UC Treatments / Results  Labs (all labs ordered are listed, but only abnormal results are displayed) Labs Reviewed - No data to display  EKG   Radiology DG Chest 2 View  Result Date: 07/31/2021 CLINICAL DATA:  Cough for 1 week EXAM: CHEST - 2 VIEW COMPARISON:  01/05/2021 FINDINGS: The heart size and mediastinal contours are within normal limits. Both lungs are clear. The visualized skeletal structures are unremarkable. IMPRESSION: No active cardiopulmonary disease. Electronically Signed   By: Davina Poke D.O.   On: 07/31/2021 13:25     Procedures Procedures (including critical care time)  Medications Ordered in UC Medications - No data to display  Initial Impression / Assessment and Plan / UC Course  I have reviewed the triage vital signs and the nursing notes.  Pertinent labs & imaging results that were available during my care of the patient were reviewed by me and considered in my medical decision making (see chart for details).     This patient is a very pleasant 51 y.o. year old female presenting with acute cough. Afebrile, nontachy. No adventitious breath sounds. Oxygenating comfortably on room air. No history pulm ds. Never smoker.  CXR - No active cardiopulmonary disease.  Cough is increasingly productive of green sputum. Following discussion will proceed with augmentin as below. She is in agreement. Complete the prednisone, albuterol, promethazine as directed last visit.   Additional work note provided. ED return precautions discussed. Patient verbalizes understanding and agreement.    Final Clinical Impressions(s) / UC Diagnoses   Final diagnoses:  Acute cough     Discharge Instructions      -Start the antibiotic-Augmentin (amoxicillin-clavulanate), 1 pill every 12 hours for 7 days.  You can take this with food like with breakfast and dinner. -You can complete the medications prescribed last visit as directed -With a virus, you're typically contagious for 5-7 days, or as long as you're having fevers.  -Follow-up if symptoms worsen/persist - chest pain at rest, new/worsening shortness of breath, coughing up red or brown, new fevers, etc.      ED Prescriptions     Medication Sig Dispense  Auth. Provider   amoxicillin-clavulanate (AUGMENTIN) 875-125 MG tablet Take 1 tablet by mouth every 12 (twelve) hours. 14 tablet Hazel Sams, PA-C      PDMP not reviewed this encounter.   Hazel Sams, PA-C 07/31/21 1344

## 2021-07-31 NOTE — ED Triage Notes (Signed)
Pt returns today because cough ,HA and muscle pain has not improved. Pt is requesting a Chest x-ray.

## 2021-07-31 NOTE — Discharge Instructions (Addendum)
-  Start the antibiotic-Augmentin (amoxicillin-clavulanate), 1 pill every 12 hours for 7 days.  You can take this with food like with breakfast and dinner. -You can complete the medications prescribed last visit as directed -With a virus, you're typically contagious for 5-7 days, or as long as you're having fevers.  -Follow-up if symptoms worsen/persist - chest pain at rest, new/worsening shortness of breath, coughing up red or brown, new fevers, etc.

## 2021-07-31 NOTE — Telephone Encounter (Signed)
  Chief Complaint: Cough, SOB, Congestion, sore throat Symptoms: IBID  Frequency: 1 week Pertinent Negatives: Patient denies fever Disposition: '[]'$ ED /'[x]'$ Urgent Care (no appt availability in office) / '[]'$ Appointment(In office/virtual)/ '[]'$  [facility]/ '[]'$ Home Care/ '[]'$ Refused Recommended Disposition /'[]'$ [facility]/ '[]'$  Follow-up with PCP Additional Notes: PT called with continuing s/s  from UC visit 07/28/2021. Condition may be worsening. PT will return to [facility]  for additional care. Reason for Disposition  [1] Follow-up call to recent contact AND [2] information only call, no triage required  Answer Assessment - Initial Assessment Questions 1. REASON FOR CALL or QUESTION: "What is your reason for calling today?" or "How can I best help you?" or "What question do you have that I can help answer?"     PT was seen at [facility] for cough, SOB, upper respiratory infection on 6/27. Pt continues with same s/s, perhaps worsening. Recommended pt return to UC for care.  Protocols used: Information Only Call - No Triage-A-AH

## 2021-07-31 NOTE — Telephone Encounter (Signed)
Yes, she needs to go to the ED.

## 2021-08-13 ENCOUNTER — Other Ambulatory Visit (HOSPITAL_COMMUNITY): Payer: Self-pay

## 2021-08-14 ENCOUNTER — Other Ambulatory Visit (HOSPITAL_COMMUNITY): Payer: Self-pay

## 2021-08-26 ENCOUNTER — Other Ambulatory Visit (HOSPITAL_COMMUNITY): Payer: Self-pay

## 2021-09-23 ENCOUNTER — Other Ambulatory Visit: Payer: Self-pay

## 2021-09-23 ENCOUNTER — Encounter: Payer: Self-pay | Admitting: Family Medicine

## 2021-09-23 ENCOUNTER — Ambulatory Visit: Payer: Managed Care, Other (non HMO) | Attending: Family Medicine | Admitting: Family Medicine

## 2021-09-23 VITALS — BP 131/85 | HR 94 | Temp 98.0°F | Ht 63.0 in | Wt 214.2 lb

## 2021-09-23 DIAGNOSIS — G5603 Carpal tunnel syndrome, bilateral upper limbs: Secondary | ICD-10-CM | POA: Diagnosis not present

## 2021-09-23 DIAGNOSIS — M25552 Pain in left hip: Secondary | ICD-10-CM

## 2021-09-23 DIAGNOSIS — I10 Essential (primary) hypertension: Secondary | ICD-10-CM | POA: Diagnosis not present

## 2021-09-23 DIAGNOSIS — Z23 Encounter for immunization: Secondary | ICD-10-CM

## 2021-09-23 MED ORDER — PREDNISONE 20 MG PO TABS
20.0000 mg | ORAL_TABLET | Freq: Every day | ORAL | 0 refills | Status: DC
  Filled 2021-09-23: qty 5, 5d supply, fill #0

## 2021-09-23 NOTE — Progress Notes (Signed)
Pain in left hip and hands.

## 2021-09-23 NOTE — Progress Notes (Signed)
Subjective:  Patient ID: Renee Collier, female    DOB: [birth year]  Age: 51 y.o. MRN: [id number]  CC: Hip Pain   HPI Renee Collier is a 51 y.o. year old female with a history of hypertension here for follow-up visit.  Interval History: Her hands gets numb and ache for the last 3 weeks with assocaited stiffness and she has to shake her hands for relief. Her left hip has been hurting for a while and s not better. Pain is improved with movement nad is worse at the end of her work day. In the past she received Cortisone injection and has also completed sessions of PT  She goes to the gym and goes walking with her Coworkers. Past Medical History:  Diagnosis Date   Arthritis    hips   Hypertension     Past Surgical History:  Procedure Laterality Date   DILATION AND CURETTAGE OF UTERUS N/A 04/27/2017   Procedure: Ultrasound Guided Dilatation and Curettage;  Surgeon: [name], MD;  Location: [facility];  Service: Gynecology;  Laterality: N/A;   ENDOMETRIAL ABLATION     HYSTEROSCOPY N/A 03/12/2014   Procedure: HYSTEROSCOPY WITH HYDROTHERMAL ABLATION;  Surgeon: Osborne Oman, MD;  Location: [facility];  Service: Gynecology;  Laterality: N/A;   TUBAL LIGATION     VAGINAL HYSTERECTOMY Left 06/10/2020   Procedure: HYSTERECTOMY VAGINAL with LEFT Salpingectomy;  Surgeon: Woodroe Mode, MD;  Location: [facility];  Service: Gynecology;  Laterality: Left;    Family History  Problem Relation Age of Onset   Cancer Mother        cervical   Ovarian cancer Sister    Colon cancer Neg Hx    Esophageal cancer Neg Hx    Rectal cancer Neg Hx    Stomach cancer Neg Hx     Social History   Socioeconomic History   Marital status: Single    Spouse name: Not on file   Number of children: Not on file   Years of education: Not on file   Highest education level: 12th grade  Occupational History   Not on file  Tobacco Use   Smoking status: Never   Smokeless tobacco: Never  Vaping Use    Vaping Use: Never used  Substance and Sexual Activity   Alcohol use: Yes    Comment: occ   Drug use: No   Sexual activity: Not Currently    Birth control/protection: Surgical  Other Topics Concern   Not on file  Social History Narrative   Not on file   Social Determinants of Health   Financial Resource Strain: Not on file  Food Insecurity: No Food Insecurity (05/05/2020)   Hunger Vital Sign    Worried About Running Out of Food in the Last Year: Never true    Ran Out of Food in the Last Year: Never true  Transportation Needs: No Transportation Needs (05/05/2020)   PRAPARE - Hydrologist (Medical): No    Lack of Transportation (Non-Medical): No  Physical Activity: Not on file  Stress: Not on file  Social Connections: Not on file    No Known Allergies  Outpatient Medications Prior to Visit  Medication Sig Dispense Refill   albuterol (VENTOLIN HFA) 108 (90 Base) MCG/ACT inhaler Inhale 1-2 puffs into the lungs every 6 (six) hours as needed for wheezing or shortness of breath. 18 g 0   cyclobenzaprine (FLEXERIL) 10 MG tablet Take 1 tablet (10 mg total) by  mouth 2 (two) times daily as needed for muscle spasms. 60 tablet 2   lisinopril-hydrochlorothiazide (ZESTORETIC) 10-12.5 MG tablet Take 1 tablet by mouth daily. 90 tablet 1   meloxicam (MOBIC) 7.5 MG tablet Take 1 tablet (7.5 mg total) by mouth daily. 30 tablet 2   amoxicillin-clavulanate (AUGMENTIN) 875-125 MG tablet Take 1 tablet by mouth every 12 (twelve) hours. (Patient not taking: Reported on 09/23/2021) 14 tablet 0   promethazine-dextromethorphan (PROMETHAZINE-DM) 6.25-15 MG/5ML syrup Take 5 mLs by mouth 4 (four) times daily as needed for cough. (Patient not taking: Reported on 09/23/2021) 118 mL 0   No facility-administered medications prior to visit.     ROS Review of Systems  Constitutional:  Negative for activity change and appetite change.  HENT:  Negative for sinus pressure and sore  throat.   Respiratory:  Negative for chest tightness, shortness of breath and wheezing.   Cardiovascular:  Negative for chest pain and palpitations.  Gastrointestinal:  Negative for abdominal distention, abdominal pain and constipation.  Genitourinary: Negative.   Musculoskeletal:        See HPI  Neurological:  Positive for numbness.  Psychiatric/Behavioral:  Negative for behavioral problems and dysphoric mood.     Objective:  BP 131/85   Pulse 94   Temp 98 F (36.7 C) (Oral)   Ht '5\' 3"'$  (1.6 m)   Wt 214 lb 3.2 oz (97.2 kg)   LMP 06/10/2020   SpO2 100%   BMI 37.94 kg/m      09/23/2021    3:26 PM 07/31/2021    1:00 PM 07/28/2021    9:06 AM  BP/Weight  Systolic BP 696 789 381  Diastolic BP 85 95 017  Wt. (Lbs) 214.2    BMI 37.94 kg/m2        Physical Exam Constitutional:      Appearance: She is well-developed. She is obese.  Cardiovascular:     Rate and Rhythm: Normal rate.     Heart sounds: Normal heart sounds. No murmur heard. Pulmonary:     Effort: Pulmonary effort is normal.     Breath sounds: Normal breath sounds. No wheezing or rales.  Chest:     Chest wall: No tenderness.  Abdominal:     General: Bowel sounds are normal. There is no distension.     Palpations: Abdomen is soft. There is no mass.     Tenderness: There is no abdominal tenderness.  Musculoskeletal:        General: Normal range of motion.     Right lower leg: No edema.     Left lower leg: No edema.     Comments: Positive phalen's sign  Tenderness on palpation of lateral and anterior aspect of left hip and on ROM.  Neurological:     Mental Status: She is alert and oriented to person, place, and time.  Psychiatric:        Mood and Affect: Mood normal.        Latest Ref Rng & Units 03/31/2021    9:43 AM 02/13/2021   11:00 AM 01/05/2021    5:06 PM  CMP  Glucose 70 - 99 mg/dL 98  97  107   BUN 6 - 24 mg/dL '9  7  11   '$ Creatinine 0.57 - 1.00 mg/dL 0.77  0.77  0.81   Sodium 134 - 144 mmol/L  138  137  137   Potassium 3.5 - 5.2 mmol/L 4.2  3.2  3.3   Chloride 96 - 106 mmol/L  106  102  103   CO2 20 - 29 mmol/L '24  28  26   '$ Calcium 8.7 - 10.2 mg/dL 9.6  9.0  9.3   Total Protein 6.0 - 8.5 g/dL 7.4  7.0    Total Bilirubin 0.0 - 1.2 mg/dL 0.3  1.0    Alkaline Phos 44 - 121 IU/L 113  88    AST 0 - 40 IU/L 8  13    ALT 0 - 32 IU/L 6  8      Lipid Panel     Component Value Date/Time   CHOL 149 03/31/2021 0943   TRIG 91 03/31/2021 0943   HDL 51 03/31/2021 0943   CHOLHDL 3.2 01/01/2020 1414   CHOLHDL 2.4 Ratio 11/23/2006 2033   VLDL 20 11/23/2006 2033   LDLCALC 81 03/31/2021 0943    CBC    Component Value Date/Time   WBC 6.4 03/31/2021 0943   WBC 6.3 02/13/2021 1100   RBC 4.57 03/31/2021 0943   RBC 4.43 02/13/2021 1100   HGB 13.5 03/31/2021 0943   HCT 41.2 03/31/2021 0943   PLT 387 03/31/2021 0943   MCV 90 03/31/2021 0943   MCH 29.5 03/31/2021 0943   MCH 30.0 02/13/2021 1100   MCHC 32.8 03/31/2021 0943   MCHC 33.0 02/13/2021 1100   RDW 14.1 03/31/2021 0943   LYMPHSABS 3.5 (H) 03/31/2021 0943   MONOABS 0.6 01/05/2021 1754   EOSABS 0.1 03/31/2021 0943   BASOSABS 0.0 03/31/2021 0943    Lab Results  Component Value Date   HGBA1C 5.8 03/31/2021    Assessment & Plan:  1. Bilateral carpal tunnel syndrome Uncontrolled Advised to use wrist brace Might benefit from cortisone injection - predniSONE (DELTASONE) 20 MG tablet; Take 1 tablet (20 mg total) by mouth daily with breakfast.  Dispense: 5 tablet; Refill: 0 - Basic Metabolic Panel  2. Left hip pain Completed PT in the past Received cortisone injections in the past - AMB referral to orthopedics  3. Hypertension, unspecified type Controlled Continue current regimen Counseled on blood pressure goal of less than 130/80, low-sodium, DASH diet, medication compliance, 150 minutes of moderate intensity exercise per week. Discussed medication compliance, adverse effects.  4. Need for immunization against  influenza - Flu Vaccine QUAD 50moIM (Fluarix, Fluzone & Alfiuria Quad PF)   Meds ordered this encounter  Medications   predniSONE (DELTASONE) 20 MG tablet    Sig: Take 1 tablet (20 mg total) by mouth daily with breakfast.    Dispense:  5 tablet    Refill:  0    Follow-up: Return in about 6 months (around 03/26/2022) for Chronic medical conditions.       ECharlott Rakes MD, FAAFP. [facility]  09/23/2021, 5:18 PM

## 2021-09-23 NOTE — Patient Instructions (Signed)

## 2021-09-24 LAB — BASIC METABOLIC PANEL
BUN/Creatinine Ratio: 16 (ref 9–23)
BUN: 11 mg/dL (ref 6–24)
CO2: 23 mmol/L (ref 20–29)
Calcium: 10.3 mg/dL — ABNORMAL HIGH (ref 8.7–10.2)
Chloride: 100 mmol/L (ref 96–106)
Creatinine, Ser: 0.69 mg/dL (ref 0.57–1.00)
Glucose: 107 mg/dL — ABNORMAL HIGH (ref 70–99)
Potassium: 4 mmol/L (ref 3.5–5.2)
Sodium: 141 mmol/L (ref 134–144)
eGFR: 107 mL/min/{1.73_m2} (ref >59)

## 2021-10-01 ENCOUNTER — Encounter: Payer: Self-pay | Admitting: Orthopaedic Surgery

## 2021-10-01 ENCOUNTER — Ambulatory Visit: Payer: Self-pay

## 2021-10-01 ENCOUNTER — Ambulatory Visit (INDEPENDENT_AMBULATORY_CARE_PROVIDER_SITE_OTHER): Payer: 59 | Admitting: Orthopaedic Surgery

## 2021-10-01 ENCOUNTER — Ambulatory Visit (INDEPENDENT_AMBULATORY_CARE_PROVIDER_SITE_OTHER): Payer: 59

## 2021-10-01 VITALS — BP 152/91 | HR 92 | Ht 63.0 in | Wt 219.0 lb

## 2021-10-01 DIAGNOSIS — M25552 Pain in left hip: Secondary | ICD-10-CM

## 2021-10-01 DIAGNOSIS — R200 Anesthesia of skin: Secondary | ICD-10-CM | POA: Diagnosis not present

## 2021-10-01 NOTE — Progress Notes (Signed)
   Procedure Note  Patient: Renee Collier             Date of Birth: [birth year]           MRN: [id number]             Visit Date: 10/01/2021  Procedures: Visit Diagnoses:  1. Pain in left hip   2. Hand numbness    Procedure: US-guided intra-articular hip injection, left After discussion on risks/benefits/indications and informed verbal consent was obtained, a timeout was performed. Patient was lying supine on exam table. The hip was cleaned with betadine and alcohol swab. Then utilizing ultrasound guidance, the patient's femoral head and neck junction was identified and subsequently injected with 4:2 lidocaine: depomedrol via an in-plane approach with ultrasound visualization of the injectate administered into the hip joint. Patient tolerated procedure well without immediate complications.  - Evaluated patient about 10 mins post-injection and she had improvement in pain-free range of motion - follow-up with Dr. Erlinda Hong and team as indicated; I am happy to see as needed  Elba Barman, DO [place]  This note was dictated using Dragon naturally speaking software and may contain errors in syntax, spelling, or content which have not been identified prior to signing this note.

## 2021-10-01 NOTE — Progress Notes (Signed)
Office Visit Note   Patient: Renee Collier           Date of Birth: [birth year]           MRN: [id number] Visit Date: 10/01/2021              Requested by: Charlott Rakes, MD [address],  [place] [address] PCP: Charlott Rakes, MD   Assessment & Plan: Visit Diagnoses:  1. Pain in left hip   2. Hand numbness     Plan: Impression is left hip osteoarthritis and bilateral hand carpal tunnel syndrome right greater than left.  In regards to the hip, we have discussed repeat cortisone injection as she had good relief in the past.  She is agreeable to this plan.  Regards to her hands, we will go ahead and order nerve conduction study/EMG.  She will follow-up with [place] once completed.  She will call with concerns or questions in the meantime.  Follow-Up Instructions: Return for after NCS/EMG.   Orders:  Orders Placed This Encounter  Procedures   XR HIP UNILAT W OR W/O PELVIS 2-3 VIEWS LEFT   US Guided Needle Placement - No Linked Charges   Ambulatory referral to Physical Medicine Rehab   No orders of the defined types were placed in this encounter.     Procedures: No procedures performed   Clinical Data: No additional findings.   Subjective: Chief Complaint  Patient presents with   Left Hip - Pain   Right Wrist - Pain    HPI patient is a pleasant 51 year old female who comes in today with recurrent left hip pain and new onset bilateral hand pain and paresthesias.  In regards to the left hip, she has a history of underlying osteoarthritis.  She was seen in our office about 2 years ago where she was referred for left hip cortisone injection.  She notes that this significantly helped until about a month or 2 ago.  Her symptoms have recently returned and progressively worsened.  She denies any new injury or change in activity.  Her pain is constant worse with sitting.  She does get slight relief when moving around.  She has tried a heating pad, meloxicam,  Flexeril and most recently prednisone without significant relief.  She has an occasional paresthesias to the left leg.  In regards to her hands, the right is worse than the left.  She is right-handed.  She notes paresthesias to all 5 fingers of both hands worse at night where she frequently wakes up shaking her hands.  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  She has recently been wearing night splints to both wrist which do help at night.  No previous nerve conduction study or cortisone injection to the carpal tunnel  Review of Systems as detailed in HPI.  All others are negative.   Objective: Vital Signs: Ht '5\' 3"'$  (1.6 m)   Wt 219 lb (99.3 kg)   LMP 06/10/2020   BMI 38.79 kg/m   Physical Exam well-developed well-nourished female no acute distress.  Alert and oriented x3.  Ortho Exam examination of her left hip reveals a markedly positive logroll.  Positive FADIR and Stinchfield.  She is neurovascular tact distally.  Bilateral hand exam shows positive Phalen and Tinel on the right.  Negative on the left.  No thenar atrophy.  She is neurovascular intact distally.  Specialty Comments:  No specialty comments  available.  Imaging: XR HIP UNILAT W OR W/O PELVIS 2-3 VIEWS LEFT  Result Date: 10/01/2021 Moderate degenerative changes to the left hip joint    PMFS History: Patient Active Problem List   Diagnosis Date Noted   Prediabetes 03/31/2021   Menometrorrhagia 06/10/2020   Primary osteoarthritis of left hip 10/18/2019   Vaginal discharge 12/14/2018   Status post HTA endometrial ablation 04/10/2014   Abnormal uterine bleeding (AUB) 12/18/2013   Past Medical History:  Diagnosis Date   Arthritis    hips   Hypertension     Family History  Problem Relation Age of Onset   Cancer Mother        cervical   Ovarian cancer Sister    Colon cancer Neg Hx    Esophageal cancer Neg Hx    Rectal cancer Neg Hx    Stomach cancer Neg Hx      Past Surgical History:  Procedure Laterality Date   DILATION AND CURETTAGE OF UTERUS N/A 04/27/2017   Procedure: Ultrasound Guided Dilatation and Curettage;  Surgeon: [name], MD;  Location: [facility];  Service: Gynecology;  Laterality: N/A;   ENDOMETRIAL ABLATION     HYSTEROSCOPY N/A 03/12/2014   Procedure: HYSTEROSCOPY WITH HYDROTHERMAL ABLATION;  Surgeon: Osborne Oman, MD;  Location: [facility];  Service: Gynecology;  Laterality: N/A;   TUBAL LIGATION     VAGINAL HYSTERECTOMY Left 06/10/2020   Procedure: HYSTERECTOMY VAGINAL with LEFT Salpingectomy;  Surgeon: Woodroe Mode, MD;  Location: [facility];  Service: Gynecology;  Laterality: Left;   Social History   Occupational History   Not on file  Tobacco Use   Smoking status: Never   Smokeless tobacco: Never  Vaping Use   Vaping Use: Never used  Substance and Sexual Activity   Alcohol use: Yes    Comment: occ   Drug use: No   Sexual activity: Not Currently    Birth control/protection: Surgical

## 2021-10-20 ENCOUNTER — Encounter: Payer: Self-pay | Admitting: Physical Medicine and Rehabilitation

## 2021-10-20 ENCOUNTER — Ambulatory Visit (INDEPENDENT_AMBULATORY_CARE_PROVIDER_SITE_OTHER): Payer: 59 | Admitting: Physical Medicine and Rehabilitation

## 2021-10-20 DIAGNOSIS — R202 Paresthesia of skin: Secondary | ICD-10-CM | POA: Diagnosis not present

## 2021-10-20 NOTE — Progress Notes (Signed)
Bilateral hand numbness and pain. Wears braces all day long. Does customer service

## 2021-10-20 NOTE — Progress Notes (Unsigned)
   Renee Collier - 51 y.o. female MRN [id number]  Date of birth: July 05, 1970  Office Visit Note: Visit Date: 10/20/2021 PCP: Charlott Rakes, MD Referred by: Leandrew Koyanagi, MD  Subjective: Chief Complaint  Patient presents with   Left Hand - Numbness   Right Hand - Numbness   HPI:  Renee Collier is a 51 y.o. female who comes in today at the request of Dr. Eduard Roux for electrodiagnostic study of the Bilateral upper extremities.  Patient is Right hand dominant. In regards to her hands, the right is worse than the left.  She is right-handed.  She notes paresthesias to all 5 fingers of both hands worse at night where she frequently wakes up shaking her hands.  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  She has recently been wearing night splints to both wrist which do help at night.  No previous nerve conduction study.    ROS Otherwise per HPI.  Assessment & Plan: Visit Diagnoses:    ICD-10-CM   1. Paresthesia of skin  R20.2 NCV with EMG (electromyography)      Plan: No additional findings.   Meds & Orders: No orders of the defined types were placed in this encounter.   Orders Placed This Encounter  Procedures   NCV with EMG (electromyography)    Follow-up: Return in about 2 weeks (around 11/03/2021) for Eduard Roux, MD.   Procedures: No procedures performed      Clinical History: No specialty comments available.     Objective:  VS:  HT:    WT:   BMI:     BP:   HR: bpm  TEMP: ( )  RESP:  Physical Exam Musculoskeletal:        General: No swelling, tenderness or deformity.     Comments: Inspection reveals no atrophy of the bilateral APB or FDI or hand intrinsics. There is no swelling, color changes, allodynia or dystrophic changes. There is 5 out of 5 strength in the bilateral wrist extension, finger abduction and long finger flexion. There is intact sensation to light touch in all dermatomal and peripheral nerve  distributions. There is a negative Froment's test bilaterally. There is a negative Tinel's test at the bilateral wrist and elbow. There is a negative Phalen's test bilaterally. There is a negative Hoffmann's test bilaterally.  Skin:    General: Skin is warm and dry.     Findings: No erythema or rash.  Neurological:     General: No focal deficit present.     Mental Status: She is alert and oriented to person, place, and time.     Motor: No weakness or abnormal muscle tone.     Coordination: Coordination normal.  Psychiatric:        Mood and Affect: Mood normal.        Behavior: Behavior normal.      Imaging: No results found.

## 2021-10-22 NOTE — Procedures (Signed)
EMG & NCV Findings: Evaluation of the left median motor and the right median motor nerves showed prolonged distal onset latency (L4.6, R5.3 ms) and decreased conduction velocity (Elbow-Wrist, L48, R49 m/s).  The left median (across palm) sensory nerve showed prolonged distal peak latency (Wrist, 5.2 ms).  The right median (across palm) sensory nerve showed prolonged distal peak latency (Wrist, 5.8 ms) and prolonged distal peak latency (Palm, 2.8 ms).  All remaining nerves (as indicated in the following tables) were within normal limits.  All left vs. right side differences were within normal limits.    All examined muscles (as indicated in the following table) showed no evidence of electrical instability.    Impression: The above electrodiagnostic study is ABNORMAL and reveals evidence of a moderate bilateral median nerve entrapment at the wrist (carpal tunnel syndrome) affecting sensory and motor components.   There is no significant electrodiagnostic evidence of any other focal nerve entrapment, brachial plexopathy or cervical radiculopathy  Recommendations: 1.  Follow-up with referring physician. 2.  Continue current management of symptoms. 3.  Continue use of resting splint at night-time and as needed during the day. 4.  Suggest surgical evaluation.  ___________________________ Laurence Spates FAAPMR Board Certified, American Board of Physical Medicine and Rehabilitation    Nerve Conduction Studies Anti Sensory Summary Table   Stim Site NR Peak (ms) Norm Peak (ms) P-T Amp (V) Norm P-T Amp Site1 Site2 Delta-P (ms) Dist (cm) Vel (m/s) Norm Vel (m/s)  Left Median Acr Palm Anti Sensory (2nd Digit)  30.6C  Wrist    *5.2 <3.6 31.4 >10 Wrist Palm 3.2 0.0    Palm    2.0 <2.0 16.5         Right Median Acr Palm Anti Sensory (2nd Digit)  29.9C  Wrist    *5.8 <3.6 25.9 >10 Wrist Palm 3.0 0.0    Palm    *2.8 <2.0 2.7         Left Radial Anti Sensory (Base 1st Digit)  30C  Wrist    1.7 <3.1  41.7  Wrist Base 1st Digit 1.7 0.0    Right Radial Anti Sensory (Base 1st Digit)  30.3C  Wrist    2.0 <3.1 32.1  Wrist Base 1st Digit 2.0 0.0    Left Ulnar Anti Sensory (5th Digit)  30.7C  Wrist    3.1 <3.7 33.7 >15.0 Wrist 5th Digit 3.1 14.0 45 >38  Right Ulnar Anti Sensory (5th Digit)  30.3C  Wrist    3.1 <3.7 25.4 >15.0 Wrist 5th Digit 3.1 14.0 45 >38   Motor Summary Table   Stim Site NR Onset (ms) Norm Onset (ms) O-P Amp (mV) Norm O-P Amp Site1 Site2 Delta-0 (ms) Dist (cm) Vel (m/s) Norm Vel (m/s)  Left Median Motor (Abd Poll Brev)  30.3C  Wrist    *4.6 <4.2 8.7 >5 Elbow Wrist 4.2 20.0 *48 >50  Elbow    8.8  8.8         Right Median Motor (Abd Poll Brev)  30.5C  Wrist    *5.3 <4.2 8.3 >5 Elbow Wrist 4.1 20.0 *49 >50  Elbow    9.4  7.7         Left Ulnar Motor (Abd Dig Min)  30.5C  Wrist    3.0 <4.2 11.1 >3 B Elbow Wrist 3.1 19.0 61 >53  B Elbow    6.1  10.0  A Elbow B Elbow 1.3 10.0 77 >53  A Elbow    7.4  6.1  Right Ulnar Motor (Abd Dig Min)  30.6C  Wrist    2.6 <4.2 13.3 >3 B Elbow Wrist 3.3 20.5 62 >53  B Elbow    5.9  12.6  A Elbow B Elbow 1.4 10.0 71 >53  A Elbow    7.3  12.1          EMG   Side Muscle Nerve Root Ins Act Fibs Psw Amp Dur Poly Recrt Int Fraser Din Comment  Right Abd Poll Brev Median C8-T1 Nml Nml Nml Nml Nml 0 Nml Nml   Right 1stDorInt Ulnar C8-T1 Nml Nml Nml Nml Nml 0 Nml Nml   Right PronatorTeres Median C6-7 Nml Nml Nml Nml Nml 0 Nml Nml   Right Biceps Musculocut C5-6 Nml Nml Nml Nml Nml 0 Nml Nml   Right Deltoid Axillary C5-6 Nml Nml Nml Nml Nml 0 Nml Nml     Nerve Conduction Studies Anti Sensory Left/Right Comparison   Stim Site L Lat (ms) R Lat (ms) L-R Lat (ms) L Amp (V) R Amp (V) L-R Amp (%) Site1 Site2 L Vel (m/s) R Vel (m/s) L-R Vel (m/s)  Median Acr Palm Anti Sensory (2nd Digit)  30.6C  Wrist *5.2 *5.8 0.6 31.4 25.9 17.5 Wrist Palm     Palm 2.0 *2.8 0.8 16.5 2.7 83.6       Radial Anti Sensory (Base 1st Digit)  30C  Wrist 1.7  2.0 0.3 41.7 32.1 23.0 Wrist Base 1st Digit     Ulnar Anti Sensory (5th Digit)  30.7C  Wrist 3.1 3.1 0.0 33.7 25.4 24.6 Wrist 5th Digit 45 45 0   Motor Left/Right Comparison   Stim Site L Lat (ms) R Lat (ms) L-R Lat (ms) L Amp (mV) R Amp (mV) L-R Amp (%) Site1 Site2 L Vel (m/s) R Vel (m/s) L-R Vel (m/s)  Median Motor (Abd Poll Brev)  30.3C  Wrist *4.6 *5.3 0.7 8.7 8.3 4.6 Elbow Wrist *48 *49 1  Elbow 8.8 9.4 0.6 8.8 7.7 12.5       Ulnar Motor (Abd Dig Min)  30.5C  Wrist 3.0 2.6 0.4 11.1 13.3 16.5 B Elbow Wrist 61 62 1  B Elbow 6.1 5.9 0.2 10.0 12.6 20.6 A Elbow B Elbow 77 71 6  A Elbow 7.4 7.3 0.1 6.1 12.1 49.6          Waveforms:

## 2021-10-24 ENCOUNTER — Other Ambulatory Visit (HOSPITAL_COMMUNITY): Payer: Self-pay

## 2021-10-28 ENCOUNTER — Telehealth: Payer: 59 | Admitting: Physician Assistant

## 2021-10-28 ENCOUNTER — Other Ambulatory Visit: Payer: Self-pay

## 2021-10-28 ENCOUNTER — Ambulatory Visit: Payer: Self-pay | Admitting: *Deleted

## 2021-10-28 DIAGNOSIS — R3989 Other symptoms and signs involving the genitourinary system: Secondary | ICD-10-CM | POA: Diagnosis not present

## 2021-10-28 MED ORDER — CEPHALEXIN 500 MG PO CAPS
500.0000 mg | ORAL_CAPSULE | Freq: Two times a day (BID) | ORAL | 0 refills | Status: AC
  Filled 2021-10-28: qty 14, 7d supply, fill #0

## 2021-10-28 NOTE — Patient Instructions (Signed)
Renee Collier, thank you for joining Leeanne Rio, PA-C for today's virtual visit.  While this provider is not your primary care provider (PCP), if your PCP is located in our provider database this encounter information will be shared with them immediately following your visit.  Consent: (Patient) Renee Collier provided verbal consent for this virtual visit at the beginning of the encounter.  Current Medications:  Current Outpatient Medications:    cephALEXin (KEFLEX) 500 MG capsule, Take 1 capsule (500 mg total) by mouth 2 (two) times daily for 7 days., Disp: 14 capsule, Rfl: 0   cyclobenzaprine (FLEXERIL) 10 MG tablet, Take 1 tablet (10 mg total) by mouth 2 (two) times daily as needed for muscle spasms., Disp: 60 tablet, Rfl: 2   lisinopril-hydrochlorothiazide (ZESTORETIC) 10-12.5 MG tablet, Take 1 tablet by mouth daily., Disp: 90 tablet, Rfl: 1   meloxicam (MOBIC) 7.5 MG tablet, Take 1 tablet (7.5 mg total) by mouth daily., Disp: 30 tablet, Rfl: 2   Medications ordered in this encounter:  Meds ordered this encounter  Medications   cephALEXin (KEFLEX) 500 MG capsule    Sig: Take 1 capsule (500 mg total) by mouth 2 (two) times daily for 7 days.    Dispense:  14 capsule    Refill:  0    Order Specific Question:   Supervising Provider    Answer:   Chase Picket [id number]     *If you need refills on other medications prior to your next appointment, please contact your pharmacy*  Follow-Up: Call back or seek an in-person evaluation if the symptoms worsen or if the condition fails to improve as anticipated.  [place] [phone]  Other Instructions Your symptoms are consistent with a bladder infection, also called acute cystitis. Please take your antibiotic (Keflex) as directed until all pills are gone.  Stay very well hydrated.  Consider a daily probiotic (Align, Culturelle, or Activia) to help prevent stomach upset caused by the antibiotic.  Taking a  probiotic daily may also help prevent recurrent UTIs.  Also consider taking AZO (Phenazopyridine) tablets to help decrease pain with urination.    Urinary Tract Infection A urinary tract infection (UTI) can occur any place along the urinary tract. The tract includes the kidneys, ureters, bladder, and urethra. A type of germ called bacteria often causes a UTI. UTIs are often helped with antibiotic medicine.  HOME CARE  If given, take antibiotics as told by your doctor. Finish them even if you start to feel better. Drink enough fluids to keep your pee (urine) clear or pale yellow. Avoid tea, drinks with caffeine, and bubbly (carbonated) drinks. Pee often. Avoid holding your pee in for a long time. Pee before and after having sex (intercourse). Wipe from front to back after you poop (bowel movement) if you are a woman. Use each tissue only once. GET HELP RIGHT AWAY IF:  You have back pain. You have lower belly (abdominal) pain. You have chills. You feel sick to your stomach (nauseous). You throw up (vomit). Your burning or discomfort with peeing does not go away. You have a fever. Your symptoms are not better in 3 days. MAKE SURE YOU:  Understand these instructions. Will watch your condition. Will get help right away if you are not doing well or get worse. Document Released: 07/07/2007 Document Revised: 10/13/2011 Document Reviewed: 08/19/2011 [facility] Patient Information 2015 [place], [place]. This information is not intended to replace advice given to you by your health care provider. Make sure  you discuss any questions you have with your health care provider.    If you have been instructed to have an in-person evaluation today at a local Urgent Care facility, please use the link below. It will take you to a list of all of our available [facility], including address, phone number and hours of operation. Please do not delay care.  [facility]  If you or a  family member do not have a primary care provider, use the link below to schedule a visit and establish care. When you choose a [facility] primary care physician or advanced practice provider, you gain a long-term partner in health. Find a Primary Care Provider  Learn more about [facility]'s in-office and virtual care options: [facility]

## 2021-10-28 NOTE — Telephone Encounter (Signed)
  Chief Complaint: UTI symptoms Symptoms: dysuria Frequency: frequent urination Pertinent Negatives: Patient denies fever Disposition: '[]'$ ED /'[]'$ Urgent Care (no appt availability in office) / '[]'$ Appointment(In office/virtual)/ '[x]'$  [facility]/ '[]'$ Home Care/ '[]'$ Refused Recommended Disposition /'[]'$ [facility]/ '[]'$  Follow-up with PCP Additional Notes: [facility] visit scheduled. Home care discussed.   Reason for Disposition  Urinating more frequently than usual (i.e., frequency)  Answer Assessment - Initial Assessment Questions 1. SYMPTOM: "What's the main symptom you're concerned about?" (e.g., frequency, incontinence)     dysuria 2. ONSET: "When did the  dysuria  start?"     2 days ago 3. PAIN: "Is there any pain?" If Yes, ask: "How bad is it?" (Scale: 1-10; mild, moderate, severe)     8 4. CAUSE: "What do you think is causing the symptoms?"     UTI 5. OTHER SYMPTOMS: "Do you have any other symptoms?" (e.g., blood in urine, fever, flank pain, pain with urination)     Pain with urination 6. PREGNANCY: "Is there any chance you are pregnant?" "When was your last menstrual period?"     no  Protocols used: Urinary Symptoms-A-AH

## 2021-10-28 NOTE — Progress Notes (Signed)
Virtual Visit Consent   Renee Collier, you are scheduled for a virtual visit with a [facility] provider today. Just as with appointments in the office, your consent must be obtained to participate. Your consent will be active for this visit and any virtual visit you may have with one of our providers in the next 365 days. If you have a MyChart account, a copy of this consent can be sent to you electronically.  As this is a virtual visit, video technology does not allow for your provider to perform a traditional examination. This may limit your provider's ability to fully assess your condition. If your provider identifies any concerns that need to be evaluated in person or the need to arrange testing (such as labs, EKG, etc.), we will make arrangements to do so. Although advances in technology are sophisticated, we cannot ensure that it will always work on either your end or our end. If the connection with a video visit is poor, the visit may have to be switched to a telephone visit. With either a video or telephone visit, we are not always able to ensure that we have a secure connection.  By engaging in this virtual visit, you consent to the provision of healthcare and authorize for your insurance to be billed (if applicable) for the services provided during this visit. Depending on your insurance coverage, you may receive a charge related to this service.  I need to obtain your verbal consent now. Are you willing to proceed with your visit today? Renee Collier has provided verbal consent on 10/28/2021 for a virtual visit (video or telephone). Leeanne Rio, [place]  Date: 10/28/2021 9:24 AM  Virtual Visit via Video Note   I, Leeanne Rio, connected with  Renee Collier  ([id number], 1970/04/01) on 10/28/21 at  9:45 AM EDT by a video-enabled telemedicine application and verified that I am speaking with the correct person using two identifiers.  Location: Patient: Virtual Visit Location  Patient: Other: Work Provider: Home Office   I discussed the limitations of evaluation and management by telemedicine and the availability of in person appointments. The patient expressed understanding and agreed to proceed.    History of Present Illness: Renee Collier is a 51 y.o. who identifies as a female who was assigned female at birth, and is being seen today for possible UTI. Notes a couple of days of lower abdominal pressure, low back pain, dysuria,urinary urgency and frequency. Notes some urinary hesitancy. Denies hematuria, fever, chills. Took some AZO this morning which helped some.   HPI: HPI  Problems:  Patient Active Problem List   Diagnosis Date Noted   Prediabetes 03/31/2021   Menometrorrhagia 06/10/2020   Primary osteoarthritis of left hip 10/18/2019   Vaginal discharge 12/14/2018   Status post HTA endometrial ablation 04/10/2014   Abnormal uterine bleeding (AUB) 12/18/2013    Allergies: No Known Allergies Medications:  Current Outpatient Medications:    cephALEXin (KEFLEX) 500 MG capsule, Take 1 capsule (500 mg total) by mouth 2 (two) times daily for 7 days., Disp: 14 capsule, Rfl: 0   cyclobenzaprine (FLEXERIL) 10 MG tablet, Take 1 tablet (10 mg total) by mouth 2 (two) times daily as needed for muscle spasms., Disp: 60 tablet, Rfl: 2   lisinopril-hydrochlorothiazide (ZESTORETIC) 10-12.5 MG tablet, Take 1 tablet by mouth daily., Disp: 90 tablet, Rfl: 1   meloxicam (MOBIC) 7.5 MG tablet, Take 1 tablet (7.5 mg total) by mouth daily., Disp: 30 tablet, Rfl: 2  Observations/Objective: Patient is well-developed,  well-nourished in no acute distress.  Resting comfortably  at home.  Head is normocephalic, atraumatic.  No labored breathing. Speech is clear and coherent with logical content.  Patient is alert and oriented at baseline.   Assessment and Plan: 1. Suspected UTI - cephALEXin (KEFLEX) 500 MG capsule; Take 1 capsule (500 mg total) by mouth 2 (two) times daily for  7 days.  Dispense: 14 capsule; Refill: 0  Classic UTI symptoms with absence of alarm signs or symptoms. Prior history of UTI. Will treat empirically with Keflex for suspected uncomplicated cystitis. Supportive measures and OTC medications reviewed. Strict in-person evaluation precautions discussed.    Follow Up Instructions: I discussed the assessment and treatment plan with the patient. The patient was provided an opportunity to ask questions and all were answered. The patient agreed with the plan and demonstrated an understanding of the instructions.  A copy of instructions were sent to the patient via MyChart unless otherwise noted below.   The patient was advised to call back or seek an in-person evaluation if the symptoms worsen or if the condition fails to improve as anticipated.  Time:  I spent 10 minutes with the patient via telehealth technology discussing the above problems/concerns.    Leeanne Rio, PA-C

## 2021-10-29 ENCOUNTER — Other Ambulatory Visit: Payer: Self-pay

## 2021-10-30 ENCOUNTER — Ambulatory Visit (INDEPENDENT_AMBULATORY_CARE_PROVIDER_SITE_OTHER): Payer: 59 | Admitting: Orthopaedic Surgery

## 2021-10-30 ENCOUNTER — Encounter: Payer: Self-pay | Admitting: Orthopaedic Surgery

## 2021-10-30 DIAGNOSIS — G5602 Carpal tunnel syndrome, left upper limb: Secondary | ICD-10-CM | POA: Insufficient documentation

## 2021-10-30 DIAGNOSIS — G5601 Carpal tunnel syndrome, right upper limb: Secondary | ICD-10-CM | POA: Diagnosis not present

## 2021-10-30 NOTE — Progress Notes (Signed)
Office Visit Note   Patient: Renee Collier           Date of Birth: [birth year]           MRN: [id number] Visit Date: 10/30/2021              Requested by: Charlott Rakes, MD [place],  [place] [address] PCP: Charlott Rakes, MD   Assessment & Plan: Visit Diagnoses:  1. Right carpal tunnel syndrome   2. Left carpal tunnel syndrome     Plan: Nerve conduction studies showed moderate bilateral carpal tunnel syndrome slightly worse on the right hand.  These findings were reviewed with the patient in detail.  Treatment options were reviewed.  She has noticed only slight improvement with carpal tunnel splints.  Based on her options she would like to move forward with a right carpal tunnel release.  Risk benefits prognosis reviewed with the patient.  Anticipate out of work for 2 to 4 weeks with possibility of light duty during that time if available.  Follow-Up Instructions: No follow-ups on file.   Orders:  No orders of the defined types were placed in this encounter.  No orders of the defined types were placed in this encounter.     Procedures: No procedures performed   Clinical Data: No additional findings.   Subjective: Chief Complaint  Patient presents with   Left Hand - Follow-up    EMG review   Right Hand - Follow-up    EMG review    HPI Patient is here to review nerve conduction studies.  Review of Systems   Objective: Vital Signs: LMP 06/10/2020   Physical Exam  Ortho Exam Examination of bilateral hands are unchanged. Specialty Comments:  No specialty comments available.  Imaging: No results found.   PMFS History: Patient Active Problem List   Diagnosis Date Noted   Right carpal tunnel syndrome 10/30/2021   Left carpal tunnel syndrome 10/30/2021   Prediabetes 03/31/2021   Menometrorrhagia 06/10/2020   Primary osteoarthritis of left hip 10/18/2019   Vaginal discharge 12/14/2018   Status post HTA endometrial  ablation 04/10/2014   Abnormal uterine bleeding (AUB) 12/18/2013   Past Medical History:  Diagnosis Date   Arthritis    hips   Hypertension     Family History  Problem Relation Age of Onset   Cancer Mother        cervical   Ovarian cancer Sister    Colon cancer Neg Hx    Esophageal cancer Neg Hx    Rectal cancer Neg Hx    Stomach cancer Neg Hx     Past Surgical History:  Procedure Laterality Date   DILATION AND CURETTAGE OF UTERUS N/A 04/27/2017   Procedure: Ultrasound Guided Dilatation and Curettage;  Surgeon: [name], MD;  Location: [facility];  Service: Gynecology;  Laterality: N/A;   ENDOMETRIAL ABLATION     HYSTEROSCOPY N/A 03/12/2014   Procedure: HYSTEROSCOPY WITH HYDROTHERMAL ABLATION;  Surgeon: Osborne Oman, MD;  Location: [facility];  Service: Gynecology;  Laterality: N/A;   TUBAL LIGATION     VAGINAL HYSTERECTOMY Left 06/10/2020   Procedure: HYSTERECTOMY VAGINAL with LEFT Salpingectomy;  Surgeon: Woodroe Mode, MD;  Location: [facility];  Service: Gynecology;  Laterality: Left;   Social History   Occupational History   Not on file  Tobacco Use   Smoking status: Never   Smokeless tobacco: Never  Vaping Use   Vaping Use: Never used  Substance and Sexual Activity   Alcohol use: Yes    Comment: occ   Drug use: No   Sexual activity: Not Currently    Birth control/protection: Surgical

## 2021-11-16 ENCOUNTER — Other Ambulatory Visit: Payer: Self-pay | Admitting: Physician Assistant

## 2021-11-16 ENCOUNTER — Other Ambulatory Visit: Payer: Self-pay

## 2021-11-16 MED ORDER — HYDROCODONE-ACETAMINOPHEN 5-325 MG PO TABS
1.0000 | ORAL_TABLET | Freq: Three times a day (TID) | ORAL | 0 refills | Status: DC | PRN
  Filled 2021-11-16: qty 15, 5d supply, fill #0

## 2021-11-16 MED ORDER — ONDANSETRON HCL 4 MG PO TABS
4.0000 mg | ORAL_TABLET | Freq: Three times a day (TID) | ORAL | 0 refills | Status: DC | PRN
  Filled 2021-11-16: qty 40, 14d supply, fill #0

## 2021-11-18 ENCOUNTER — Other Ambulatory Visit: Payer: Self-pay

## 2021-11-19 DIAGNOSIS — G5601 Carpal tunnel syndrome, right upper limb: Secondary | ICD-10-CM | POA: Diagnosis not present

## 2021-11-26 ENCOUNTER — Ambulatory Visit (INDEPENDENT_AMBULATORY_CARE_PROVIDER_SITE_OTHER): Payer: 59 | Admitting: Physician Assistant

## 2021-11-26 ENCOUNTER — Encounter: Payer: Self-pay | Admitting: Orthopaedic Surgery

## 2021-11-26 DIAGNOSIS — G5601 Carpal tunnel syndrome, right upper limb: Secondary | ICD-10-CM | POA: Diagnosis not present

## 2021-11-26 DIAGNOSIS — Z9889 Other specified postprocedural states: Secondary | ICD-10-CM

## 2021-11-26 NOTE — Progress Notes (Signed)
Post-Op Visit Note   Patient: Renee Collier           Date of Birth: Jul 31, 1970           MRN: [id number] Visit Date: 11/26/2021 PCP: Charlott Rakes, MD   Assessment & Plan:  Chief Complaint:  Chief Complaint  Patient presents with   Right Wrist - Follow-up    Right carpal tunnel release 11/19/2021   Visit Diagnoses:  1. Right carpal tunnel syndrome   2. S/P carpal tunnel release     Plan: Patient is a pleasant 51 year old female who comes in today 1 week status post right carpal tunnel release 11/19/2021.  She has been doing well.  She notes slight discomfort to the hand but nothing more.  Examination of her right hand reveals a well-healed surgical incision with nylon sutures in place.  No evidence of infection or cellulitis.  Fingers are warm and well-perfused.  She is neurovascular intact distally.  Today, her wound was cleaned and recovered.  Velcro splint was applied for which she will wear at all times for the next week.  Follow-up next week for repeat evaluation and suture removal.  We have discussed no heavy lifting or submerging her hand underwater for 3 more weeks.  We will keep her out of work until she is 4 weeks postop.  Call with concerns or questions in the meantime.  Follow-Up Instructions: Return in about 1 week (around 12/03/2021).   Orders:  No orders of the defined types were placed in this encounter.  No orders of the defined types were placed in this encounter.   Imaging: No new imaging  PMFS History: Patient Active Problem List   Diagnosis Date Noted   Right carpal tunnel syndrome 10/30/2021   Left carpal tunnel syndrome 10/30/2021   Prediabetes 03/31/2021   Menometrorrhagia 06/10/2020   Primary osteoarthritis of left hip 10/18/2019   Vaginal discharge 12/14/2018   Status post HTA endometrial ablation 04/10/2014   Abnormal uterine bleeding (AUB) 12/18/2013   Past Medical History:  Diagnosis Date   Arthritis    hips   Hypertension      Family History  Problem Relation Age of Onset   Cancer Mother        cervical   Ovarian cancer Sister    Colon cancer Neg Hx    Esophageal cancer Neg Hx    Rectal cancer Neg Hx    Stomach cancer Neg Hx     Past Surgical History:  Procedure Laterality Date   DILATION AND CURETTAGE OF UTERUS N/A 04/27/2017   Procedure: Ultrasound Guided Dilatation and Curettage;  Surgeon: [name], MD;  Location: [facility];  Service: Gynecology;  Laterality: N/A;   ENDOMETRIAL ABLATION     HYSTEROSCOPY N/A 03/12/2014   Procedure: HYSTEROSCOPY WITH HYDROTHERMAL ABLATION;  Surgeon: Osborne Oman, MD;  Location: [facility];  Service: Gynecology;  Laterality: N/A;   TUBAL LIGATION     VAGINAL HYSTERECTOMY Left 06/10/2020   Procedure: HYSTERECTOMY VAGINAL with LEFT Salpingectomy;  Surgeon: Woodroe Mode, MD;  Location: [facility];  Service: Gynecology;  Laterality: Left;   Social History   Occupational History   Not on file  Tobacco Use   Smoking status: Never   Smokeless tobacco: Never  Vaping Use   Vaping Use: Never used  Substance and Sexual Activity   Alcohol use: Yes    Comment: occ   Drug use: No   Sexual activity: Not Currently    Birth  control/protection: Surgical

## 2021-12-04 ENCOUNTER — Ambulatory Visit (INDEPENDENT_AMBULATORY_CARE_PROVIDER_SITE_OTHER): Payer: Managed Care, Other (non HMO) | Admitting: Orthopaedic Surgery

## 2021-12-04 DIAGNOSIS — G5601 Carpal tunnel syndrome, right upper limb: Secondary | ICD-10-CM

## 2021-12-04 DIAGNOSIS — Z9889 Other specified postprocedural states: Secondary | ICD-10-CM

## 2021-12-04 NOTE — Progress Notes (Signed)
   Post-Op Visit Note   Patient: Renee Collier           Date of Birth: 1970-10-20           MRN: [id number] Visit Date: 12/04/2021 PCP: Charlott Rakes, MD   Assessment & Plan:  Chief Complaint:  Chief Complaint  Patient presents with   Right Hand - Routine Post Op   Visit Diagnoses:  1. S/P carpal tunnel release   2. Right carpal tunnel syndrome     Plan: Ms. Rengel returns today 2 weeks status post right carpal tunnel release on 11/19/2021.  She is doing very well overall and has experienced a significant improvement in her carpal tunnel syndrome.  Surgical incision is healed.  Sutures were removed.  Steri-Strips applied.  Increased activity as tolerated.  Follow-up in 4 weeks for recheck.  Follow-Up Instructions: Return in about 4 weeks (around 01/01/2022).   Orders:  No orders of the defined types were placed in this encounter.  No orders of the defined types were placed in this encounter.   Imaging: No results found.  PMFS History: Patient Active Problem List   Diagnosis Date Noted   Right carpal tunnel syndrome 10/30/2021   Left carpal tunnel syndrome 10/30/2021   Prediabetes 03/31/2021   Menometrorrhagia 06/10/2020   Primary osteoarthritis of left hip 10/18/2019   Vaginal discharge 12/14/2018   Status post HTA endometrial ablation 04/10/2014   Abnormal uterine bleeding (AUB) 12/18/2013   Past Medical History:  Diagnosis Date   Arthritis    hips   Hypertension     Family History  Problem Relation Age of Onset   Cancer Mother        cervical   Ovarian cancer Sister    Colon cancer Neg Hx    Esophageal cancer Neg Hx    Rectal cancer Neg Hx    Stomach cancer Neg Hx     Past Surgical History:  Procedure Laterality Date   DILATION AND CURETTAGE OF UTERUS N/A 04/27/2017   Procedure: Ultrasound Guided Dilatation and Curettage;  Surgeon: [name], MD;  Location: [facility];  Service: Gynecology;  Laterality: N/A;   ENDOMETRIAL  ABLATION     HYSTEROSCOPY N/A 03/12/2014   Procedure: HYSTEROSCOPY WITH HYDROTHERMAL ABLATION;  Surgeon: Osborne Oman, MD;  Location: [facility];  Service: Gynecology;  Laterality: N/A;   TUBAL LIGATION     VAGINAL HYSTERECTOMY Left 06/10/2020   Procedure: HYSTERECTOMY VAGINAL with LEFT Salpingectomy;  Surgeon: Woodroe Mode, MD;  Location: [facility];  Service: Gynecology;  Laterality: Left;   Social History   Occupational History   Not on file  Tobacco Use   Smoking status: Never   Smokeless tobacco: Never  Vaping Use   Vaping Use: Never used  Substance and Sexual Activity   Alcohol use: Yes    Comment: occ   Drug use: No   Sexual activity: Not Currently    Birth control/protection: Surgical

## 2021-12-07 ENCOUNTER — Ambulatory Visit: Payer: Self-pay | Admitting: *Deleted

## 2021-12-07 NOTE — Telephone Encounter (Signed)
Summary: Advice fatigue and insomnia   Pt is calling for advice - pt report that she fatigue and insomia. Please advise          Chief Complaint: insomnia and fatigue Symptoms: difficulty sleeping and staying asleep. Hot flashes, only sleeps approx 5-6 hours at night. Wakes up feeling tired, worn out, irritable. S/p carpel tunnel surgery and started having trouble sleeping. Reports feeling tired. Has tried melatonin ineffective Frequency: 1 month or greater Pertinent Negatives: Patient denies na  Disposition: '[]'$ ED /'[]'$ Urgent Care (no appt availability in office) / '[x]'$ Appointment(In office/virtual)/ '[]'$  [facility]/ '[]'$ Home Care/ '[]'$ Refused Recommended Disposition /'[]'$ [facility]/ '[]'$  Follow-up with PCP Additional Notes:   Requesting advise regarding OTC or prescribed medications to help sleep. Appt scheduled for 12/30/21. Earliest available. Patient would like a call back for recommendations for sleep.      Reason for Disposition  Insomnia is an ongoing problem (> 2 weeks)  Answer Assessment - Initial Assessment Questions 1. DESCRIPTION: "Tell me about your sleeping problem."      Trouble sleeping , hot flashes, wake up feeling tired 2. ONSET: "How long have you been having trouble sleeping?" (e.g., days, weeks, months)     Approx. 1 month or more  3. RECURRENT: "Have you had sleeping problems before?"  If Yes, ask: "What happened that time?" "What helped your sleeping problem go away in the past?"      Yes since carpel tunnel surgery  4. STRESS: "Is there anything in your life that is making you feel stressed or tense?"     Regarding sleep and has stress she will not fall asleep  5. PAIN: "Do you have any pain that is keeping you awake?" (e.g., back pain, headache, abdomen pain)     No pain , hot flashes  6. CAFFEINE ABUSE: "Do you drink caffeinated beverages, and how much each day?" (e.g., coffee, tea, colas)     Na  7. ALCOHOL USE OR SUBSTANCE USE (DRUG USE):  "Do you drink alcohol or use any illegal drugs?"     na 8. OTHER SYMPTOMS: "Do you have any other symptoms?"  (e.g., difficulty breathing)     Hot flashes, sleeping only 5-6 hours . Feels warn out upon awakening and irritable  Protocols used: Insomnia-A-AH

## 2021-12-08 NOTE — Telephone Encounter (Signed)
OTC melatonin can be used in the meantime until her office visit.

## 2021-12-09 NOTE — Telephone Encounter (Signed)
Patient was made aware of message per Dr. Margarita Rana.

## 2021-12-10 ENCOUNTER — Other Ambulatory Visit (HOSPITAL_COMMUNITY): Payer: Self-pay

## 2021-12-17 ENCOUNTER — Encounter (HOSPITAL_COMMUNITY): Payer: Self-pay

## 2021-12-17 ENCOUNTER — Other Ambulatory Visit (HOSPITAL_COMMUNITY): Payer: Self-pay

## 2021-12-21 ENCOUNTER — Other Ambulatory Visit (HOSPITAL_COMMUNITY): Payer: Self-pay

## 2021-12-30 ENCOUNTER — Ambulatory Visit: Payer: 59 | Admitting: Physician Assistant

## 2022-01-05 ENCOUNTER — Ambulatory Visit (INDEPENDENT_AMBULATORY_CARE_PROVIDER_SITE_OTHER): Payer: 59 | Admitting: Physician Assistant

## 2022-01-05 DIAGNOSIS — G5601 Carpal tunnel syndrome, right upper limb: Secondary | ICD-10-CM

## 2022-01-05 DIAGNOSIS — Z9889 Other specified postprocedural states: Secondary | ICD-10-CM

## 2022-01-05 NOTE — Progress Notes (Signed)
   Post-Op Visit Note   Patient: Renee Collier           Date of Birth: 01-27-1971           MRN: [id number] Visit Date: 01/05/2022 PCP: Charlott Rakes, MD   Assessment & Plan:  Chief Complaint:  Chief Complaint  Patient presents with   Right Hand - Pain   Visit Diagnoses:  1. Right carpal tunnel syndrome   2. S/P carpal tunnel release     Plan: Patient is a pleasant 51 year old female who comes in today 6 weeks status post right carpal tunnel release 11/19/2021.  She has been doing great.  No pain and no paresthesias.  She has returned back to work without any issues.  She does mention wanting to proceed with left carpal tunnel release but not until sometime next year.  She will follow-up with [place] to further discuss around that time.  Call with concerns or questions in the meantime.  Follow-Up Instructions: Return if symptoms worsen or fail to improve.   Orders:  No orders of the defined types were placed in this encounter.  No orders of the defined types were placed in this encounter.   Imaging: No new imaging  PMFS History: Patient Active Problem List   Diagnosis Date Noted   Right carpal tunnel syndrome 10/30/2021   Left carpal tunnel syndrome 10/30/2021   Prediabetes 03/31/2021   Menometrorrhagia 06/10/2020   Primary osteoarthritis of left hip 10/18/2019   Vaginal discharge 12/14/2018   Status post HTA endometrial ablation 04/10/2014   Abnormal uterine bleeding (AUB) 12/18/2013   Past Medical History:  Diagnosis Date   Arthritis    hips   Hypertension     Family History  Problem Relation Age of Onset   Cancer Mother        cervical   Ovarian cancer Sister    Colon cancer Neg Hx    Esophageal cancer Neg Hx    Rectal cancer Neg Hx    Stomach cancer Neg Hx     Past Surgical History:  Procedure Laterality Date   DILATION AND CURETTAGE OF UTERUS N/A 04/27/2017   Procedure: Ultrasound Guided Dilatation and Curettage;  Surgeon: [name], MD;   Location: [facility];  Service: Gynecology;  Laterality: N/A;   ENDOMETRIAL ABLATION     HYSTEROSCOPY N/A 03/12/2014   Procedure: HYSTEROSCOPY WITH HYDROTHERMAL ABLATION;  Surgeon: Osborne Oman, MD;  Location: [facility];  Service: Gynecology;  Laterality: N/A;   TUBAL LIGATION     VAGINAL HYSTERECTOMY Left 06/10/2020   Procedure: HYSTERECTOMY VAGINAL with LEFT Salpingectomy;  Surgeon: Woodroe Mode, MD;  Location: [facility];  Service: Gynecology;  Laterality: Left;   Social History   Occupational History   Not on file  Tobacco Use   Smoking status: Never   Smokeless tobacco: Never  Vaping Use   Vaping Use: Never used  Substance and Sexual Activity   Alcohol use: Yes    Comment: occ   Drug use: No   Sexual activity: Not Currently    Birth control/protection: Surgical

## 2022-01-21 ENCOUNTER — Other Ambulatory Visit: Payer: Self-pay | Admitting: Family Medicine

## 2022-01-21 DIAGNOSIS — Z1231 Encounter for screening mammogram for malignant neoplasm of breast: Secondary | ICD-10-CM

## 2022-02-02 IMAGING — MG DIGITAL SCREENING BILAT W/ TOMO W/ CAD
8 series · 8 of 24 positions shown · non-contrast
Comparison: Previous exam(s).

CLINICAL DATA: Screening.

EXAM:
DIGITAL SCREENING BILATERAL MAMMOGRAM WITH TOMO AND CAD

[R MLO synth-2D]
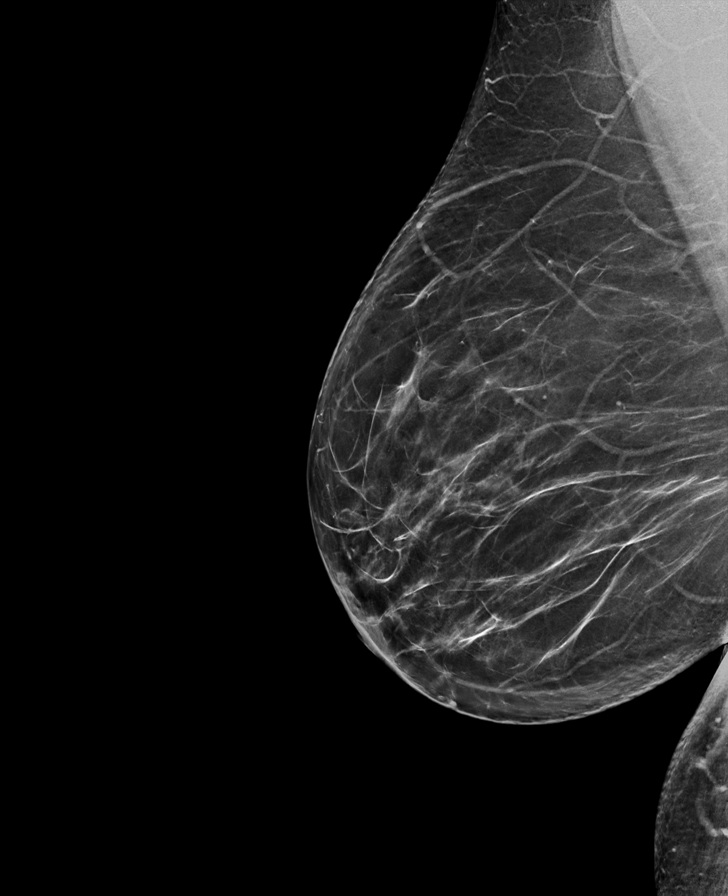

[L MLO synth-2D]
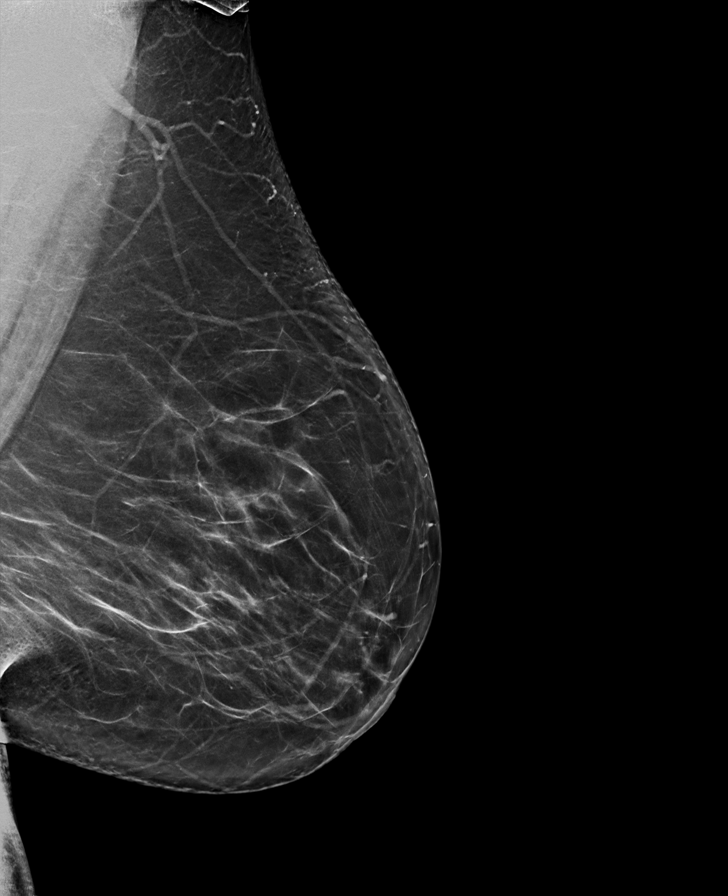

[R CC synth-2D]
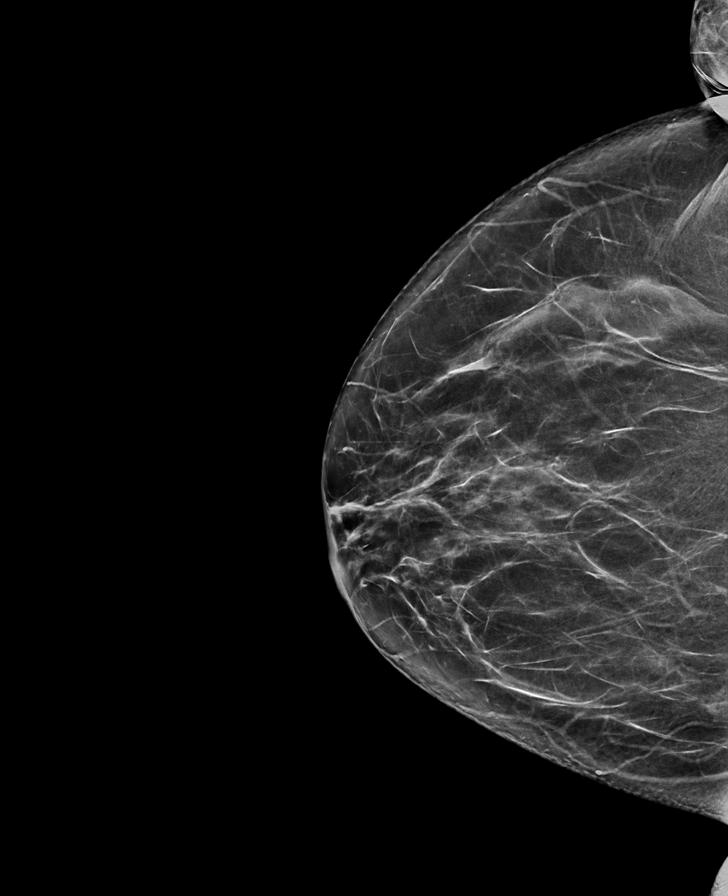

[L CC synth-2D]
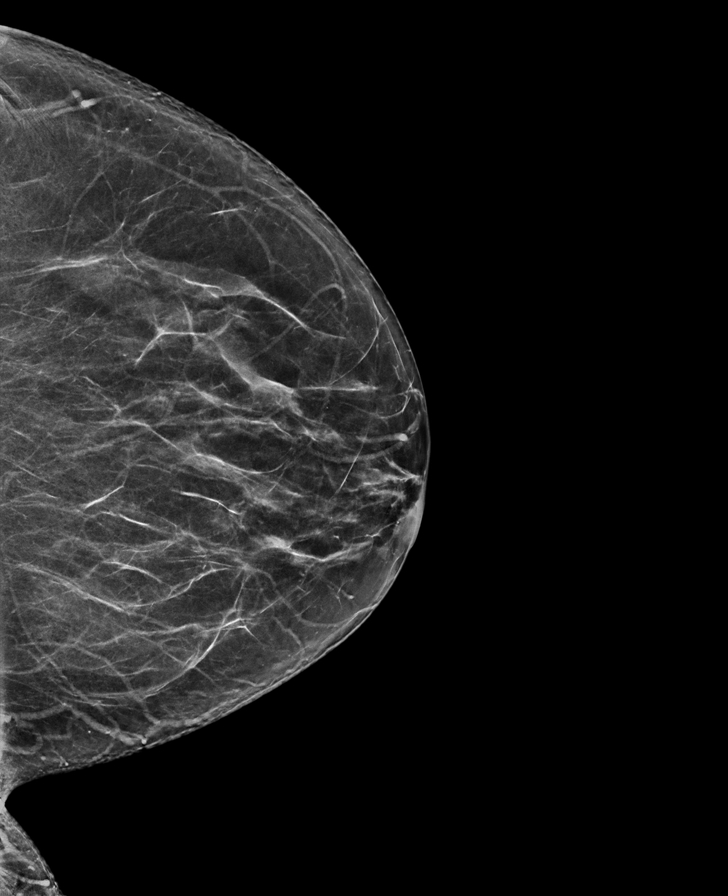

[R MLO tomo · tomo slice 39/77.0]
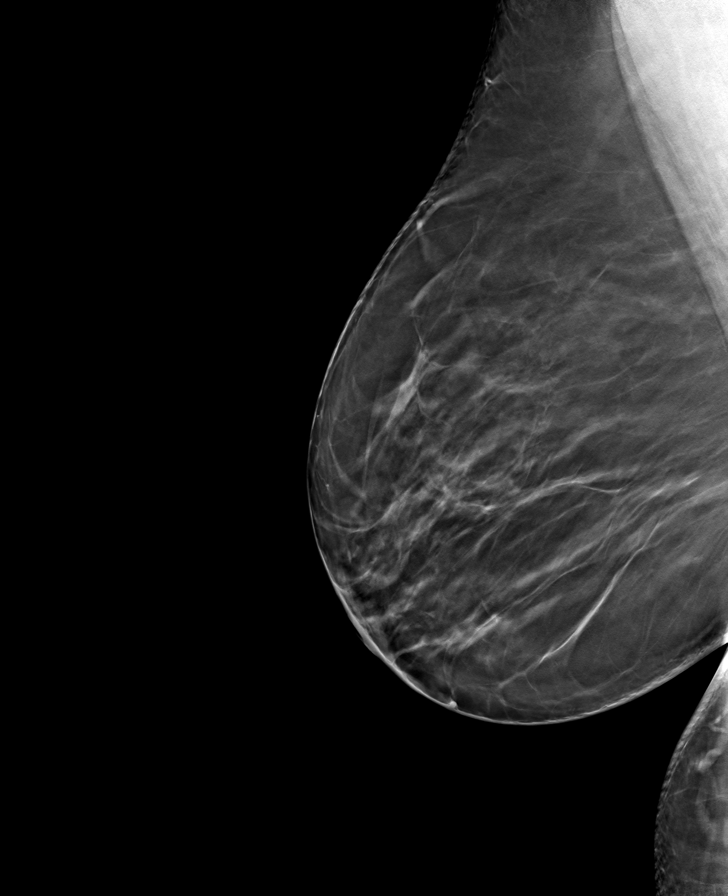

[L MLO tomo · tomo slice 42/83.0]
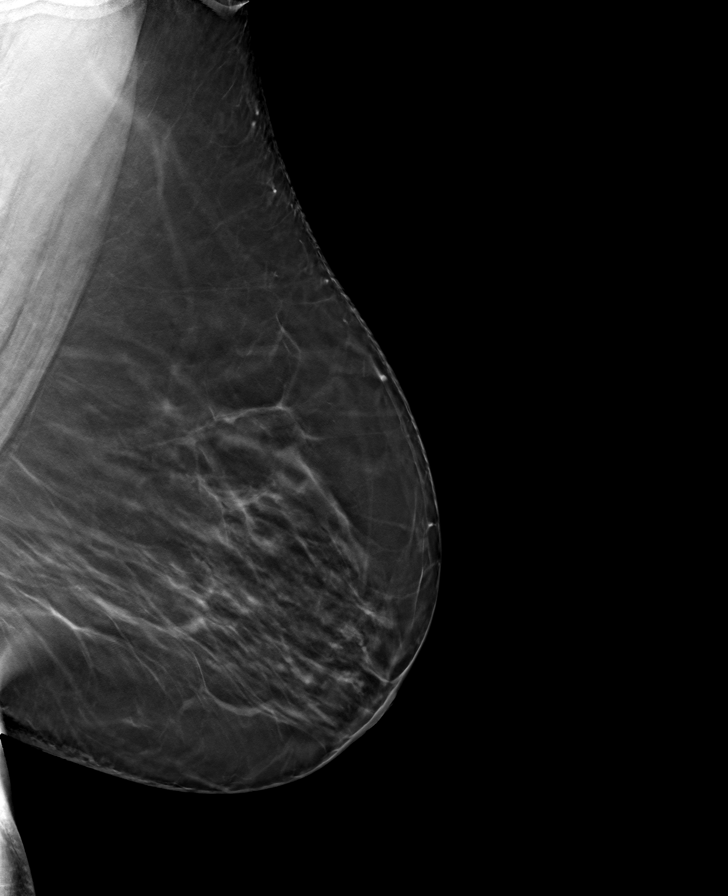

[L CC tomo · tomo slice 35/69.0]
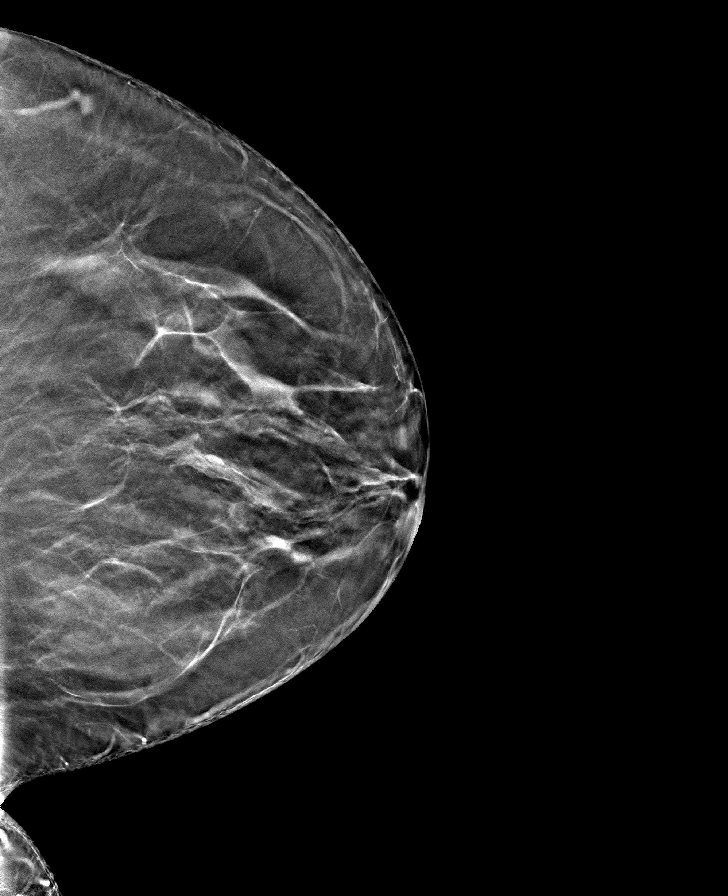

[R CC tomo · tomo slice 35/70.0]
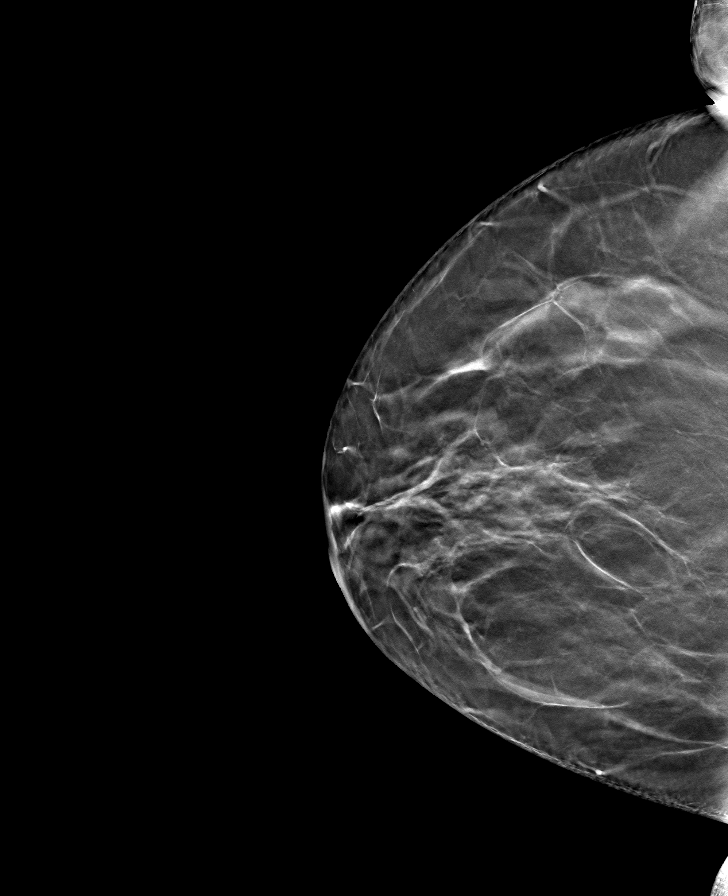

[8 of 24 positions shown; findings below may reference images not displayed]

ACR Breast Density Category b: There are scattered areas of
fibroglandular density.
FINDINGS: There are no findings suspicious for malignancy. Images were
processed with CAD.
IMPRESSION: No mammographic evidence of malignancy. A result letter of this
screening mammogram will be mailed directly to the patient.

RECOMMENDATION:
Screening mammogram in one year. (Code:[id number])

BI-RADS CATEGORY  1: Negative.

## 2022-02-05 ENCOUNTER — Ambulatory Visit: Payer: Self-pay

## 2022-02-05 NOTE — Telephone Encounter (Signed)
Pt called reporting that she has more hip pain and her meloxicam does not seem to help relieve her. Please advise, pt is seeking med advice for alternative options   Chief Complaint: Left hip. Meloxicam is not "helping much." Asking for a different medication. Symptoms: Above Frequency: Started feeling worse Wednesday Pertinent Negatives: Patient denies  Disposition: '[]'$ ED /'[]'$ Urgent Care (no appt availability in office) / '[]'$ Appointment(In office/virtual)/ '[]'$  [facility]/ '[]'$ Home Care/ '[]'$ Refused Recommended Disposition /'[]'$ [facility]/ '[x]'$  Follow-up with PCP Additional Notes: Please advise pt.  Answer Assessment - Initial Assessment Questions 1. LOCATION and RADIATION: "Where is the pain located?"      Left hip 2. QUALITY: "What does the pain feel like?"  (e.g., sharp, dull, aching, burning)     Throbbing 3. SEVERITY: "How bad is the pain?" "What does it keep you from doing?"   (Scale 1-10; or mild, moderate, severe)   -  MILD (1-3): doesn't interfere with normal activities    -  MODERATE (4-7): interferes with normal activities (e.g., work or school) or awakens from sleep, limping    -  SEVERE (8-10): excruciating pain, unable to do any normal activities, unable to walk     10 4. ONSET: "When did the pain start?" "Does it come and go, or is it there all the time?"     Last Wednesday 5. WORK OR EXERCISE: "Has there been any recent work or exercise that involved this part of the body?"      No 6. CAUSE: "What do you think is causing the hip pain?"      Arthritis 7. AGGRAVATING FACTORS: "What makes the hip pain worse?" (e.g., walking, climbing stairs, running)     Walking 8. OTHER SYMPTOMS: "Do you have any other symptoms?" (e.g., back pain, pain shooting down leg,  fever, rash)     Pain  Protocols used: Hip Pain-A-AH

## 2022-02-05 NOTE — Telephone Encounter (Signed)
Appointment scheduled(Virtual) with Dr Margarita Rana

## 2022-02-08 ENCOUNTER — Other Ambulatory Visit: Payer: Self-pay

## 2022-02-08 ENCOUNTER — Encounter: Payer: Self-pay | Admitting: Family Medicine

## 2022-02-08 ENCOUNTER — Telehealth (HOSPITAL_BASED_OUTPATIENT_CLINIC_OR_DEPARTMENT_OTHER): Payer: 59 | Admitting: Family Medicine

## 2022-02-08 ENCOUNTER — Other Ambulatory Visit (HOSPITAL_COMMUNITY): Payer: Self-pay

## 2022-02-08 DIAGNOSIS — M1612 Unilateral primary osteoarthritis, left hip: Secondary | ICD-10-CM

## 2022-02-08 DIAGNOSIS — I10 Essential (primary) hypertension: Secondary | ICD-10-CM

## 2022-02-08 DIAGNOSIS — R7303 Prediabetes: Secondary | ICD-10-CM

## 2022-02-08 DIAGNOSIS — M5432 Sciatica, left side: Secondary | ICD-10-CM

## 2022-02-08 DIAGNOSIS — Z13220 Encounter for screening for lipoid disorders: Secondary | ICD-10-CM

## 2022-02-08 DIAGNOSIS — J0100 Acute maxillary sinusitis, unspecified: Secondary | ICD-10-CM | POA: Diagnosis not present

## 2022-02-08 MED ORDER — FLUTICASONE PROPIONATE 50 MCG/ACT NA SUSP
2.0000 | Freq: Every day | NASAL | 1 refills | Status: DC
  Filled 2022-02-08: qty 16, 30d supply, fill #0

## 2022-02-08 MED ORDER — MELOXICAM 7.5 MG PO TABS
7.5000 mg | ORAL_TABLET | Freq: Every day | ORAL | 2 refills | Status: DC
  Filled 2022-02-08 (×2): qty 30, 30d supply, fill #0

## 2022-02-08 MED ORDER — CYCLOBENZAPRINE HCL 10 MG PO TABS
10.0000 mg | ORAL_TABLET | Freq: Two times a day (BID) | ORAL | 2 refills | Status: DC | PRN
  Filled 2022-02-08: qty 60, 30d supply, fill #0

## 2022-02-08 NOTE — Progress Notes (Signed)
Virtual Visit via Video Note  I connected with Renee Collier, on 02/08/2022 at 8:13 AM by video enabled telemedicine device and verified that I am speaking with the correct person using two identifiers.   Consent: I discussed the limitations, risks, security and privacy concerns of performing an evaluation and management service by telemedicine and the availability of in person appointments. I also discussed with the patient that there may be a patient responsible charge related to this service. The patient expressed understanding and agreed to proceed.   Location of Patient: Emergency planning/management officer of Provider: Clinic   Persons participating in Telemedicine visit: Renee Collier Dr. Margarita Rana     History of Present Illness: Renee Collier is a 52 y.o. year old female  with history of hypertension seen for an acute visit.   Last Wed her left hip started hurting and Meloxicam has not been helping. Pain feels like throbbing and like someone stabs her in the hip. Sometimes it radiates down her LEFT LOWER EXTREMITY.  He does have relief when she is off her feet but as soon as she stands the pain returns. She was seen by Dr. Erlinda Hong of orthopedics last year, diagnosed with osteoarthritis and received a cortisone injection in her left hip,  She complains of her nose being stopped up and her head is clogged up with associated headaches, facial pressure, postnasal drip. She used Mucinex with no much relief.  Symptoms started yesterday.  She has no fever, no myalgias but has been fatigued a bit. Past Medical History:  Diagnosis Date   Arthritis    hips   Hypertension    No Known Allergies  Current Outpatient Medications on File Prior to Visit  Medication Sig Dispense Refill   cyclobenzaprine (FLEXERIL) 10 MG tablet Take 1 tablet (10 mg total) by mouth 2 (two) times daily as needed for muscle spasms. 60 tablet 2   HYDROcodone-acetaminophen (NORCO) 5-325 MG tablet Take 1 tablet by mouth 3 (three) times  daily as needed. 15 tablet 0   lisinopril-hydrochlorothiazide (ZESTORETIC) 10-12.5 MG tablet Take 1 tablet by mouth daily. 90 tablet 1   meloxicam (MOBIC) 7.5 MG tablet Take 1 tablet (7.5 mg total) by mouth daily. 30 tablet 2   ondansetron (ZOFRAN) 4 MG tablet Take 1 tablet (4 mg total) by mouth every 8 (eight) hours as needed for nausea or vomiting. 40 tablet 0   No current facility-administered medications on file prior to visit.    ROS: See HPI  Observations/Objective: Awake, alert, oriented x3 Not in acute distress Tenderness on palpation of sinuses Normal mood      Latest Ref Rng & Units 09/23/2021    4:01 PM 03/31/2021    9:43 AM 02/13/2021   11:00 AM  CMP  Glucose 70 - 99 mg/dL 107  98  97   BUN 6 - 24 mg/dL '11  9  7   '$ Creatinine 0.57 - 1.00 mg/dL 0.69  0.77  0.77   Sodium 134 - 144 mmol/L 141  138  137   Potassium 3.5 - 5.2 mmol/L 4.0  4.2  3.2   Chloride 96 - 106 mmol/L 100  106  102   CO2 20 - 29 mmol/L '23  24  28   '$ Calcium 8.7 - 10.2 mg/dL 10.3  9.6  9.0   Total Protein 6.0 - 8.5 g/dL  7.4  7.0   Total Bilirubin 0.0 - 1.2 mg/dL  0.3  1.0   Alkaline Phos 44 - 121 IU/L  113  88   AST 0 - 40 IU/L  8  13   ALT 0 - 32 IU/L  6  8     Lipid Panel     Component Value Date/Time   CHOL 149 03/31/2021 0943   TRIG 91 03/31/2021 0943   HDL 51 03/31/2021 0943   CHOLHDL 3.2 01/01/2020 1414   CHOLHDL 2.4 Ratio 11/23/2006 2033   VLDL 20 11/23/2006 2033   LDLCALC 81 03/31/2021 0943   LABVLDL 17 03/31/2021 0943    Lab Results  Component Value Date   HGBA1C 5.8 03/31/2021     Assessment and Plan: 1. Left sciatic nerve pain Underlying left hip osteoarthritis Status post cortisone injection 4 months ago Will refill Flexeril which she will use in addition to meloxicam - cyclobenzaprine (FLEXERIL) 10 MG tablet; Take 1 tablet (10 mg total) by mouth 2 (two) times daily as needed for muscle spasms.  Dispense: 60 tablet; Refill: 2 - meloxicam (MOBIC) 7.5 MG tablet; Take 1  tablet (7.5 mg total) by mouth daily.  Dispense: 30 tablet; Refill: 2  2. Acute non-recurrent maxillary sinusitis Counseled on conservative management of sinusitis Continue decongestants along with Flonase If symptoms persist by day 5 consider antibiotics - fluticasone (FLONASE) 50 MCG/ACT nasal spray; Place 2 sprays into both nostrils daily.  Dispense: 16 g; Refill: 1  3. Primary osteoarthritis of left hip See #1 above Status post cortisone injection - CBC with Differential/Platelet; Future - AMB referral to orthopedics - Ambulatory referral to Physical Therapy  4. Encounter for lipid screening for cardiovascular disease - LP+Non-HDL Cholesterol; Future  5. Prediabetes Labs reveal prediabetes with an A1c of 5.8.  Working on a low carbohydrate diet, exercise, weight loss is recommended in order to prevent progression to type 2 diabetes mellitus. - Hemoglobin A1c; Future  6. Hypertension, unspecified type She needs an in person visit to evaluate her blood pressure control Will send her for labs given she is on an NSAID - CMP14+EGFR; Future  Follow Up Instructions: Advised to keep upcoming appointment for 04/2022    I discussed the assessment and treatment plan with the patient. The patient was provided an opportunity to ask questions and all were answered. The patient agreed with the plan and demonstrated an understanding of the instructions.   The patient was advised to call back or seek an in-person evaluation if the symptoms worsen or if the condition fails to improve as anticipated.     I provided 13 minutes total of Telehealth time during this encounter including median intraservice time, reviewing previous notes, investigations, ordering medications, medical decision making, coordinating care and patient verbalized understanding at the end of the visit.     Charlott Rakes, MD, FAAFP. [facility] and [facility] [place], [place]    02/08/2022, 8:13 AM

## 2022-02-10 ENCOUNTER — Other Ambulatory Visit: Payer: Self-pay

## 2022-02-10 ENCOUNTER — Ambulatory Visit: Payer: 59 | Attending: Family Medicine

## 2022-02-10 DIAGNOSIS — Z13220 Encounter for screening for lipoid disorders: Secondary | ICD-10-CM | POA: Diagnosis not present

## 2022-02-10 DIAGNOSIS — M1612 Unilateral primary osteoarthritis, left hip: Secondary | ICD-10-CM

## 2022-02-10 DIAGNOSIS — Z136 Encounter for screening for cardiovascular disorders: Secondary | ICD-10-CM | POA: Diagnosis not present

## 2022-02-10 DIAGNOSIS — R7303 Prediabetes: Secondary | ICD-10-CM

## 2022-02-10 DIAGNOSIS — I10 Essential (primary) hypertension: Secondary | ICD-10-CM | POA: Diagnosis not present

## 2022-02-11 ENCOUNTER — Other Ambulatory Visit (HOSPITAL_COMMUNITY): Payer: Self-pay

## 2022-02-11 LAB — LP+NON-HDL CHOLESTEROL
Cholesterol, Total: 143 mg/dL (ref 100–199)
HDL: 49 mg/dL (ref >39)
LDL Chol Calc (NIH): 78 mg/dL (ref 0–99)
Total Non-HDL-Chol (LDL+VLDL): 94 mg/dL (ref 0–129)
Triglycerides: 80 mg/dL (ref 0–149)
VLDL Cholesterol Cal: 16 mg/dL (ref 5–40)

## 2022-02-11 LAB — CMP14+EGFR
ALT: 7 IU/L (ref 0–32)
AST: 13 IU/L (ref 0–40)
Albumin/Globulin Ratio: 1.5 (ref 1.2–2.2)
Albumin: 4.1 g/dL (ref 3.9–4.9)
Alkaline Phosphatase: 97 IU/L (ref 44–121)
BUN/Creatinine Ratio: 11 (ref 9–23)
BUN: 8 mg/dL (ref 6–24)
Bilirubin Total: 0.2 mg/dL (ref 0.0–1.2)
CO2: 22 mmol/L (ref 20–29)
Calcium: 8.8 mg/dL (ref 8.7–10.2)
Chloride: 103 mmol/L (ref 96–106)
Creatinine, Ser: 0.73 mg/dL (ref 0.57–1.00)
Globulin, Total: 2.8 g/dL (ref 1.5–4.5)
Glucose: 96 mg/dL (ref 70–99)
Potassium: 4.2 mmol/L (ref 3.5–5.2)
Sodium: 140 mmol/L (ref 134–144)
Total Protein: 6.9 g/dL (ref 6.0–8.5)
eGFR: 101 mL/min/{1.73_m2} (ref >59)

## 2022-02-11 LAB — HEMOGLOBIN A1C
Est. average glucose Bld gHb Est-mCnc: 114 mg/dL
Hgb A1c MFr Bld: 5.6 % (ref 4.8–5.6)

## 2022-02-11 LAB — CBC WITH DIFFERENTIAL/PLATELET
Basophils Absolute: 0 10*3/uL (ref 0.0–0.2)
Basos: 1 %
EOS (ABSOLUTE): 0.1 10*3/uL (ref 0.0–0.4)
Eos: 3 %
Hematocrit: 40.5 % (ref 34.0–46.6)
Hemoglobin: 13.4 g/dL (ref 11.1–15.9)
Immature Grans (Abs): 0 10*3/uL (ref 0.0–0.1)
Immature Granulocytes: 0 %
Lymphocytes Absolute: 3.6 10*3/uL — ABNORMAL HIGH (ref 0.7–3.1)
Lymphs: 65 %
MCH: 30 pg (ref 26.6–33.0)
MCHC: 33.1 g/dL (ref 31.5–35.7)
MCV: 91 fL (ref 79–97)
Monocytes Absolute: 0.3 10*3/uL (ref 0.1–0.9)
Monocytes: 6 %
Neutrophils Absolute: 1.4 10*3/uL (ref 1.4–7.0)
Neutrophils: 25 %
Platelets: 327 10*3/uL (ref 150–450)
RBC: 4.46 x10E6/uL (ref 3.77–5.28)
RDW: 13.7 % (ref 11.7–15.4)
WBC: 5.6 10*3/uL (ref 3.4–10.8)

## 2022-02-17 ENCOUNTER — Other Ambulatory Visit: Payer: Self-pay

## 2022-02-18 ENCOUNTER — Encounter: Payer: Self-pay | Admitting: Orthopaedic Surgery

## 2022-02-18 ENCOUNTER — Ambulatory Visit (INDEPENDENT_AMBULATORY_CARE_PROVIDER_SITE_OTHER): Payer: 59 | Admitting: Sports Medicine

## 2022-02-18 ENCOUNTER — Ambulatory Visit (INDEPENDENT_AMBULATORY_CARE_PROVIDER_SITE_OTHER): Payer: 59 | Admitting: Orthopaedic Surgery

## 2022-02-18 ENCOUNTER — Ambulatory Visit: Payer: Self-pay

## 2022-02-18 DIAGNOSIS — M1612 Unilateral primary osteoarthritis, left hip: Secondary | ICD-10-CM | POA: Diagnosis not present

## 2022-02-18 MED ORDER — LIDOCAINE HCL 1 % IJ SOLN
4.0000 mL | INTRAMUSCULAR | Status: AC | PRN
  Administered 2022-02-18: 4 mL

## 2022-02-18 MED ORDER — METHYLPREDNISOLONE ACETATE 40 MG/ML IJ SUSP
80.0000 mg | INTRAMUSCULAR | Status: AC | PRN
  Administered 2022-02-18: 80 mg via INTRA_ARTICULAR

## 2022-02-18 NOTE — Progress Notes (Signed)
Office Visit Note   Patient: Renee Collier           Date of Birth: February 21, 1970           MRN: [id number] Visit Date: 02/18/2022              Requested by: Charlott Rakes, MD [place],  [place] [address] PCP: Charlott Rakes, MD   Assessment & Plan: Visit Diagnoses:  1. Primary osteoarthritis of left hip     Plan: Impression is advanced left hip degenerative joint disease with bone-on-bone joint space narrowing.  We will repeat the cortisone injection today.  Hopefully this will continue to be effective.  She understands that ultimately she is facing a total hip replacement.  Follow-Up Instructions: No follow-ups on file.   Orders:  No orders of the defined types were placed in this encounter.  No orders of the defined types were placed in this encounter.     Procedures: No procedures performed   Clinical Data: No additional findings.   Subjective: Chief Complaint  Patient presents with   Left Hip - Pain    HPI Ms. Sharples returns today for recurrent left hip pain that started about a week ago.  Her last and only cortisone injection was done on 10/01/2021 by Dr. Rolena Infante.  She been using meloxicam and muscle relaxer.  She is having night pain is waking her from sleeping.  Requesting another injection.  Review of Systems   Objective: Vital Signs: LMP 06/10/2020   Physical Exam  Ortho Exam Examination of left hip is unchanged. Specialty Comments:  No specialty comments available.  Imaging: No results found.   PMFS History: Patient Active Problem List   Diagnosis Date Noted   Right carpal tunnel syndrome 10/30/2021   Left carpal tunnel syndrome 10/30/2021   Prediabetes 03/31/2021   Menometrorrhagia 06/10/2020   Primary osteoarthritis of left hip 10/18/2019   Vaginal discharge 12/14/2018   Status post HTA endometrial ablation 04/10/2014   Abnormal uterine bleeding (AUB) 12/18/2013   Past Medical History:  Diagnosis Date    Arthritis    hips   Hypertension     Family History  Problem Relation Age of Onset   Cancer Mother        cervical   Ovarian cancer Sister    Colon cancer Neg Hx    Esophageal cancer Neg Hx    Rectal cancer Neg Hx    Stomach cancer Neg Hx     Past Surgical History:  Procedure Laterality Date   DILATION AND CURETTAGE OF UTERUS N/A 04/27/2017   Procedure: Ultrasound Guided Dilatation and Curettage;  Surgeon: [name], MD;  Location: [facility];  Service: Gynecology;  Laterality: N/A;   ENDOMETRIAL ABLATION     HYSTEROSCOPY N/A 03/12/2014   Procedure: HYSTEROSCOPY WITH HYDROTHERMAL ABLATION;  Surgeon: Osborne Oman, MD;  Location: [facility];  Service: Gynecology;  Laterality: N/A;   TUBAL LIGATION     VAGINAL HYSTERECTOMY Left 06/10/2020   Procedure: HYSTERECTOMY VAGINAL with LEFT Salpingectomy;  Surgeon: Woodroe Mode, MD;  Location: [facility];  Service: Gynecology;  Laterality: Left;   Social History   Occupational History   Not on file  Tobacco Use   Smoking status: Never   Smokeless tobacco: Never  Vaping Use   Vaping Use: Never used  Substance and Sexual Activity   Alcohol use: Yes    Comment: occ   Drug use: No   Sexual activity:  Not Currently    Birth control/protection: Surgical

## 2022-02-18 NOTE — Progress Notes (Signed)
   Procedure Note  Patient: Renee Collier             Date of Birth: [birth year]           MRN: [id number]             Visit Date: 02/18/2022  Procedures: Visit Diagnoses:  1. Primary osteoarthritis of left hip    Large Joint Inj: L hip joint on 02/18/2022 9:45 AM Indications: pain Details: 22 G 3.5 in needle, ultrasound-guided anterior approach Medications: 4 mL lidocaine 1 %; 80 mg methylPREDNISolone acetate 40 MG/ML Outcome: tolerated well, no immediate complications  Procedure: US-guided intra-articular hip injection, left After discussion on risks/benefits/indications and informed verbal consent was obtained, a timeout was performed. Patient was lying supine on exam table. The hip was cleaned with betadine and alcohol swabs. Then utilizing ultrasound guidance, the patient's femoral head and neck junction was identified and subsequently injected with 4:2 lidocaine:depomedrol via an in-plane approach with ultrasound visualization of the injectate administered into the hip joint. Patient tolerated procedure well without immediate complications.  Procedure, treatment alternatives, risks and benefits explained, specific risks discussed. Consent was given by the patient. Immediately prior to procedure a time out was called to verify the correct patient, procedure, equipment, support staff and site/side marked as required. Patient was prepped and draped in the usual sterile fashion.    - I evaluated the patient about 10 minutes post-injection and she had excellent improvement in pain and range of motion - follow-up with Dr.  Erlinda Hong as indicated; I am happy to see them as needed  Elba Barman, DO [place]  This note was dictated using Dragon naturally speaking software and may contain errors in syntax, spelling, or content which have not been identified prior to signing this note.

## 2022-02-23 NOTE — Therapy (Signed)
OUTPATIENT PHYSICAL THERAPY LOWER EXTREMITY EVALUATION   Patient Name: Renee Collier MRN: [id number] DOB:1970/09/04, 52 y.o., female Today's Date: 02/23/2022  END OF SESSION:   Past Medical History:  Diagnosis Date   Arthritis    hips   Hypertension    Past Surgical History:  Procedure Laterality Date   DILATION AND CURETTAGE OF UTERUS N/A 04/27/2017   Procedure: Ultrasound Guided Dilatation and Curettage;  Surgeon: [name], MD;  Location: [facility];  Service: Gynecology;  Laterality: N/A;   ENDOMETRIAL ABLATION     HYSTEROSCOPY N/A 03/12/2014   Procedure: HYSTEROSCOPY WITH HYDROTHERMAL ABLATION;  Surgeon: Osborne Oman, MD;  Location: [facility];  Service: Gynecology;  Laterality: N/A;   TUBAL LIGATION     VAGINAL HYSTERECTOMY Left 06/10/2020   Procedure: HYSTERECTOMY VAGINAL with LEFT Salpingectomy;  Surgeon: Woodroe Mode, MD;  Location: [facility];  Service: Gynecology;  Laterality: Left;   Patient Active Problem List   Diagnosis Date Noted   Right carpal tunnel syndrome 10/30/2021   Left carpal tunnel syndrome 10/30/2021   Prediabetes 03/31/2021   Menometrorrhagia 06/10/2020   Primary osteoarthritis of left hip 10/18/2019   Vaginal discharge 12/14/2018   Status post HTA endometrial ablation 04/10/2014   Abnormal uterine bleeding (AUB) 12/18/2013    PCP: Charlott Rakes, MD  REFERRING PROVIDER: Charlott Rakes, MD  REFERRING DIAG: Primary osteoarthritis of left hip  THERAPY DIAG:  No diagnosis found.  Rationale for Evaluation and Treatment: Rehabilitation  ONSET DATE: ***   SUBJECTIVE:  SUBJECTIVE STATEMENT: ***  PERTINENT HISTORY: ***  PAIN:  Are you having pain? Yes:  NPRS scale: ***/10 Pain location: Left hip Pain description: *** Aggravating factors: *** Relieving factors: ***  PRECAUTIONS: None  WEIGHT BEARING RESTRICTIONS: No  FALLS:  Has patient fallen in last 6 months? {fallsyesno:27318}  LIVING ENVIRONMENT: Lives  with: {[facility] lives with:25569::"lives with their family"} Lives in: {Lives in:25570} Stairs: {opstairs:27293} Has following equipment at home: {Assistive devices:23999}  OCCUPATION: ***  PLOF: Independent  PATIENT GOALS: ***   OBJECTIVE:  DIAGNOSTIC FINDINGS:   Imaging: XR HIP UNILAT W OR W/O PELVIS 2-3 VIEWS LEFT Result Date: 10/01/2021 Moderate degenerative changes to the left hip joint   PATIENT SURVEYS:  FOTO ***  COGNITION: Overall cognitive status: Within functional limits for tasks assessed     SENSATION: WFL  MUSCLE LENGTH: ***  POSTURE:   ***  PALPATION: ***  LOWER EXTREMITY ROM:  {AROM/PROM:27142} ROM Right eval Left eval  Hip flexion    Hip extension    Hip abduction    Hip adduction    Hip internal rotation    Hip external rotation    Knee flexion    Knee extension    Ankle dorsiflexion    Ankle plantarflexion    Ankle inversion    Ankle eversion     (Blank rows = not tested)  LOWER EXTREMITY MMT:  MMT Right eval Left eval  Hip flexion    Hip extension    Hip abduction    Hip adduction    Hip internal rotation    Hip external rotation    Knee flexion    Knee extension    Ankle dorsiflexion    Ankle plantarflexion    Ankle inversion    Ankle eversion     (Blank rows = not tested)  LOWER EXTREMITY SPECIAL TESTS:  {LEspecialtests:26242}  FUNCTIONAL TESTS:  {Functional tests:24029}  GAIT: Distance walked: *** Assistive device utilized: {Assistive devices:23999} Level of assistance: {Levels  of assistance:24026} Comments: ***   TODAY'S TREATMENT:           [facility] Adult PT Treatment:                                                DATE: 02/24/2022 Therapeutic Exercise: ***  PATIENT EDUCATION:  Education details: Exam findings, POC, HEP Person educated: Patient Education method: Explanation, Demonstration, Tactile cues, Verbal cues, and Handouts Education comprehension: verbalized understanding, returned demonstration,  verbal cues required, tactile cues required, and needs further education  HOME EXERCISE PROGRAM: ***   ASSESSMENT: CLINICAL IMPRESSION: Patient is a 52 y.o. female who was seen today for physical therapy evaluation and treatment for chronic left hip pain. ***   OBJECTIVE IMPAIRMENTS: {opptimpairments:25111}.   ACTIVITY LIMITATIONS: {activitylimitations:27494}  PARTICIPATION LIMITATIONS: {participationrestrictions:25113}  PERSONAL FACTORS: {Personal factors:25162} are also affecting patient's functional outcome.   REHAB POTENTIAL: {rehabpotential:25112}  CLINICAL DECISION MAKING: {clinical decision making:25114}  EVALUATION COMPLEXITY: {Evaluation complexity:25115}   GOALS: Goals reviewed with patient? Yes  SHORT TERM GOALS: Target date: ***  Patient will be I with initial HEP in order to progress with therapy. Baseline: HEP provided at eval Goal status: INITIAL  2.  PT will review FOTO with patient by 3rd visit in order to understand expected progress and outcome with therapy. Baseline:  Goal status: INITIAL  3.  *** Baseline:  Goal status: INITIAL  LONG TERM GOALS: Target date: ***  Patient will be I with final HEP to maintain progress from PT. Baseline: HEP provided at eval Goal status: INITIAL  2.  Patient will report >/= ***% status on FOTO to indicate improved functional ability. Baseline: % functional status Goal status: INITIAL  3.  *** Baseline:  Goal status: INITIAL  4.  *** Baseline:  Goal status: INITIAL   PLAN: PT FREQUENCY: {rehab frequency:25116}  PT DURATION: {rehab duration:25117}  PLANNED INTERVENTIONS: {rehab planned interventions:25118::"Therapeutic exercises","Therapeutic activity","Neuromuscular re-education","Balance training","Gait training","Patient/Family education","Self Care","Joint mobilization"}  PLAN FOR NEXT SESSION: Review HEP and progress PRN, ***   Hilda Blades, PT, DPT, LAT, ATC 02/23/22  3:26 PM Phone:  [phone] Fax: [phone]

## 2022-02-24 ENCOUNTER — Encounter: Payer: Self-pay | Admitting: Physical Therapy

## 2022-02-24 ENCOUNTER — Ambulatory Visit: Payer: 59 | Attending: Family Medicine | Admitting: Physical Therapy

## 2022-02-24 ENCOUNTER — Other Ambulatory Visit: Payer: Self-pay

## 2022-02-24 DIAGNOSIS — M6281 Muscle weakness (generalized): Secondary | ICD-10-CM | POA: Insufficient documentation

## 2022-02-24 DIAGNOSIS — M1612 Unilateral primary osteoarthritis, left hip: Secondary | ICD-10-CM | POA: Diagnosis not present

## 2022-02-24 DIAGNOSIS — M25552 Pain in left hip: Secondary | ICD-10-CM | POA: Diagnosis not present

## 2022-02-24 NOTE — Patient Instructions (Signed)
Access Code: [email or web address] URL: [email or web address] Date: 02/24/2022 Prepared by: Hilda Blades  Exercises - Active Straight Leg Raise with Quad Set  - 1 x daily - 2 sets - 10 reps - Clamshell with Resistance  - 1 x daily - 2 sets - 10 reps - Bridge  - 1 x daily - 2 sets - 10 reps - Squat with Chair Touch  - 1 x daily - 2 sets - 10 reps

## 2022-03-04 ENCOUNTER — Ambulatory Visit: Payer: 59 | Attending: Family Medicine | Admitting: Physical Therapy

## 2022-03-04 ENCOUNTER — Encounter: Payer: Self-pay | Admitting: Physical Therapy

## 2022-03-04 DIAGNOSIS — M6281 Muscle weakness (generalized): Secondary | ICD-10-CM

## 2022-03-04 DIAGNOSIS — M25552 Pain in left hip: Secondary | ICD-10-CM | POA: Diagnosis not present

## 2022-03-04 NOTE — Therapy (Signed)
OUTPATIENT PHYSICAL THERAPY TREATMENT NOTE   Patient Name: Renee Collier MRN: [id number] DOB:[birth year], 52 y.o., female Today's Date: 03/04/2022  PCP: Charlott Rakes, MD   REFERRING PROVIDER: Charlott Rakes, MD  END OF SESSION:   PT End of Session - 03/04/22 0849     Visit Number 2    Number of Visits 9    Date for PT Re-Evaluation 04/21/22    Authorization Type [employer]    PT Start Time 0848    PT Stop Time 0928    PT Time Calculation (min) 40 min             Past Medical History:  Diagnosis Date   Arthritis    hips   Hypertension    Past Surgical History:  Procedure Laterality Date   DILATION AND CURETTAGE OF UTERUS N/A 04/27/2017   Procedure: Ultrasound Guided Dilatation and Curettage;  Surgeon: [name], MD;  Location: [facility];  Service: Gynecology;  Laterality: N/A;   ENDOMETRIAL ABLATION     HYSTEROSCOPY N/A 03/12/2014   Procedure: HYSTEROSCOPY WITH HYDROTHERMAL ABLATION;  Surgeon: Osborne Oman, MD;  Location: [facility];  Service: Gynecology;  Laterality: N/A;   TUBAL LIGATION     VAGINAL HYSTERECTOMY Left 06/10/2020   Procedure: HYSTERECTOMY VAGINAL with LEFT Salpingectomy;  Surgeon: Woodroe Mode, MD;  Location: [facility];  Service: Gynecology;  Laterality: Left;   Patient Active Problem List   Diagnosis Date Noted   Right carpal tunnel syndrome 10/30/2021   Left carpal tunnel syndrome 10/30/2021   Prediabetes 03/31/2021   Menometrorrhagia 06/10/2020   Primary osteoarthritis of left hip 10/18/2019   Vaginal discharge 12/14/2018   Status post HTA endometrial ablation 04/10/2014   Abnormal uterine bleeding (AUB) 12/18/2013    REFERRING DIAG: Primary osteoarthritis of left hip  THERAPY DIAG:  Pain in left hip  Muscle weakness (generalized)  Rationale for Evaluation and Treatment Rehabilitation  PERTINENT HISTORY: None  PRECAUTIONS: None  SUBJECTIVE:                                                                                                                                                                                       SUBJECTIVE STATEMENT:  I did the exercises some. I am getting use to them.    PAIN:  Are you having pain? Yes:  NPRS scale: 0/10 (10/10 pain at worst) Pain location: Left hip Pain description: Throbbing, stiff Aggravating factors: Standing, walking, sitting extended periods, stairs, lying on the left side, squat Relieving factors: Moving/walking to get rid of stiffness, medication, injections   OBJECTIVE: (objective measures completed at initial evaluation unless otherwise dated)   DIAGNOSTIC FINDINGS:  Imaging: XR HIP UNILAT W OR W/O PELVIS 2-3 VIEWS LEFT Result Date: 10/01/2021 Moderate degenerative changes to the left hip joint    PATIENT SURVEYS:  FOTO 31 % functional status   COGNITION: Overall cognitive status: Within functional limits for tasks assessed                         SENSATION: WFL   MUSCLE LENGTH: Hamstring and quad flexibility deficits   POSTURE:             Increased lumbar lordosis   PALPATION: Non-tender to palpation   LOWER EXTREMITY ROM: Hip PROM grossly limited bilaterally, left anterior hip pain at end range flexion   LOWER EXTREMITY MMT:   MMT Right eval Left eval  Hip flexion 4 4  Hip extension 4- 3  Hip abduction 4- 3  Hip adduction      Hip internal rotation 4 4  Hip external rotation 4 4  Knee flexion 5 4  Knee extension 5 4  Ankle dorsiflexion      Ankle plantarflexion      Ankle inversion      Ankle eversion       (Blank rows = not tested)   LOWER EXTREMITY SPECIAL TESTS:  Hip special tests: Anterior hip impingement test: positive    FUNCTIONAL TESTS:  Squat: decreased depth with left anterior hip pain   GAIT: Assistive device utilized: None Level of assistance: Complete Independence Comments: Slightly antalgic on left     TODAY'S TREATMENT:           [facility] Adult PT Treatment:                                                 DATE: 03/04/22 Therapeutic Exercise: Nustep L5 x 5 min UE/LE  Squat to chair tap 10 x 2  SLR 2 x 10 each Bridge 1 x 10 Bridge with ball squeeze x 10 Clam green band 10 x 2 each LTR SKTC Supine left figure 4 vs butterfly Supine hip flexor stretch    [facility] Adult PT Treatment:                                                DATE: 02/24/2022 Therapeutic Exercise: SLR x 10 Side clamshell with green x 10 Bridge x 10 Squat to table touch x 10   PATIENT EDUCATION:  Education details: Exam findings, POC, HEP Person educated: Patient Education method: Explanation, Demonstration, Tactile cues, Verbal cues, and Handouts Education comprehension: verbalized understanding, returned demonstration, verbal cues required, tactile cues required, and needs further education   HOME EXERCISE PROGRAM: Access Code: [email or web address] URL: [email or web address] Date: 03/04/2022 Prepared by: Hessie Diener  Exercises - Active Straight Leg Raise with Quad Set  - 1 x daily - 2 sets - 10 reps - Clamshell with Resistance  - 1 x daily - 2 sets - 10 reps - Bridge  - 1 x daily - 2 sets - 10 reps - Squat with Chair Touch  - 1 x daily - 2 sets - 10 reps Added 03/04/22:  - Supine Lower Trunk Rotation  - 1 x daily - 7 x weekly - 1 sets - 3 reps -  20 hold - Supine hip flexor stretch  - 1 x daily - 7 x weekly - 1 sets - 3 reps - 20 hold - Supine Hip Adductor Stretch  - 1 x daily - 7 x weekly - 1 sets - 3 reps - 20 hold      ASSESSMENT: CLINICAL IMPRESSION: Patient is a 52 y.o. female who was seen today for physical therapy treatment for chronic left hip pain. She has been compliant with HEP. Reviewed HEP and progressed with hip mobility. She reported a positive response to hip stretches and her HEP was updated. She will benefit from continued skilled PT to address strength deficits to improved active tolerance.       OBJECTIVE IMPAIRMENTS: Abnormal gait, decreased activity tolerance,  decreased ROM, decreased strength, impaired flexibility, postural dysfunction, and pain.    ACTIVITY LIMITATIONS: lifting, sitting, standing, squatting, sleeping, stairs, dressing, and locomotion level   PARTICIPATION LIMITATIONS: meal prep, cleaning, laundry, shopping, community activity, and occupation   PERSONAL FACTORS: Fitness, Past/current experiences, and Time since onset of injury/illness/exacerbation are also affecting patient's functional outcome.    REHAB POTENTIAL: Good   CLINICAL DECISION MAKING: Stable/uncomplicated   EVALUATION COMPLEXITY: Low     GOALS: Goals reviewed with patient? Yes   SHORT TERM GOALS: Target date: 03/24/2022   Patient will be I with initial HEP in order to progress with therapy. Baseline: HEP provided at eval Goal status: INITIAL   2.  PT will review FOTO with patient by 3rd visit in order to understand expected progress and outcome with therapy. Baseline: FOTO assessed at eval Goal status: INITIAL   3.  Patient will report left hip pain with work related tasks </= 6/10 pain in order to reduce functional limitations Baseline: 10/10 pain Goal status: INITIAL   LONG TERM GOALS: Target date: 04/21/2022   Patient will be I with final HEP to maintain progress from PT. Baseline: HEP provided at eval Goal status: INITIAL   2.  Patient will report >/= 61% status on FOTO to indicate improved functional ability. Baseline: 31% functional status Goal status: INITIAL   3.  Patient will demonstrate left hip strength grossly >/= 4/5 MMT in order to improve standing and walking tolerance Baseline: left hip strength grossly 3/5 MMT Goal status: INITIAL   4.  Patient will report left hip pain with work related tasks </= 3/10 pain in order to reduce functional limitations Baseline: 10/10 pain  Goal status: INITIAL     PLAN: PT FREQUENCY: 1x/week   PT DURATION: 8 weeks   PLANNED INTERVENTIONS: Therapeutic exercises, Therapeutic activity,  Neuromuscular re-education, Balance training, Gait training, Patient/Family education, Self Care, Joint mobilization, Joint manipulation, Aquatic Therapy, Dry Needling, Manual therapy, and Re-evaluation   PLAN FOR NEXT SESSION: Review HEP and progress PRN, manual/mobs for left hip, progress left hip strengthening with more closed chain as tolerated   Hessie Diener, PTA 03/04/22 10:01 AM Phone: [phone] Fax: [phone]

## 2022-03-12 ENCOUNTER — Ambulatory Visit: Payer: 59 | Admitting: Physical Therapy

## 2022-03-17 ENCOUNTER — Ambulatory Visit: Payer: 59 | Admitting: Physical Therapy

## 2022-03-17 ENCOUNTER — Telehealth: Payer: Self-pay | Admitting: Physical Therapy

## 2022-03-17 ENCOUNTER — Other Ambulatory Visit: Payer: Self-pay

## 2022-03-17 ENCOUNTER — Encounter: Payer: Self-pay | Admitting: Physical Therapy

## 2022-03-17 DIAGNOSIS — M6281 Muscle weakness (generalized): Secondary | ICD-10-CM

## 2022-03-17 DIAGNOSIS — M25552 Pain in left hip: Secondary | ICD-10-CM

## 2022-03-17 NOTE — Therapy (Signed)
OUTPATIENT PHYSICAL THERAPY TREATMENT NOTE   Patient Name: Renee Collier MRN: [id number] DOB:01-Jun-1970, 52 y.o., female Today's Date: 03/17/2022  PCP: Charlott Rakes, MD   REFERRING PROVIDER: Charlott Rakes, MD   END OF SESSION:   PT End of Session - 03/17/22 1447     Visit Number 3    Number of Visits 9    Date for PT Re-Evaluation 04/21/22    Authorization Type Aetna    PT Start Time 1445    PT Stop Time 1525    PT Time Calculation (min) 40 min    Activity Tolerance Patient tolerated treatment well    Behavior During Therapy WFL for tasks assessed/performed              Past Medical History:  Diagnosis Date   Arthritis    hips   Hypertension    Past Surgical History:  Procedure Laterality Date   DILATION AND CURETTAGE OF UTERUS N/A 04/27/2017   Procedure: Ultrasound Guided Dilatation and Curettage;  Surgeon: [name], MD;  Location: [facility];  Service: Gynecology;  Laterality: N/A;   ENDOMETRIAL ABLATION     HYSTEROSCOPY N/A 03/12/2014   Procedure: HYSTEROSCOPY WITH HYDROTHERMAL ABLATION;  Surgeon: Osborne Oman, MD;  Location: [facility];  Service: Gynecology;  Laterality: N/A;   TUBAL LIGATION     VAGINAL HYSTERECTOMY Left 06/10/2020   Procedure: HYSTERECTOMY VAGINAL with LEFT Salpingectomy;  Surgeon: Woodroe Mode, MD;  Location: [facility];  Service: Gynecology;  Laterality: Left;   Patient Active Problem List   Diagnosis Date Noted   Right carpal tunnel syndrome 10/30/2021   Left carpal tunnel syndrome 10/30/2021   Prediabetes 03/31/2021   Menometrorrhagia 06/10/2020   Primary osteoarthritis of left hip 10/18/2019   Vaginal discharge 12/14/2018   Status post HTA endometrial ablation 04/10/2014   Abnormal uterine bleeding (AUB) 12/18/2013    REFERRING DIAG: Primary osteoarthritis of left hip  THERAPY DIAG:  Pain in left hip  Muscle weakness (generalized)  Rationale for Evaluation and Treatment Rehabilitation  PERTINENT  HISTORY: None  PRECAUTIONS: None   SUBJECTIVE:                                                                                                                                                                                     SUBJECTIVE STATEMENT:  Patient reports she is doing well. Denies any current pain. She did just get off from work.  PAIN:  Are you having pain? Yes:  NPRS scale: 0/10 (10/10 pain at worst) Pain location: Left hip Pain description: Throbbing, stiff Aggravating factors: Standing, walking, sitting extended periods, stairs, lying on the left side, squat Relieving  factors: Moving/walking to get rid of stiffness, medication, injections   OBJECTIVE: (objective measures completed at initial evaluation unless otherwise dated) DIAGNOSTIC FINDINGS:             Imaging: XR HIP UNILAT W OR W/O PELVIS 2-3 VIEWS LEFT Result Date: 10/01/2021 Moderate degenerative changes to the left hip joint    PATIENT SURVEYS:  FOTO 31 % functional status    MUSCLE LENGTH: Hamstring and quad flexibility deficits   POSTURE:             Increased lumbar lordosis   PALPATION: Non-tender to palpation   LOWER EXTREMITY ROM: Hip PROM grossly limited bilaterally, left anterior hip pain at end range flexion   LOWER EXTREMITY MMT:   MMT Right eval Left eval Left 03/17/2022  Hip flexion 4 4   Hip extension 4- 3   Hip abduction 4- 3 3+  Hip adduction       Hip internal rotation 4 4   Hip external rotation 4 4   Knee flexion 5 4   Knee extension 5 4   Ankle dorsiflexion       Ankle plantarflexion       Ankle inversion       Ankle eversion        (Blank rows = not tested)   LOWER EXTREMITY SPECIAL TESTS:  Hip special tests: Anterior hip impingement test: positive    FUNCTIONAL TESTS:  Squat: decreased depth with left anterior hip pain   GAIT: Assistive device utilized: None Level of assistance: Complete Independence Comments: Slightly antalgic on left     TODAY'S  TREATMENT:           [facility] Adult PT Treatment:                                                DATE: 03/17/22 Therapeutic Exercise: Nustep L6 x 5 min with UE/LE while taking subjective LTR x 10 Supine piriformis stretch 2 x 20 sec each Modified thomas stretch 2 x 30 sec each Bridge 2 x 10 SLR 2 x 10 each Supine unilateral clamshell with blue 2 x 10 each Sidelying hip abduction 2 x 10 each Squat to chair tap 2 x 10 LAQ with 5# 2 x 10 each Seated hamstring curl with blue 2 x 10 each Seated hamstring stretch 2 x 30 sec each   [facility] Adult PT Treatment:                                                DATE: 03/04/22 Therapeutic Exercise: Nustep L5 x 5 min UE/LE  Squat to chair tap 10 x 2  SLR 2 x 10 each Bridge 1 x 10 Bridge with ball squeeze x 10 Clam green band 10 x 2 each LTR SKTC Supine left figure 4 vs butterfly Supine hip flexor stretch  [facility] Adult PT Treatment:                                                DATE: 02/24/2022 Therapeutic Exercise: SLR x 10 Side clamshell with green x 10 Bridge  x 10 Squat to table touch x 10   PATIENT EDUCATION:  Education details: HEP Person educated: Patient Education method: Explanation, Demonstration, Tactile cues, Verbal cues Education comprehension: verbalized understanding, returned demonstration, verbal cues required, tactile cues required, and needs further education   HOME EXERCISE PROGRAM: Access Code: [email or web address]      ASSESSMENT: CLINICAL IMPRESSION: Patient tolerated therapy well with no adverse effects. Therapy focused primarily on progressing hip and LE strength with good tolerance. She does exhibit a slight improvement in her hip abductor strength. She did report greater difficulty with strengthening exercises on the left side and report some left hip pain with left sidelying. No changes to HEP this visit. Patient would benefit from continued skilled PT to progress her mobility and strength in order to reduce pain and maximize  functional ability.    OBJECTIVE IMPAIRMENTS: Abnormal gait, decreased activity tolerance, decreased ROM, decreased strength, impaired flexibility, postural dysfunction, and pain.    ACTIVITY LIMITATIONS: lifting, sitting, standing, squatting, sleeping, stairs, dressing, and locomotion level   PARTICIPATION LIMITATIONS: meal prep, cleaning, laundry, shopping, community activity, and occupation   PERSONAL FACTORS: Fitness, Past/current experiences, and Time since onset of injury/illness/exacerbation are also affecting patient's functional outcome.      GOALS: Goals reviewed with patient? Yes   SHORT TERM GOALS: Target date: 03/24/2022   Patient will be I with initial HEP in order to progress with therapy. Baseline: HEP provided at eval Goal status: INITIAL   2.  PT will review FOTO with patient by 3rd visit in order to understand expected progress and outcome with therapy. Baseline: FOTO assessed at eval Goal status: INITIAL   3.  Patient will report left hip pain with work related tasks </= 6/10 pain in order to reduce functional limitations Baseline: 10/10 pain Goal status: INITIAL   LONG TERM GOALS: Target date: 04/21/2022   Patient will be I with final HEP to maintain progress from PT. Baseline: HEP provided at eval Goal status: INITIAL   2.  Patient will report >/= 61% status on FOTO to indicate improved functional ability. Baseline: 31% functional status Goal status: INITIAL   3.  Patient will demonstrate left hip strength grossly >/= 4/5 MMT in order to improve standing and walking tolerance Baseline: left hip strength grossly 3/5 MMT Goal status: INITIAL   4.  Patient will report left hip pain with work related tasks </= 3/10 pain in order to reduce functional limitations Baseline: 10/10 pain  Goal status: INITIAL     PLAN: PT FREQUENCY: 1x/week   PT DURATION: 8 weeks   PLANNED INTERVENTIONS: Therapeutic exercises, Therapeutic activity, Neuromuscular  re-education, Balance training, Gait training, Patient/Family education, Self Care, Joint mobilization, Joint manipulation, Aquatic Therapy, Dry Needling, Manual therapy, and Re-evaluation   PLAN FOR NEXT SESSION: Review HEP and progress PRN, manual/mobs for left hip, progress left hip strengthening with more closed chain as tolerated   Hilda Blades, PT, DPT, LAT, ATC 03/17/22  3:37 PM Phone: [phone] Fax: [phone]

## 2022-03-17 NOTE — Telephone Encounter (Signed)
Contacted patient regarding no show to appointment at 9:30. She actually is double scheduled for today and also has appointment at 2:45 pm. Left voicemail with this information and requested call back if she needs to cancel or reschedule.

## 2022-03-24 NOTE — Therapy (Addendum)
OUTPATIENT PHYSICAL THERAPY TREATMENT NOTE  DISCHARGE   Patient Name: Renee Collier MRN: [id number] DOB:08/17/1970, 52 y.o., female Today's Date: 03/25/2022  PCP: Hoy Register, MD REFERRING PROVIDER: Hoy Register, MD   END OF SESSION:   PT End of Session - 03/25/22 0840     Visit Number 4    Number of Visits 9    Date for PT Re-Evaluation 04/21/22    Authorization Type Aetna    PT Start Time [phone]    PT Stop Time 0915    PT Time Calculation (min) 40 min    Activity Tolerance Patient tolerated treatment well    Behavior During Therapy [facility] for tasks assessed/performed               Past Medical History:  Diagnosis Date   Arthritis    hips   Hypertension    Past Surgical History:  Procedure Laterality Date   DILATION AND CURETTAGE OF UTERUS N/A 04/27/2017   Procedure: Ultrasound Guided Dilatation and Curettage;  Surgeon: Allie Bossier, MD;  Location: [facility];  Service: Gynecology;  Laterality: N/A;   ENDOMETRIAL ABLATION     HYSTEROSCOPY N/A 03/12/2014   Procedure: HYSTEROSCOPY WITH HYDROTHERMAL ABLATION;  Surgeon: Tereso Newcomer, MD;  Location: [facility];  Service: Gynecology;  Laterality: N/A;   TUBAL LIGATION     VAGINAL HYSTERECTOMY Left 06/10/2020   Procedure: HYSTERECTOMY VAGINAL with LEFT Salpingectomy;  Surgeon: Adam Phenix, MD;  [facility];  Service: Gynecology;  Laterality: Left;   Patient Active Problem List   Diagnosis Date Noted   Right carpal tunnel syndrome 10/30/2021   Left carpal tunnel syndrome 10/30/2021   Prediabetes 03/31/2021   Menometrorrhagia 06/10/2020   Primary osteoarthritis of left hip 10/18/2019   Vaginal discharge 12/14/2018   Status post HTA endometrial ablation 04/10/2014   Abnormal uterine bleeding (AUB) 12/18/2013    REFERRING DIAG: Primary osteoarthritis of left hip  THERAPY DIAG:  Pain in left hip  Muscle weakness (generalized)  Rationale for Evaluation and Treatment  Rehabilitation  PERTINENT HISTORY: None  PRECAUTIONS: None   SUBJECTIVE:                                                                                                                                                                                     SUBJECTIVE STATEMENT:  Patient reports her hip is feeling alright. She states that when she sits and then goes to get up she gets stiffness in the hip. When she is moving she is fine, but once she sits it will hurt.  PAIN:  Are you having pain? Yes:  NPRS scale: 0/10 (7/10 pain at worst)  Pain location: Left hip Pain description: Throbbing, stiff Aggravating factors: Standing, walking, sitting extended periods, stairs, lying on the left side, squat Relieving factors: Moving/walking to get rid of stiffness, medication, injections   OBJECTIVE: (objective measures completed at initial evaluation unless otherwise dated) DIAGNOSTIC FINDINGS:             Imaging: XR HIP UNILAT W OR W/O PELVIS 2-3 VIEWS LEFT Result Date: 10/01/2021 Moderate degenerative changes to the left hip joint    PATIENT SURVEYS:  FOTO 31 % functional status    MUSCLE LENGTH: Hamstring and quad flexibility deficits   POSTURE:             Increased lumbar lordosis   PALPATION: Non-tender to palpation   LOWER EXTREMITY ROM: Hip PROM grossly limited bilaterally, left anterior hip pain at end range flexion   LOWER EXTREMITY MMT:   MMT Right eval Left eval Left 03/17/2022 Left 03/25/2022  Hip flexion 4 4    Hip extension 4- 3  3+  Hip abduction 4- 3 3+ 3+  Hip adduction        Hip internal rotation 4 4    Hip external rotation 4 4    Knee flexion 5 4    Knee extension 5 4    Ankle dorsiflexion        Ankle plantarflexion        Ankle inversion        Ankle eversion         (Blank rows = not tested)   LOWER EXTREMITY SPECIAL TESTS:  Hip special tests: Anterior hip impingement test: positive    FUNCTIONAL TESTS:  Squat: decreased depth with left  anterior hip pain   GAIT: Assistive device utilized: None Level of assistance: Complete Independence Comments: Slightly antalgic on left     TODAY'S TREATMENT:           [facility] Adult PT Treatment:                                                DATE: 03/24/22 Therapeutic Exercise: Nustep L6 x 5 min with UE/LE while taking subjective Seated hamstring stretch 2 x 30 sec each LTR x 10 Supine piriformis stretch push / pull 2 x 30 sec each Windshield wiper x 10 Bridge x 10 Figure-4 bridge 2 x 10 Sidelying hip abduction 2 x 10 each Squat to chair tap holding 10# at chest 3 x 10 Forward 6" step-up 2 x 10 each   [facility] Adult PT Treatment:                                                DATE: 03/17/22 Therapeutic Exercise: Nustep L6 x 5 min with UE/LE while taking subjective LTR x 10 Supine piriformis stretch 2 x 20 sec each Modified thomas stretch 2 x 30 sec each Bridge 2 x 10 SLR 2 x 10 each Supine unilateral clamshell with blue 2 x 10 each Sidelying hip abduction 2 x 10 each Squat to chair tap 2 x 10 LAQ with 5# 2 x 10 each Seated hamstring curl with blue 2 x 10 each Seated hamstring stretch 2 x 30 sec each  [facility] Adult PT Treatment:  DATE: 03/04/22 Therapeutic Exercise: Nustep L5 x 5 min UE/LE  Squat to chair tap 10 x 2  SLR 2 x 10 each Bridge 1 x 10 Bridge with ball squeeze x 10 Clam green band 10 x 2 each LTR SKTC Supine left figure 4 vs butterfly Supine hip flexor stretch   PATIENT EDUCATION:  Education details: HEP Person educated: Patient Education method: Programmer, multimedia, Facilities manager, Actor cues, Verbal cues Education comprehension: verbalized understanding, returned demonstration, verbal cues required, tactile cues required, and needs further education   HOME EXERCISE PROGRAM: Access Code: [email or web address]      ASSESSMENT: CLINICAL IMPRESSION: Patient tolerated therapy well with no adverse effects. Therapy focused on  progressing hip mobility and strengthening with good tolerance. She demonstrates improvement in left hip strength but continued limitation compared to the right side. She does not report pain with therapy but does state that she has pain when she sits toward the end of the day. Updated her HEP to progress her stretching and strengthening for home with good tolerance. Patient would benefit from continued skilled PT to progress her mobility and strength in order to reduce pain and maximize functional ability.    OBJECTIVE IMPAIRMENTS: Abnormal gait, decreased activity tolerance, decreased ROM, decreased strength, impaired flexibility, postural dysfunction, and pain.    ACTIVITY LIMITATIONS: lifting, sitting, standing, squatting, sleeping, stairs, dressing, and locomotion level   PARTICIPATION LIMITATIONS: meal prep, cleaning, laundry, shopping, community activity, and occupation   PERSONAL FACTORS: Fitness, Past/current experiences, and Time since onset of injury/illness/exacerbation are also affecting patient's functional outcome.      GOALS: Goals reviewed with patient? Yes   SHORT TERM GOALS: Target date: 03/24/2022   Patient will be I with initial HEP in order to progress with therapy. Baseline: HEP provided at eval 03/25/2022: independent Goal status: MET   2.  PT will review FOTO with patient by 3rd visit in order to understand expected progress and outcome with therapy. Baseline: FOTO assessed at eval 03/25/2022: reviewed Goal status: MET   3.  Patient will report left hip pain with work related tasks </= 6/10 pain in order to reduce functional limitations Baseline: 10/10 pain 03/25/2022: 7/10 Goal status: ONGOING   LONG TERM GOALS: Target date: 04/21/2022   Patient will be I with final HEP to maintain progress from PT. Baseline: HEP provided at eval Goal status: INITIAL   2.  Patient will report >/= 61% status on FOTO to indicate improved functional ability. Baseline: 31%  functional status Goal status: INITIAL   3.  Patient will demonstrate left hip strength grossly >/= 4/5 MMT in order to improve standing and walking tolerance Baseline: left hip strength grossly 3/5 MMT Goal status: INITIAL   4.  Patient will report left hip pain with work related tasks </= 3/10 pain in order to reduce functional limitations Baseline: 10/10 pain  Goal status: INITIAL     PLAN: PT FREQUENCY: 1x/week   PT DURATION: 8 weeks   PLANNED INTERVENTIONS: Therapeutic exercises, Therapeutic activity, Neuromuscular re-education, Balance training, Gait training, Patient/Family education, Self Care, Joint mobilization, Joint manipulation, Aquatic Therapy, Dry Needling, Manual therapy, and Re-evaluation   PLAN FOR NEXT SESSION: Review HEP and progress PRN, manual/mobs for left hip, progress left hip strengthening with more closed chain as tolerated   Rosana Hoes, PT, DPT, LAT, ATC 03/25/22  9:23 AM Phone: [phone] Fax: [phone]     PHYSICAL THERAPY DISCHARGE SUMMARY  Visits from Start of Care: 4  Current functional level related to goals / functional outcomes:  See above   Remaining deficits: See above   Education / Equipment: HEP   Patient agrees to discharge. Patient goals were partially met. Patient is being discharged due to not returning since the last visit.  Rosana Hoes, PT, DPT, LAT, ATC 05/17/22  1:10 PM Phone: [phone] Fax: [phone]

## 2022-03-25 ENCOUNTER — Ambulatory Visit: Payer: 59 | Admitting: Physical Therapy

## 2022-03-25 ENCOUNTER — Encounter: Payer: Self-pay | Admitting: Physical Therapy

## 2022-03-25 ENCOUNTER — Other Ambulatory Visit: Payer: Self-pay

## 2022-03-25 DIAGNOSIS — M6281 Muscle weakness (generalized): Secondary | ICD-10-CM

## 2022-03-25 DIAGNOSIS — M25552 Pain in left hip: Secondary | ICD-10-CM | POA: Diagnosis not present

## 2022-03-25 NOTE — Patient Instructions (Signed)
Access Code: [email or web address] URL: [email or web address] Date: 03/25/2022 Prepared by: Hilda Blades  Exercises - Active Straight Leg Raise with Quad Set  - 1 x daily - 2 sets - 10 reps - Figure 4 Bridge  - 1 x daily - 2 sets - 10 reps - Sidelying Hip Abduction  - 1 x daily - 2 sets - 10 reps - Squat with Chair Touch  - 1 x daily - 2 sets - 10 reps - Supine Lower Trunk Rotation  - 1 x daily - 10 reps - 5 hold - Supine Piriformis Stretch with Foot on Ground  - 1 x daily - 2 reps - 30 hold - Supine Figure 4 Piriformis Stretch  - 1 x daily - 2 reps - 30 hold - Supine Hip Internal and External Rotation  - 1 x daily - 10 reps - 5 hold - Modified Thomas Stretch  - 1 x daily - 2 reps - 60 hold

## 2022-04-05 ENCOUNTER — Telehealth: Payer: 59 | Admitting: Physician Assistant

## 2022-04-05 DIAGNOSIS — H01004 Unspecified blepharitis left upper eyelid: Secondary | ICD-10-CM

## 2022-04-05 MED ORDER — AMOXICILLIN-POT CLAVULANATE 875-125 MG PO TABS: 1.0000 | ORAL_TABLET | Freq: Two times a day (BID) | ORAL | 0 refills | Status: DC

## 2022-04-05 NOTE — Patient Instructions (Signed)
Renee Collier, thank you for joining Mar Daring, PA-C for today's virtual visit.  While this provider is not your primary care provider (PCP), if your PCP is located in our provider database this encounter information will be shared with them immediately following your visit.   [facility] account gives you access to today's visit and all your visits, tests, and labs performed at [facility] " click here if you don't have a [place] account or go to [email or web address]  Consent: (Patient) Renee Collier provided verbal consent for this virtual visit at the beginning of the encounter.  Current Medications:  Current Outpatient Medications:    amoxicillin-clavulanate (AUGMENTIN) 875-125 MG tablet, Take 1 tablet by mouth 2 (two) times daily., Disp: 14 tablet, Rfl: 0   cyclobenzaprine (FLEXERIL) 10 MG tablet, Take 1 tablet (10 mg total) by mouth 2 (two) times daily as needed for muscle spasms., Disp: 60 tablet, Rfl: 2   fluticasone (FLONASE) 50 MCG/ACT nasal spray, Place 2 sprays into both nostrils daily., Disp: 16 g, Rfl: 1   HYDROcodone-acetaminophen (NORCO) 5-325 MG tablet, Take 1 tablet by mouth 3 (three) times daily as needed., Disp: 15 tablet, Rfl: 0   lisinopril-hydrochlorothiazide (ZESTORETIC) 10-12.5 MG tablet, Take 1 tablet by mouth daily., Disp: 90 tablet, Rfl: 1   meloxicam (MOBIC) 7.5 MG tablet, Take 1 tablet (7.5 mg total) by mouth daily., Disp: 30 tablet, Rfl: 2   ondansetron (ZOFRAN) 4 MG tablet, Take 1 tablet (4 mg total) by mouth every 8 (eight) hours as needed for nausea or vomiting., Disp: 40 tablet, Rfl: 0   Medications ordered in this encounter:  Meds ordered this encounter  Medications   amoxicillin-clavulanate (AUGMENTIN) 875-125 MG tablet    Sig: Take 1 tablet by mouth 2 (two) times daily.    Dispense:  14 tablet    Refill:  0    Order Specific Question:   Supervising Provider    Answer:   Chase Picket [id number]      *If you need refills on other medications prior to your next appointment, please contact your pharmacy*  Follow-Up: Call back or seek an in-person evaluation if the symptoms worsen or if the condition fails to improve as anticipated.  [place] [phone]  Other Instructions  Blepharitis Blepharitis is swelling of the eyelids. It can cause the eyes to feel dry or gritty. Other symptoms may include: Reddish, scaly skin around the scalp and eyebrows. Eyelids that itch or burn. Fluid that leaks from the eye at night. This causes the eyelashes to stick together in the morning. Eyelashes that fall out. Redness of the eyes. Eyes that are sensitive to light. Follow these instructions at home: Watch for any changes in how your eyes look or feel. Tell your doctor about any changes. Follow these instructions to help with your condition. Keeping clean Wash your hands often with soap and water for at least 20 seconds. Clean your eyes. Wash the edges of your eyelids using eyelid wipes or a small amount of baby shampoo that has been mixed with warm water (diluted). Do this 2 or more times a day. Wash your face and eyebrows at least once a day. Use a clean towel each time you dry your eyelids. Do not use the towel to clean or dry other areas of your body. Do not share your towel with anyone. General instructions Avoid wearing makeup until you get better. Do not share makeup with anyone. Avoid rubbing your  eyes. Use a warm compress on your eyes for 5-10 minutes at a time. Do this 1 or 2 times a day, or as told by your doctor. You can use: A towel with warm water on it. A heating pad that can be warmed in the microwave. The pad should be very warm but not hot enough to burn the skin. If you were given an antibiotic cream or eye drops, use the medicine as told by your doctor. Do not stop using the medicine even if you feel better. Keep all follow-up visits. Contact a doctor  if: Your eyelids feel hot. You have blisters on your eyelids. You have a rash on your eyelids. The swelling does not go away in 2-4 days. The swelling gets worse. Get help right away if: You have pain that gets worse or spreads to other parts of your face. You have redness that gets worse or spreads to other parts of your face. You have changes in how you see (vision). You have pain when you look at lights or things that move. You have a fever. Summary Blepharitis is swelling of the eyelids. Watch for any changes in how your eyes look or feel. Tell your doctor about any changes. Follow home care instructions as told by your doctor. Wash your hands often with soap and water for at least 20 seconds. Avoid wearing makeup. Do not rub your eyes. Use a warm compress, creams, or eye drops as told by your doctor. Let your doctor know if you have changes in how you see, blisters or a rash on your eyelids, or other problems. This information is not intended to replace advice given to you by your health care provider. Make sure you discuss any questions you have with your health care provider. Document Revised: 02/20/2020 Document Reviewed: 02/20/2020 Elsevier Patient Education  [place].    If you have been instructed to have an in-person evaluation today at a local Urgent Care facility, please use the link below. It will take you to a list of all of our available [place] [facility], including address, phone number and hours of operation. Please do not delay care.  [place] [facility]  If you or a family member do not have a primary care provider, use the link below to schedule a visit and establish care. When you choose a [facility] primary care physician or advanced practice provider, you gain a long-term partner in health. Find a Primary Care Provider  Learn more about [facility]'s in-office and virtual care options: [facility]

## 2022-04-05 NOTE — Progress Notes (Signed)
Virtual Visit Consent   Renee Collier, you are scheduled for a virtual visit with a [facility] provider today. Just as with appointments in the office, your consent must be obtained to participate. Your consent will be active for this visit and any virtual visit you may have with one of our providers in the next 365 days. If you have a MyChart account, a copy of this consent can be sent to you electronically.  As this is a virtual visit, video technology does not allow for your provider to perform a traditional examination. This may limit your provider's ability to fully assess your condition. If your provider identifies any concerns that need to be evaluated in person or the need to arrange testing (such as labs, EKG, etc.), we will make arrangements to do so. Although advances in technology are sophisticated, we cannot ensure that it will always work on either your end or our end. If the connection with a video visit is poor, the visit may have to be switched to a telephone visit. With either a video or telephone visit, we are not always able to ensure that we have a secure connection.  By engaging in this virtual visit, you consent to the provision of healthcare and authorize for your insurance to be billed (if applicable) for the services provided during this visit. Depending on your insurance coverage, you may receive a charge related to this service.  I need to obtain your verbal consent now. Are you willing to proceed with your visit today? Renee Collier has provided verbal consent on 04/05/2022 for a virtual visit (video or telephone). Mar Daring, PA-C  Date: 04/05/2022 9:42 AM  Virtual Visit via Video Note   I, Mar Daring, connected with  Renee Collier  ([id number], January 31, 1971) on 04/05/22 at  9:45 AM EST by a video-enabled telemedicine application and verified that I am speaking with the correct person using two identifiers.  Location: Patient: Virtual Visit Location  Patient: Home Provider: Virtual Visit Location Provider: Home Office   I discussed the limitations of evaluation and management by telemedicine and the availability of in person appointments. The patient expressed understanding and agreed to proceed.    History of Present Illness: Renee Collier is a 52 y.o. who identifies as a female who was assigned female at birth, and is being seen today for swelling around left eye. Started yesterday and has continued to progress to include the whole upper left eyelid. Also, having some light sensitivity with it and aching around the orbital socket. Denies fevers, chills, nausea, and vomiting.   Problems:  Patient Active Problem List   Diagnosis Date Noted   Right carpal tunnel syndrome 10/30/2021   Left carpal tunnel syndrome 10/30/2021   Prediabetes 03/31/2021   Menometrorrhagia 06/10/2020   Primary osteoarthritis of left hip 10/18/2019   Vaginal discharge 12/14/2018   Status post HTA endometrial ablation 04/10/2014   Abnormal uterine bleeding (AUB) 12/18/2013    Allergies: No Known Allergies Medications:  Current Outpatient Medications:    amoxicillin-clavulanate (AUGMENTIN) 875-125 MG tablet, Take 1 tablet by mouth 2 (two) times daily., Disp: 14 tablet, Rfl: 0   cyclobenzaprine (FLEXERIL) 10 MG tablet, Take 1 tablet (10 mg total) by mouth 2 (two) times daily as needed for muscle spasms., Disp: 60 tablet, Rfl: 2   fluticasone (FLONASE) 50 MCG/ACT nasal spray, Place 2 sprays into both nostrils daily., Disp: 16 g, Rfl: 1   HYDROcodone-acetaminophen (NORCO) 5-325 MG tablet, Take 1 tablet by mouth 3 (  three) times daily as needed., Disp: 15 tablet, Rfl: 0   lisinopril-hydrochlorothiazide (ZESTORETIC) 10-12.5 MG tablet, Take 1 tablet by mouth daily., Disp: 90 tablet, Rfl: 1   meloxicam (MOBIC) 7.5 MG tablet, Take 1 tablet (7.5 mg total) by mouth daily., Disp: 30 tablet, Rfl: 2   ondansetron (ZOFRAN) 4 MG tablet, Take 1 tablet (4 mg total) by mouth  every 8 (eight) hours as needed for nausea or vomiting., Disp: 40 tablet, Rfl: 0  Observations/Objective: Patient is well-developed, well-nourished in no acute distress.  Resting comfortably at home.  Head is normocephalic, atraumatic.  No labored breathing.  Speech is clear and coherent with logical content.  Patient is alert and oriented at baseline.  Left upper eyelid is swelling and slightly darker indicating some possible erythema  Assessment and Plan: 1. Blepharitis of left upper eyelid, unspecified type - amoxicillin-clavulanate (AUGMENTIN) 875-125 MG tablet; Take 1 tablet by mouth 2 (two) times daily.  Dispense: 14 tablet; Refill: 0  - Suspect blepharitis of the left upper eyelid - Augmentin prescribed - Warm compresses - Good hand hygiene - Tylenol and Ibuprofen if needed for pain - Baby shampoo and wash eyelashes gently daily - Seek in person evaluation if symptoms worsen or fail to improve   Follow Up Instructions: I discussed the assessment and treatment plan with the patient. The patient was provided an opportunity to ask questions and all were answered. The patient agreed with the plan and demonstrated an understanding of the instructions.  A copy of instructions were sent to the patient via MyChart unless otherwise noted below.    The patient was advised to call back or seek an in-person evaluation if the symptoms worsen or if the condition fails to improve as anticipated.  Time:  I spent 10 minutes with the patient via telehealth technology discussing the above problems/concerns.    Mar Daring, PA-C

## 2022-04-06 ENCOUNTER — Ambulatory Visit: Payer: 59 | Admitting: Physical Therapy

## 2022-04-07 ENCOUNTER — Ambulatory Visit: Payer: 59 | Attending: Family Medicine | Admitting: Family Medicine

## 2022-04-07 ENCOUNTER — Encounter: Payer: Self-pay | Admitting: Family Medicine

## 2022-04-07 VITALS — BP 145/95 | HR 97 | Temp 98.0°F | Ht 63.0 in | Wt 217.6 lb

## 2022-04-07 DIAGNOSIS — J0100 Acute maxillary sinusitis, unspecified: Secondary | ICD-10-CM | POA: Diagnosis not present

## 2022-04-07 DIAGNOSIS — I10 Essential (primary) hypertension: Secondary | ICD-10-CM | POA: Diagnosis not present

## 2022-04-07 DIAGNOSIS — N951 Menopausal and female climacteric states: Secondary | ICD-10-CM

## 2022-04-07 DIAGNOSIS — Z0001 Encounter for general adult medical examination with abnormal findings: Secondary | ICD-10-CM

## 2022-04-07 DIAGNOSIS — Z0000 Encounter for general adult medical examination without abnormal findings: Secondary | ICD-10-CM

## 2022-04-07 MED ORDER — LISINOPRIL-HYDROCHLOROTHIAZIDE 20-25 MG PO TABS: 1.0000 | ORAL_TABLET | Freq: Every day | ORAL | 1 refills | Status: DC

## 2022-04-07 MED ORDER — PREDNISONE 20 MG PO TABS: 20.0000 mg | ORAL_TABLET | Freq: Every day | ORAL | 0 refills | Status: DC

## 2022-04-07 MED ORDER — VEOZAH 45 MG PO TABS: 45.0000 mg | ORAL_TABLET | Freq: Every day | ORAL | 1 refills | Status: DC

## 2022-04-07 NOTE — Progress Notes (Signed)
Subjective:  Patient ID: Renee Collier, female    DOB: May 09, 1970  Age: 52 y.o. MRN: [id number]  CC: Annual Exam   HPI Rosario Barrere is a 52 y.o. year old female with a history of hypertension here for complete physical exam. Her mammogram is scheduled for later this week and Pap smear not indicated due to history of hysterectomy.  Interval History: The major form of exercise she gets is at her job where she works at [employer].  When she bends to the left she has a throbbing in her left ear. 3 days ago she had left upper eyelid puffiness was diagnosed with Blepharitis and placed on Augmentin during telehealth visit. She had also had a subjective fever. Now has frontal and left maxillary sinus pressure.  She has been using her nasal spray and oral antihistamine.  Her blood pressure is elevated and she endorses adherence with her antihypertensive. She complains of hot flashes which occur both during the day and nighttime and causes insomnia.  She has also become irritable due to this and would like this to be treated. Past Medical History:  Diagnosis Date   Arthritis    hips   Hypertension     Past Surgical History:  Procedure Laterality Date   DILATION AND CURETTAGE OF UTERUS N/A 04/27/2017   Procedure: Ultrasound Guided Dilatation and Curettage;  Surgeon: [name], MD;  Location: [facility];  Service: Gynecology;  Laterality: N/A;   ENDOMETRIAL ABLATION     HYSTEROSCOPY N/A 03/12/2014   Procedure: HYSTEROSCOPY WITH HYDROTHERMAL ABLATION;  Surgeon: Osborne Oman, MD;  Location: [facility];  Service: Gynecology;  Laterality: N/A;   TUBAL LIGATION     VAGINAL HYSTERECTOMY Left 06/10/2020   Procedure: HYSTERECTOMY VAGINAL with LEFT Salpingectomy;  Surgeon: Woodroe Mode, MD;  Location: [facility];  Service: Gynecology;  Laterality: Left;    Family History  Problem Relation Age of Onset   Cancer Mother        cervical   Ovarian cancer Sister    Colon cancer Neg  Hx    Esophageal cancer Neg Hx    Rectal cancer Neg Hx    Stomach cancer Neg Hx     Social History   Socioeconomic History   Marital status: Single    Spouse name: Not on file   Number of children: Not on file   Years of education: Not on file   Highest education level: 12th grade  Occupational History   Not on file  Tobacco Use   Smoking status: Never   Smokeless tobacco: Never  Vaping Use   Vaping Use: Never used  Substance and Sexual Activity   Alcohol use: Yes    Comment: occ   Drug use: No   Sexual activity: Not Currently    Birth control/protection: Surgical  Other Topics Concern   Not on file  Social History Narrative   Not on file   Social Determinants of Health   Financial Resource Strain: Not on file  Food Insecurity: No Food Insecurity (05/05/2020)   Hunger Vital Sign    Worried About Running Out of Food in the Last Year: Never true    Ran Out of Food in the Last Year: Never true  Transportation Needs: No Transportation Needs (05/05/2020)   PRAPARE - Hydrologist (Medical): No    Lack of Transportation (Non-Medical): No  Physical Activity: Not on file  Stress: Not on file  Social Connections:  Not on file    No Known Allergies  Outpatient Medications Prior to Visit  Medication Sig Dispense Refill   amoxicillin-clavulanate (AUGMENTIN) 875-125 MG tablet Take 1 tablet by mouth 2 (two) times daily. 14 tablet 0   cyclobenzaprine (FLEXERIL) 10 MG tablet Take 1 tablet (10 mg total) by mouth 2 (two) times daily as needed for muscle spasms. 60 tablet 2   fluticasone (FLONASE) 50 MCG/ACT nasal spray Place 2 sprays into both nostrils daily. 16 g 1   ondansetron (ZOFRAN) 4 MG tablet Take 1 tablet (4 mg total) by mouth every 8 (eight) hours as needed for nausea or vomiting. 40 tablet 0   lisinopril-hydrochlorothiazide (ZESTORETIC) 10-12.5 MG tablet Take 1 tablet by mouth daily. 90 tablet 1   HYDROcodone-acetaminophen (NORCO) 5-325 MG  tablet Take 1 tablet by mouth 3 (three) times daily as needed. (Patient not taking: Reported on 04/07/2022) 15 tablet 0   meloxicam (MOBIC) 7.5 MG tablet Take 1 tablet (7.5 mg total) by mouth daily. (Patient not taking: Reported on 04/07/2022) 30 tablet 2   No facility-administered medications prior to visit.     ROS Review of Systems  Constitutional:  Negative for activity change and appetite change.  HENT:  Positive for ear pain and sinus pressure. Negative for sore throat.   Respiratory:  Negative for chest tightness, shortness of breath and wheezing.   Cardiovascular:  Negative for chest pain and palpitations.  Gastrointestinal:  Negative for abdominal distention, abdominal pain and constipation.  Genitourinary: Negative.   Musculoskeletal: Negative.   Psychiatric/Behavioral:  Negative for behavioral problems and dysphoric mood.     Objective:  BP (!) 145/95   Pulse 97   Temp 98 F (36.7 C) (Oral)   Ht '5\' 3"'$  (1.6 m)   Wt 217 lb 9.6 oz (98.7 kg)   LMP 06/10/2020   SpO2 97%   BMI 38.55 kg/m      04/07/2022    9:54 AM 10/01/2021    8:49 AM 09/23/2021    3:26 PM  BP/Weight  Systolic BP [id number]  Diastolic BP 95 91 85  Wt. (Lbs) 217.6 219 214.2  BMI 38.55 kg/m2 38.79 kg/m2 37.94 kg/m2      Physical Exam Constitutional:      Appearance: She is well-developed.  HENT:     Head:     Comments: Frontal sinus tenderness    Right Ear: Tympanic membrane normal.     Left Ear: Tympanic membrane normal.     Mouth/Throat:     Mouth: Mucous membranes are moist.  Eyes:     Comments: Slight edema of left upper eyelid  Cardiovascular:     Rate and Rhythm: Normal rate.     Heart sounds: Normal heart sounds. No murmur heard. Pulmonary:     Effort: Pulmonary effort is normal.     Breath sounds: Normal breath sounds. No wheezing or rales.  Chest:     Chest wall: No tenderness.  Abdominal:     General: Bowel sounds are normal. There is no distension.     Palpations: Abdomen is  soft. There is no mass.     Tenderness: There is no abdominal tenderness.  Musculoskeletal:        General: Normal range of motion.     Right lower leg: No edema.     Left lower leg: No edema.  Neurological:     Mental Status: She is alert and oriented to person, place, and time.  Psychiatric:  Mood and Affect: Mood normal.        Latest Ref Rng & Units 02/10/2022    8:36 AM 09/23/2021    4:01 PM 03/31/2021    9:43 AM  CMP  Glucose 70 - 99 mg/dL 96  107  98   BUN 6 - 24 mg/dL '8  11  9   '$ Creatinine 0.57 - 1.00 mg/dL 0.73  0.69  0.77   Sodium 134 - 144 mmol/L 140  141  138   Potassium 3.5 - 5.2 mmol/L 4.2  4.0  4.2   Chloride 96 - 106 mmol/L 103  100  106   CO2 20 - 29 mmol/L '22  23  24   '$ Calcium 8.7 - 10.2 mg/dL 8.8  10.3  9.6   Total Protein 6.0 - 8.5 g/dL 6.9   7.4   Total Bilirubin 0.0 - 1.2 mg/dL 0.2   0.3   Alkaline Phos 44 - 121 IU/L 97   113   AST 0 - 40 IU/L 13   8   ALT 0 - 32 IU/L 7   6     Lipid Panel     Component Value Date/Time   CHOL 143 02/10/2022 0836   TRIG 80 02/10/2022 0836   HDL 49 02/10/2022 0836   CHOLHDL 3.2 01/01/2020 1414   CHOLHDL 2.4 Ratio 11/23/2006 2033   VLDL 20 11/23/2006 2033   LDLCALC 78 02/10/2022 0836    CBC    Component Value Date/Time   WBC 5.6 02/10/2022 0836   WBC 6.3 02/13/2021 1100   RBC 4.46 02/10/2022 0836   RBC 4.43 02/13/2021 1100   HGB 13.4 02/10/2022 0836   HCT 40.5 02/10/2022 0836   PLT 327 02/10/2022 0836   MCV 91 02/10/2022 0836   MCH 30.0 02/10/2022 0836   MCH 30.0 02/13/2021 1100   MCHC 33.1 02/10/2022 0836   MCHC 33.0 02/13/2021 1100   RDW 13.7 02/10/2022 0836   LYMPHSABS 3.6 (H) 02/10/2022 0836   MONOABS 0.6 01/05/2021 1754   EOSABS 0.1 02/10/2022 0836   BASOSABS 0.0 02/10/2022 0836    Lab Results  Component Value Date   HGBA1C 5.6 02/10/2022    Assessment & Plan:  1. Annual physical exam Counseled on 150 minutes of exercise per week, healthy eating (including decreased daily intake of  saturated fats, cholesterol, added sugars, sodium), routine healthcare maintenance.   2. Vasomotor symptoms due to menopause Counseled on available treatment options including hormonal and nonhormonal After shared decision making she will proceed with nonhormonal treatment - Fezolinetant (VEOZAH) 45 MG TABS; Take 1 tablet (45 mg total) by mouth daily.  Dispense: 90 tablet; Refill: 1  3. Hypertension, unspecified type Uncontrolled Lisinopril dose increased from 10/12.5 to 20/'25mg'$  Counseled on blood pressure goal of less than 130/80, low-sodium, DASH diet, medication compliance, 150 minutes of moderate intensity exercise per week. Discussed medication compliance, adverse effects. - lisinopril-hydrochlorothiazide (ZESTORETIC) 20-25 MG tablet; Take 1 tablet by mouth daily.  Dispense: 90 tablet; Refill: 1  4. Acute non-recurrent maxillary sinusitis Currently on Augmentin - predniSONE (DELTASONE) 20 MG tablet; Take 1 tablet (20 mg total) by mouth daily with breakfast.  Dispense: 5 tablet; Refill: 0    Meds ordered this encounter  Medications   Fezolinetant (VEOZAH) 45 MG TABS    Sig: Take 1 tablet (45 mg total) by mouth daily.    Dispense:  90 tablet    Refill:  1   predniSONE (DELTASONE) 20 MG tablet    Sig:  Take 1 tablet (20 mg total) by mouth daily with breakfast.    Dispense:  5 tablet    Refill:  0   lisinopril-hydrochlorothiazide (ZESTORETIC) 20-25 MG tablet    Sig: Take 1 tablet by mouth daily.    Dispense:  90 tablet    Refill:  1    Discontinue lisinopril/HCTZ 10/12.5    Return in about 6 months (around 10/08/2022) for Chronic medical conditions.        Charlott Rakes, MD, FAAFP. [facility], [place]   04/07/2022, 12:06 PM

## 2022-04-07 NOTE — Patient Instructions (Signed)
Managing Hot Flashes During Menopause You will learn what hot flashes/night sweats are, what causes them and what the treatment methods are. To view the content, go to this web address: [email or web address]  This video will expire on: 01/14/2024. If you need access to this video following this date, please reach out to the healthcare provider who assigned it to you. This information is not intended to replace advice given to you by your health care provider. Make sure you discuss any questions you have with your health care provider. Elsevier Patient Education  [place].

## 2022-04-08 ENCOUNTER — Other Ambulatory Visit: Payer: Self-pay | Admitting: Family Medicine

## 2022-04-08 MED ORDER — PAROXETINE HCL 10 MG PO TABS: 10.0000 mg | ORAL_TABLET | Freq: Every day | ORAL | 1 refills | Status: DC

## 2022-04-09 ENCOUNTER — Ambulatory Visit
Admission: RE | Admit: 2022-04-09 | Discharge: 2022-04-09 | Disposition: A | Discharge status: Home / Self Care | Payer: 59 | Source: Ambulatory Visit | Attending: Family Medicine | Admitting: Family Medicine

## 2022-04-09 DIAGNOSIS — Z1231 Encounter for screening mammogram for malignant neoplasm of breast: Secondary | ICD-10-CM

## 2022-04-13 ENCOUNTER — Telehealth: Payer: Self-pay | Admitting: Physical Therapy

## 2022-04-13 ENCOUNTER — Encounter: Payer: 59 | Admitting: Physical Therapy

## 2022-04-13 NOTE — Telephone Encounter (Signed)
Attempted to contact patient due to missed PT appointment. Left VM informing patient of missed appointment and reminder of next scheduled appointment. Advised of attendance policy.    Hilda Blades, PT, DPT, LAT, ATC 04/13/22  3:58 PM Phone: [phone] Fax: [phone]

## 2022-04-16 ENCOUNTER — Ambulatory Visit: Payer: 59 | Admitting: Physical Therapy

## 2022-04-22 ENCOUNTER — Ambulatory Visit: Payer: 59 | Attending: Family Medicine | Admitting: Physical Therapy

## 2022-04-27 ENCOUNTER — Telehealth: Payer: Self-pay | Admitting: Physical Therapy

## 2022-04-27 NOTE — Telephone Encounter (Signed)
Left voicemail regarding no show on March 21 st and reminded of attendance policy. Based on attendance policy and length of time since last seen in clinic, patient will need a new referral to resume physical therapy.

## 2022-04-28 ENCOUNTER — Ambulatory Visit: Payer: 59 | Admitting: Physical Therapy

## 2022-05-03 ENCOUNTER — Ambulatory Visit: Payer: Self-pay | Admitting: *Deleted

## 2022-05-03 NOTE — Telephone Encounter (Signed)
Noted  

## 2022-05-03 NOTE — Telephone Encounter (Signed)
Back pain   PPer agent: "Patient states that she has been experiencing pain in the middle of her back for 7 days. No appointments available "     Chief Complaint: Back pain Symptoms: mid back, intensity varies 10/10 at times "But I have muscle relaxers I got in Jan. for my hip and they are helping."  "I've been doing a lot of walking, standing at work." Frequency:  1 week Pertinent Negatives: Patient denies dysuria, fever, pain does not radiate Disposition: [] ED /[] Urgent Care (no appt availability in office) / [] Appointment(In office/virtual)/ []  [facility]/ [] Home Care/ [] Refused Recommended Disposition /[x] [facility]/ []  Follow-up with PCP Additional Notes: No availability at practice. [facility], states will follow disposition.  CAre advise provided, verbalizes understanding.  Reason for Disposition  [1] Pain radiates into the thigh or further down the leg AND [2] one leg    Rates at 10/10 but muscle relaxer help  Answer Assessment - Initial Assessment Questions 1. ONSET: "When did the pain begin?"      1 week 2. LOCATION: "Where does it hurt?" (upper, mid or lower back)     Middle  3. SEVERITY: "How bad is the pain?"  (e.g., Scale 1-10; mild, moderate, or severe)   - MILD (1-3): Doesn't interfere with normal activities.    - MODERATE (4-7): Interferes with normal activities or awakens from sleep.    - SEVERE (8-10): Excruciating pain, unable to do any normal activities.      10/10 4. PATTERN: "Is the pain constant?" (e.g., yes, no; constant, intermittent)      Comes and goes 5. RADIATION: "Does the pain shoot into your legs or somewhere else?"     No 6. CAUSE:  "What do you think is causing the back pain?"      Maybe over use. 7. BACK OVERUSE:  "Any recent lifting of heavy objects, strenuous work or exercise?"     A lot of walking 8. MEDICINES: "What have you taken so far for the pain?" (e.g., nothing, acetaminophen, NSAIDS)     Muscle  relaxer 9. NEUROLOGIC SYMPTOMS: "Do you have any weakness, numbness, or problems with bowel/bladder control?"     No 10. OTHER SYMPTOMS: "Do you have any other symptoms?" (e.g., fever, abdomen pain, burning with urination, blood in urine)       Loss of appetite.  Protocols used: Back Pain-A-AH

## 2022-05-16 ENCOUNTER — Other Ambulatory Visit: Payer: Self-pay | Admitting: Family Medicine

## 2022-06-08 ENCOUNTER — Ambulatory Visit: Payer: Self-pay

## 2022-06-08 NOTE — Telephone Encounter (Signed)
Spoke with patient . Verified name & DOB   Patient voiced that she having allergy s/s and it not getting better. Requesting video with PCP if one's available. Appointment for 06/10/22 scheduled

## 2022-06-08 NOTE — Telephone Encounter (Signed)
  Chief Complaint: URI Symptoms: cough with congestion, SOB with activity, chest tightness, possible low grade fever  Frequency: Sunday  Pertinent Negatives: NA Disposition: [] ED /[x] Urgent Care (no appt availability in office) / [] Appointment(In office/virtual)/ []  [facility]/ [] Home Care/ [] Refused Recommended Disposition /[] [facility]/ []  Follow-up with PCP Additional Notes: pt states she was on abx for URI but sx have come back and wanting her lungs checked out. Advised no appts until 06/23/22, pt wanted to go ahead and schedule but also scheduled UC appt for tomorrow at 0830 as well.   Reason for Disposition  [1] Sinus pain (not just congestion) AND [2] fever  Answer Assessment - Initial Assessment Questions 2. ONSET: "When did the sinus pain start?"  (e.g., hours, days)      Sunday  3. SEVERITY: "How bad is the pain?"   (Scale 1-10; mild, moderate or severe)   - MILD (1-3): doesn't interfere with normal activities    - MODERATE (4-7): interferes with normal activities (e.g., work or school) or awakens from sleep   - SEVERE (8-10): excruciating pain and patient unable to do any normal activities        Moderate  4. RECURRENT SYMPTOM: "Have you ever had sinus problems before?" If Yes, ask: "When was the last time?" and "What happened that time?"      Was on abx previous 5. NASAL CONGESTION: "Is the nose blocked?" If Yes, ask: "Can you open it or must you breathe through your mouth?"     yes 7. FEVER: "Do you have a fever?" If Yes, ask: "What is it, how was it measured, and when did it start?"      Low grade possibly.  8. OTHER SYMPTOMS: "Do you have any other symptoms?" (e.g., sore throat, cough, earache, difficulty breathing)     Cough, SOB with activity, wheezing, sore throat  Protocols used: Sinus Pain or Congestion-A-AH

## 2022-06-08 NOTE — Telephone Encounter (Signed)
Noted  

## 2022-06-09 ENCOUNTER — Ambulatory Visit (HOSPITAL_COMMUNITY): Payer: Self-pay

## 2022-06-10 ENCOUNTER — Telehealth: Payer: Self-pay

## 2022-06-10 ENCOUNTER — Ambulatory Visit: Payer: 59 | Attending: Family Medicine | Admitting: Family Medicine

## 2022-06-10 ENCOUNTER — Encounter: Payer: Self-pay | Admitting: Family Medicine

## 2022-06-10 ENCOUNTER — Ambulatory Visit
Admission: RE | Admit: 2022-06-10 | Discharge: 2022-06-10 | Disposition: A | Discharge status: Home / Self Care | Payer: 59 | Source: Ambulatory Visit | Attending: Family Medicine | Admitting: Family Medicine

## 2022-06-10 VITALS — BP 157/98 | HR 92 | Temp 98.6°F | Ht 63.0 in | Wt 220.4 lb

## 2022-06-10 DIAGNOSIS — R0981 Nasal congestion: Secondary | ICD-10-CM | POA: Diagnosis not present

## 2022-06-10 DIAGNOSIS — I10 Essential (primary) hypertension: Secondary | ICD-10-CM

## 2022-06-10 DIAGNOSIS — J189 Pneumonia, unspecified organism: Secondary | ICD-10-CM

## 2022-06-10 DIAGNOSIS — R059 Cough, unspecified: Secondary | ICD-10-CM | POA: Diagnosis not present

## 2022-06-10 MED ORDER — LEVOFLOXACIN 500 MG PO TABS: 500.0000 mg | ORAL_TABLET | Freq: Every day | ORAL | 0 refills | Status: AC

## 2022-06-10 MED ORDER — HYDROCOD POLI-CHLORPHE POLI ER 10-8 MG/5ML PO SUER: 5.0000 mL | Freq: Two times a day (BID) | ORAL | 0 refills | Status: DC | PRN

## 2022-06-10 MED ORDER — ALBUTEROL SULFATE HFA 108 (90 BASE) MCG/ACT IN AERS: 2.0000 | INHALATION_SPRAY | Freq: Four times a day (QID) | RESPIRATORY_TRACT | 2 refills | Status: DC | PRN

## 2022-06-10 NOTE — Progress Notes (Signed)
Subjective:  Patient ID: Renee Collier, female    DOB: 1970/11/23  Age: 52 y.o. MRN: [id number]  CC: Nasal Congestion   HPI Renee Collier is a 52 y.o. year old female with a history of hypertension seen for an acute visit.  Interval History:  She has had a headache, cough (with production of clear  mucus), chest congestion and feels like something is in her chest . She has no sinus pressure. Complains she is unable to eat but she can drink.  Complains of no energy and feels like someone is punching her. She had a fever of 100.2 at home.  She did a COVID test at home which was negative. In 04/2022 she did receive a prescription for Augmentin for treatment of sinusitis.  Past Medical History:  Diagnosis Date   Arthritis    hips   Hypertension     Past Surgical History:  Procedure Laterality Date   DILATION AND CURETTAGE OF UTERUS N/A 04/27/2017   Procedure: Ultrasound Guided Dilatation and Curettage;  Surgeon: Allie Bossier, MD;  Location: [facility];  Service: Gynecology;  Laterality: N/A;   ENDOMETRIAL ABLATION     HYSTEROSCOPY N/A 03/12/2014   Procedure: HYSTEROSCOPY WITH HYDROTHERMAL ABLATION;  Surgeon: Tereso Newcomer, MD;  Location: [facility];  Service: Gynecology;  Laterality: N/A;   TUBAL LIGATION     VAGINAL HYSTERECTOMY Left 06/10/2020   Procedure: HYSTERECTOMY VAGINAL with LEFT Salpingectomy;  Surgeon: Adam Phenix, MD;  Location: [facility];  Service: Gynecology;  Laterality: Left;    Family History  Problem Relation Age of Onset   Cancer Mother        cervical   Ovarian cancer Sister    Colon cancer Neg Hx    Esophageal cancer Neg Hx    Rectal cancer Neg Hx    Stomach cancer Neg Hx     Social History   Socioeconomic History   Marital status: Single    Spouse name: Not on file   Number of children: Not on file   Years of education: Not on file   Highest education level: 12th grade  Occupational History   Not on file  Tobacco Use   Smoking  status: Never   Smokeless tobacco: Never  Vaping Use   Vaping Use: Never used  Substance and Sexual Activity   Alcohol use: Yes    Comment: occ   Drug use: No   Sexual activity: Not Currently    Birth control/protection: Surgical  Other Topics Concern   Not on file  Social History Narrative   Not on file   Social Determinants of Health   Financial Resource Strain: Not on file  Food Insecurity: No Food Insecurity (05/05/2020)   Hunger Vital Sign    Worried About Running Out of Food in the Last Year: Never true    Ran Out of Food in the Last Year: Never true  Transportation Needs: No Transportation Needs (05/05/2020)   PRAPARE - Administrator, Civil Service (Medical): No    Lack of Transportation (Non-Medical): No  Physical Activity: Not on file  Stress: Not on file  Social Connections: Not on file    No Known Allergies  Outpatient Medications Prior to Visit  Medication Sig Dispense Refill   fluticasone (FLONASE) 50 MCG/ACT nasal spray Place 2 sprays into both nostrils daily. 16 g 1   lisinopril-hydrochlorothiazide (ZESTORETIC) 20-25 MG tablet Take 1 tablet by mouth daily. 90 tablet 1   PARoxetine (  PAXIL) 10 MG tablet Take 1 tablet (10 mg total) by mouth daily. 90 tablet 1   lisinopril-hydrochlorothiazide (ZESTORETIC) 10-12.5 MG tablet TAKE 1 TABLET BY MOUTH DAILY 90 tablet 0   cyclobenzaprine (FLEXERIL) 10 MG tablet Take 1 tablet (10 mg total) by mouth 2 (two) times daily as needed for muscle spasms. (Patient not taking: Reported on 06/10/2022) 60 tablet 2   amoxicillin-clavulanate (AUGMENTIN) 875-125 MG tablet Take 1 tablet by mouth 2 (two) times daily. (Patient not taking: Reported on 06/10/2022) 14 tablet 0   Fezolinetant (VEOZAH) 45 MG TABS Take 1 tablet (45 mg total) by mouth daily. (Patient not taking: Reported on 06/10/2022) 90 tablet 1   ondansetron (ZOFRAN) 4 MG tablet Take 1 tablet (4 mg total) by mouth every 8 (eight) hours as needed for nausea or vomiting.  (Patient not taking: Reported on 06/10/2022) 40 tablet 0   predniSONE (DELTASONE) 20 MG tablet Take 1 tablet (20 mg total) by mouth daily with breakfast. (Patient not taking: Reported on 06/10/2022) 5 tablet 0   No facility-administered medications prior to visit.     ROS Review of Systems  Constitutional:  Positive for fatigue. Negative for activity change and appetite change.  HENT:  Positive for congestion and sore throat. Negative for sinus pressure.   Respiratory:  Positive for cough. Negative for chest tightness, shortness of breath and wheezing.   Cardiovascular:  Negative for chest pain and palpitations.  Gastrointestinal:  Negative for abdominal distention, abdominal pain and constipation.  Genitourinary: Negative.   Musculoskeletal: Negative.   Psychiatric/Behavioral:  Negative for behavioral problems and dysphoric mood.     Objective:  BP (!) 157/98   Pulse 92   Temp 98.6 F (37 C) (Oral)   Ht 5\' 3"  (1.6 m)   Wt 220 lb 6.4 oz (100 kg)   LMP 06/10/2020   SpO2 99%   BMI 39.04 kg/m      06/10/2022   10:39 AM 06/10/2022    9:54 AM 04/07/2022    9:54 AM  BP/Weight  Systolic BP 157 160 145  Diastolic BP 98 100 95  Wt. (Lbs)  220.4 217.6  BMI  39.04 kg/m2 38.55 kg/m2      Physical Exam Constitutional:      Appearance: She is well-developed.  HENT:     Right Ear: Tympanic membrane normal.     Left Ear: Tympanic membrane normal.     Nose: Nose normal.     Mouth/Throat:     Mouth: Mucous membranes are moist.  Cardiovascular:     Rate and Rhythm: Normal rate.     Heart sounds: Normal heart sounds. No murmur heard. Pulmonary:     Effort: Pulmonary effort is normal.     Breath sounds: No wheezing or rales.     Comments: Reduced air entry in left lung base Chest:     Chest wall: No tenderness.  Abdominal:     General: Bowel sounds are normal. There is no distension.     Palpations: Abdomen is soft. There is no mass.     Tenderness: There is no abdominal  tenderness.  Musculoskeletal:        General: Normal range of motion.     Right lower leg: No edema.     Left lower leg: No edema.  Neurological:     Mental Status: She is alert and oriented to person, place, and time.  Psychiatric:        Mood and Affect: Mood normal.  Latest Ref Rng & Units 02/10/2022    8:36 AM 09/23/2021    4:01 PM 03/31/2021    9:43 AM  CMP  Glucose 70 - 99 mg/dL 96  829  98   BUN 6 - 24 mg/dL 8  11  9    Creatinine 0.57 - 1.00 mg/dL 5.62  1.30  8.65   Sodium 134 - 144 mmol/L 140  141  138   Potassium 3.5 - 5.2 mmol/L 4.2  4.0  4.2   Chloride 96 - 106 mmol/L 103  100  106   CO2 20 - 29 mmol/L 22  23  24    Calcium 8.7 - 10.2 mg/dL 8.8  78.4  9.6   Total Protein 6.0 - 8.5 g/dL 6.9   7.4   Total Bilirubin 0.0 - 1.2 mg/dL 0.2   0.3   Alkaline Phos 44 - 121 IU/L 97   113   AST 0 - 40 IU/L 13   8   ALT 0 - 32 IU/L 7   6     Lipid Panel     Component Value Date/Time   CHOL 143 02/10/2022 0836   TRIG 80 02/10/2022 0836   HDL 49 02/10/2022 0836   CHOLHDL 3.2 01/01/2020 1414   CHOLHDL 2.4 Ratio 11/23/2006 2033   VLDL 20 11/23/2006 2033   LDLCALC 78 02/10/2022 0836    CBC    Component Value Date/Time   WBC 5.6 02/10/2022 0836   WBC 6.3 02/13/2021 1100   RBC 4.46 02/10/2022 0836   RBC 4.43 02/13/2021 1100   HGB 13.4 02/10/2022 0836   HCT 40.5 02/10/2022 0836   PLT 327 02/10/2022 0836   MCV 91 02/10/2022 0836   MCH 30.0 02/10/2022 0836   MCH 30.0 02/13/2021 1100   MCHC 33.1 02/10/2022 0836   MCHC 33.0 02/13/2021 1100   RDW 13.7 02/10/2022 0836   LYMPHSABS 3.6 (H) 02/10/2022 0836   MONOABS 0.6 01/05/2021 1754   EOSABS 0.1 02/10/2022 0836   BASOSABS 0.0 02/10/2022 0836    Lab Results  Component Value Date   HGBA1C 5.6 02/10/2022    Assessment & Plan:  1. Community acquired pneumonia, unspecified laterality Clinical suspicion of Pneumonia CXR ordered - albuterol (VENTOLIN HFA) 108 (90 Base) MCG/ACT inhaler; Inhale 2 puffs into the  lungs every 6 (six) hours as needed for wheezing or shortness of breath.  Dispense: 8 g; Refill: 2 - chlorpheniramine-HYDROcodone (TUSSIONEX) 10-8 MG/5ML; Take 5 mLs by mouth every 12 (twelve) hours as needed for cough.  Dispense: 115 mL; Refill: 0 - levofloxacin (LEVAQUIN) 500 MG tablet; Take 1 tablet (500 mg total) by mouth daily for 7 days.  Dispense: 7 tablet; Refill: 0  2. Primary hypertension Uncontrolled Lisinopril/HCTZ dose was increased at her last visit Acute illness likely contributing to elevation in BP Counseled on blood pressure goal of less than 130/80, low-sodium, DASH diet, medication compliance, 150 minutes of moderate intensity exercise per week. Discussed medication compliance, adverse effects.    Meds ordered this encounter  Medications   albuterol (VENTOLIN HFA) 108 (90 Base) MCG/ACT inhaler    Sig: Inhale 2 puffs into the lungs every 6 (six) hours as needed for wheezing or shortness of breath.    Dispense:  8 g    Refill:  2   chlorpheniramine-HYDROcodone (TUSSIONEX) 10-8 MG/5ML    Sig: Take 5 mLs by mouth every 12 (twelve) hours as needed for cough.    Dispense:  115 mL    Refill:  0  levofloxacin (LEVAQUIN) 500 MG tablet    Sig: Take 1 tablet (500 mg total) by mouth daily for 7 days.    Dispense:  7 tablet    Refill:  0    Follow-up: Return if symptoms worsen or fail to improve, for previously scheduled appointment.       Hoy Register, MD, FAAFP. [facility] [place], [place] [phone]   06/10/2022, 12:23 PM

## 2022-06-10 NOTE — Telephone Encounter (Signed)
Copied from CRM [id number]. Topic: General - Inquiry >> Jun 10, 2022 11:29 AM Haroldine Laws wrote: Reason for CRM: Pt went to the imaging place for an xray and they told her that there is no order in the system.  She wanted to go home and come back if someone will call her when an order is in the system,  CB#  [phone]

## 2022-06-10 NOTE — Telephone Encounter (Signed)
Pt has gotten chest x-ray done.

## 2022-06-10 NOTE — Patient Instructions (Signed)
Community-Acquired Pneumonia, Adult Pneumonia is a lung infection that causes inflammation and the buildup of mucus and fluids in the lungs. This may cause coughing and difficulty breathing. Community-acquired pneumonia is pneumonia that develops in people who are not, and have not recently been, in a hospital or other health care facility. Usually, pneumonia develops as a result of an illness that is caused by a virus, such as the common cold and the flu (influenza). It can also be caused by bacteria or fungi. While the common cold and influenza can pass from person to person (are contagious), pneumonia itself is not considered contagious. What are the causes? This condition may be caused by: Viruses. Bacteria. Fungi. What increases the risk? The following factors may make you more likely to develop this condition: Being over age 65 or having certain medical conditions, such as: A long-term (chronic) disease, such as: chronic obstructive pulmonary disease (COPD), asthma, heart failure, diabetes, or kidney disease. A condition that increases the risk of breathing in (aspirating) mucus and other fluids from your mouth and nose. A weakened body defense system (immune system). Having had your spleen removed (splenectomy). The spleen is the organ that helps fight germs and infections. Not cleaning your teeth and gums well (poor dental hygiene). Using tobacco products. Traveling to places where germs that cause pneumonia are present or being near certain animals or animal habitats that could have germs that cause pneumonia. What are the signs or symptoms? Symptoms of this condition include: A dry cough or a wet (productive) cough. A fever, sweating, or chills. Chest pain, especially when breathing deeply or coughing. Fast breathing, difficulty breathing, or shortness of breath. Tiredness (fatigue) and muscle aches. How is this diagnosed? This condition may be diagnosed based on your medical  history or a physical exam. You may also have tests, including: Imaging, such as a chest X-ray or lung ultrasound. Tests of: The level of oxygen and other gases in your blood. Mucus from your lungs (sputum). Fluid around your lungs (pleural fluid). Your urine. How is this treated? Treatment for this condition depends on many factors, such as the cause of your pneumonia, your medicines, and other medical conditions that you have. For most adults, pneumonia may be treated at home. In some cases, treatment must happen in a hospital and may include: Medicines that are given by mouth (orally) or through an IV, including: Antibiotic medicines, if bacteria caused the pneumonia. Medicines that kill viruses (antiviral medicines), if a virus caused the pneumonia. Oxygen therapy. Severe pneumonia, although rare, may require the following treatments: Mechanical ventilation.This procedure uses a machine to help you breathe if you cannot breathe well on your own or maintain a safe level of blood oxygen. Thoracentesis. This procedure removes any buildup of pleural fluid to help with breathing. Follow these instructions at home:  Medicines Take over-the-counter and prescription medicines only as told by your health care provider. Take cough medicine only if you have trouble sleeping. Cough medicine can prevent your body from removing mucus from your lungs. If you were prescribed antibiotics, take them as told by your health care provider. Do not stop taking the antibiotic even if you start to feel better. Lifestyle     Do not drink alcohol. Do not use any products that contain nicotine or tobacco. These products include cigarettes, chewing tobacco, and vaping devices, such as e-cigarettes. If you need help quitting, ask your health care provider. Eat a healthy diet. This includes plenty of vegetables, fruits, whole grains, low-fat   dairy products, and lean protein. General instructions Rest a lot and  get at least 8 hours of sleep each night. Sleep in a partly upright position at night. Place a few pillows under your head or sleep in a reclining chair. Return to your normal activities as told by your health care provider. Ask your health care provider what activities are safe for you. Drink enough fluid to keep your urine pale yellow. This helps to thin the mucus in your lungs. If your throat is sore, gargle with a mixture of salt and water 3-4 times a day or as needed. To make salt water, completely dissolve -1 tsp (3-6 g) of salt in 1 cup (237 mL) of warm water. Keep all follow-up visits. How is this prevented? You can lower your risk of developing community-acquired pneumonia by: Getting the pneumonia vaccine. There are different types and schedules of pneumonia vaccines. Ask your health care provider which option is best for you. Consider getting the pneumonia vaccine if: You are older than 50 years of age. You are 19-65 years of age and are receiving cancer treatment, have chronic lung disease, or have other medical conditions that affect your immune system. Ask your health care provider if this applies to you. Getting your influenza vaccine every year. Ask your health care provider which type of vaccine is best for you. Getting regular dental checkups. Washing your hands often with soap and water for at least 20 seconds. If soap and water are not available, use hand sanitizer. Contact a health care provider if: You have a fever. You have trouble sleeping because you cannot control your cough with cough medicine. Get help right away if: Your shortness of breath becomes worse. Your chest pain increases. Your sickness becomes worse, especially if you are an older adult or have a weak immune system. You cough up blood. These symptoms may be an emergency. Get help right away. Call 911. Do not wait to see if the symptoms will go away. Do not drive yourself to the  hospital. Summary Pneumonia is an infection of the lungs. Community-acquired pneumonia develops in people who have not been in the hospital. It can be caused by bacteria, viruses, or fungi. This condition may be treated with antibiotics or antiviral medicines. Severe pneumonia may require a hospital stay and treatment to help with breathing. This information is not intended to replace advice given to you by your health care provider. Make sure you discuss any questions you have with your health care provider. Document Revised: 03/18/2021 Document Reviewed: 03/18/2021 Elsevier Patient Education  2023 [employer]vier Inc.  

## 2022-06-10 NOTE — Progress Notes (Signed)
Cough Chest congestion Sore throat

## 2022-06-11 ENCOUNTER — Encounter: Payer: Self-pay | Admitting: Family Medicine

## 2022-06-23 ENCOUNTER — Ambulatory Visit: Payer: 59 | Admitting: Physician Assistant

## 2022-07-07 ENCOUNTER — Telehealth: Payer: 59 | Admitting: Family

## 2022-07-07 DIAGNOSIS — J019 Acute sinusitis, unspecified: Secondary | ICD-10-CM

## 2022-07-07 MED ORDER — PREDNISONE 20 MG PO TABS
40.0000 mg | ORAL_TABLET | Freq: Every day | ORAL | 0 refills | Status: AC
Start: 2022-07-07 — End: 2022-07-12

## 2022-07-07 MED ORDER — DOXYCYCLINE HYCLATE 100 MG PO TABS
100.0000 mg | ORAL_TABLET | Freq: Two times a day (BID) | ORAL | 0 refills | Status: DC
Start: 2022-07-07 — End: 2022-12-14

## 2022-07-07 NOTE — Progress Notes (Signed)
Virtual Visit Consent   Renee Collier, you are scheduled for a virtual visit with a [facility] provider today. Just as with appointments in the office, your consent must be obtained to participate. Your consent will be active for this visit and any virtual visit you may have with one of our providers in the next 365 days. If you have a MyChart account, a copy of this consent can be sent to you electronically.  As this is a virtual visit, video technology does not allow for your provider to perform a traditional examination. This may limit your provider's ability to fully assess your condition. If your provider identifies any concerns that need to be evaluated in person or the need to arrange testing (such as labs, EKG, etc.), we will make arrangements to do so. Although advances in technology are sophisticated, we cannot ensure that it will always work on either your end or our end. If the connection with a video visit is poor, the visit may have to be switched to a telephone visit. With either a video or telephone visit, we are not always able to ensure that we have a secure connection.  By engaging in this virtual visit, you consent to the provision of healthcare and authorize for your insurance to be billed (if applicable) for the services provided during this visit. Depending on your insurance coverage, you may receive a charge related to this service.  I need to obtain your verbal consent now. Are you willing to proceed with your visit today? Jonelle Wates has provided verbal consent on 07/07/2022 for a virtual visit (video or telephone). Jannifer Rodney, FNP  Date: 07/07/2022 9:39 AM  Virtual Visit via Video Note   I, Jannifer Rodney, connected with  Brienna Biesecker  ([id number], 1970-06-14) on 07/07/22 at  9:45 AM EDT by a video-enabled telemedicine application and verified that I am speaking with the correct person using two identifiers.  Location: Patient: Virtual Visit Location Patient:  Home Provider: Virtual Visit Location Provider: Home Office   I discussed the limitations of evaluation and management by telemedicine and the availability of in person appointments. The patient expressed understanding and agreed to proceed.    History of Present Illness: Renee Collier is a 52 y.o. who identifies as a female who was assigned female at birth, and is being seen today for sore throat cough and congestion that started yesterday. She was treated for CAP on 06/10/22 and given Levofloxacin. Reports she was feeling better, but feels similar to what she started feeling like.   HPI: Sore Throat  This is a recurrent problem. The current episode started yesterday. The problem has been gradually worsening. There has been no fever. Associated symptoms include congestion, coughing, ear pain (left), headaches and shortness of breath (when walking). Pertinent negatives include no trouble swallowing. Treatments tried: zyrtec and flonase. The treatment provided mild relief.    Problems:  Patient Active Problem List   Diagnosis Date Noted   Right carpal tunnel syndrome 10/30/2021   Left carpal tunnel syndrome 10/30/2021   Prediabetes 03/31/2021   Menometrorrhagia 06/10/2020   Primary osteoarthritis of left hip 10/18/2019   Vaginal discharge 12/14/2018   Status post HTA endometrial ablation 04/10/2014   Abnormal uterine bleeding (AUB) 12/18/2013    Allergies: No Known Allergies Medications:  Current Outpatient Medications:    doxycycline (VIBRA-TABS) 100 MG tablet, Take 1 tablet (100 mg total) by mouth 2 (two) times daily., Disp: 20 tablet, Rfl: 0   predniSONE (DELTASONE) 20 MG tablet,  Take 2 tablets (40 mg total) by mouth daily with breakfast for 5 days., Disp: 10 tablet, Rfl: 0   albuterol (VENTOLIN HFA) 108 (90 Base) MCG/ACT inhaler, Inhale 2 puffs into the lungs every 6 (six) hours as needed for wheezing or shortness of breath., Disp: 8 g, Rfl: 2   chlorpheniramine-HYDROcodone  (TUSSIONEX) 10-8 MG/5ML, Take 5 mLs by mouth every 12 (twelve) hours as needed for cough., Disp: 115 mL, Rfl: 0   cyclobenzaprine (FLEXERIL) 10 MG tablet, Take 1 tablet (10 mg total) by mouth 2 (two) times daily as needed for muscle spasms. (Patient not taking: Reported on 06/10/2022), Disp: 60 tablet, Rfl: 2   fluticasone (FLONASE) 50 MCG/ACT nasal spray, Place 2 sprays into both nostrils daily., Disp: 16 g, Rfl: 1   lisinopril-hydrochlorothiazide (ZESTORETIC) 20-25 MG tablet, Take 1 tablet by mouth daily., Disp: 90 tablet, Rfl: 1   PARoxetine (PAXIL) 10 MG tablet, Take 1 tablet (10 mg total) by mouth daily., Disp: 90 tablet, Rfl: 1  Observations/Objective: Patient is well-developed, well-nourished in no acute distress.  Resting comfortably  at home.  Head is normocephalic, atraumatic.  No labored breathing.  Speech is clear and coherent with logical content.  Patient is alert and oriented at baseline.  Nasal congestion   Assessment and Plan: 1. Acute sinusitis, recurrence not specified, unspecified location - doxycycline (VIBRA-TABS) 100 MG tablet; Take 1 tablet (100 mg total) by mouth 2 (two) times daily.  Dispense: 20 tablet; Refill: 0 - predniSONE (DELTASONE) 20 MG tablet; Take 2 tablets (40 mg total) by mouth daily with breakfast for 5 days.  Dispense: 10 tablet; Refill: 0  - Take meds as prescribed - Use a cool mist humidifier  -Use saline nose sprays frequently -Force fluids -For any cough or congestion  Use plain Mucinex- regular strength or max strength is fine -For fever or aces or pains- take tylenol or ibuprofen. -Throat lozenges if help -Follow up if symptoms worsen or do not improve   Follow Up Instructions: I discussed the assessment and treatment plan with the patient. The patient was provided an opportunity to ask questions and all were answered. The patient agreed with the plan and demonstrated an understanding of the instructions.  A copy of instructions were sent to  the patient via MyChart unless otherwise noted below.     The patient was advised to call back or seek an in-person evaluation if the symptoms worsen or if the condition fails to improve as anticipated.  Time:  I spent 8 minutes with the patient via telehealth technology discussing the above problems/concerns.    Jannifer Rodney, FNP

## 2022-07-22 ENCOUNTER — Ambulatory Visit: Payer: Self-pay

## 2022-07-22 NOTE — Telephone Encounter (Signed)
Chief Complaint: Fatigue  Symptoms: general weakness, cough Frequency: Daily x1 month Pertinent Negatives: Patient denies pain, fever, SOB Disposition: [] ED /[] Urgent Care (no appt availability in office) / [x] Appointment(In office/virtual)/ []  [place] [facility]/ [] Home Care/ [] Refused Recommended Disposition /[] [place] [facility]/ []  Follow-up with PCP Additional Notes: Patient reports feeling fatigue daily after being sick back to back about a month ago. She still has a cough and general weakness. Patient would like to be seen to ensure everything is okay. Patient was offered a Saturday appointment but was unable to accept due to work schedule. Patient took the next available appointment in the office.    Summary: Fatigued   Pt is feeling unusually tired, she has little to no energy. She is seeking an appt to discuss options. Nothing available until weeks away  Best contact:  [phone]     Reason for Disposition  [1] Fatigue (i.e., tires easily, decreased energy) AND [2] persists > 1 week  Answer Assessment - Initial Assessment Questions 1. DESCRIPTION: "Describe how you are feeling."     Fatigue, start off the day with energy in the morning 2. SEVERITY: "How bad is it?"  "Can you stand and walk?"   - MILD (0-3): Feels weak or tired, but does not interfere with work, school or normal activities.   - MODERATE (4-7): Able to stand and walk; weakness interferes with work, school, or normal activities.   - SEVERE (8-10): Unable to stand or walk; unable to do usual activities.     Mild 3. ONSET: "When did these symptoms begin?" (e.g., hours, days, weeks, months)     About 1 month ago 4. CAUSE: "What do you think is causing the weakness or fatigue?" (e.g., not drinking enough fluids, medical problem, trouble sleeping)     When I got sick back to back a month ago 5. NEW MEDICINES:  "Have you started on any new medicines recently?" (e.g., opioid pain medicines, benzodiazepines,  muscle relaxants, antidepressants, antihistamines, neuroleptics, beta blockers)     No 6. OTHER SYMPTOMS: "Do you have any other symptoms?" (e.g., chest pain, fever, cough, SOB, vomiting, diarrhea, bleeding, other areas of pain)     Cough  7. PREGNANCY: "Is there any chance you are pregnant?" "When was your last menstrual period?"     No had tubes tied.  Protocols used: Weakness (Generalized) and Fatigue-A-AH

## 2022-08-12 ENCOUNTER — Ambulatory Visit: Payer: Managed Care, Other (non HMO) | Attending: Physician Assistant | Admitting: Physician Assistant

## 2022-08-12 ENCOUNTER — Encounter: Payer: Self-pay | Admitting: Physician Assistant

## 2022-08-12 VITALS — BP 135/87 | HR 77 | Wt 216.8 lb

## 2022-08-12 DIAGNOSIS — N951 Menopausal and female climacteric states: Secondary | ICD-10-CM | POA: Diagnosis not present

## 2022-08-12 DIAGNOSIS — R0981 Nasal congestion: Secondary | ICD-10-CM | POA: Diagnosis not present

## 2022-08-12 DIAGNOSIS — R052 Subacute cough: Secondary | ICD-10-CM

## 2022-08-12 DIAGNOSIS — R454 Irritability and anger: Secondary | ICD-10-CM

## 2022-08-12 DIAGNOSIS — R232 Flushing: Secondary | ICD-10-CM

## 2022-08-12 MED ORDER — OMEPRAZOLE 20 MG PO CPDR
20.0000 mg | DELAYED_RELEASE_CAPSULE | Freq: Every day | ORAL | 3 refills | Status: DC
Start: 2022-08-12 — End: 2022-12-14

## 2022-08-12 MED ORDER — FLUTICASONE PROPIONATE 50 MCG/ACT NA SUSP: 2.0000 | Freq: Every day | NASAL | 1 refills | Status: DC

## 2022-08-12 MED ORDER — PAROXETINE HCL 20 MG PO TABS
20.0000 mg | ORAL_TABLET | Freq: Every day | ORAL | 1 refills | Status: DC
Start: 2022-08-12 — End: 2023-02-13

## 2022-08-12 NOTE — Progress Notes (Signed)
Patient ID: Renee Collier, female   DOB: 1970-11-01, 52 y.o.   MRN: [id number]   Renee Collier, is a 52 y.o. female  [id number]  [id number]  DOB - 03/03/1970  Chief Complaint  Patient presents with   Fatigue   Cough   Sinus Problem       Subjective:   Renee Collier is a 52 y.o. female here today for several issues.  Hot flashes for about 6 months and irritability.  +sleep disturbance.  She had hysterectomy but still has 1 ovary.  Non smoker.    Also, she has been on antibiotics twice in the the last 2 months and still has sinus congestion and cough.  Mucus is clear to light yellow.  Cough is worse at night.  She does have some acid reflux.  Had CXR was WNL.    No problems updated.  ALLERGIES: No Known Allergies  PAST MEDICAL HISTORY: Past Medical History:  Diagnosis Date   Arthritis    hips   Hypertension     MEDICATIONS AT HOME: Prior to Admission medications   Medication Sig Start Date End Date Taking? Authorizing Provider  albuterol (VENTOLIN HFA) 108 (90 Base) MCG/ACT inhaler Inhale 2 puffs into the lungs every 6 (six) hours as needed for wheezing or shortness of breath. 06/10/22  Yes Hoy Register, MD  chlorpheniramine-HYDROcodone (TUSSIONEX) 10-8 MG/5ML Take 5 mLs by mouth every 12 (twelve) hours as needed for cough. 06/10/22  Yes Hoy Register, MD  doxycycline (VIBRA-TABS) 100 MG tablet Take 1 tablet (100 mg total) by mouth 2 (two) times daily. 07/07/22  Yes Hawks, Christy A, FNP  lisinopril-hydrochlorothiazide (ZESTORETIC) 20-25 MG tablet Take 1 tablet by mouth daily. 04/07/22  Yes Hoy Register, MD  omeprazole (PRILOSEC) 20 MG capsule Take 1 capsule (20 mg total) by mouth daily. 08/12/22  Yes Georgian Co M, PA-C  PARoxetine (PAXIL) 20 MG tablet Take 1 tablet (20 mg total) by mouth daily. 08/12/22  Yes Georgian Co M, PA-C  cyclobenzaprine (FLEXERIL) 10 MG tablet Take 1 tablet (10 mg total) by mouth 2 (two) times daily as needed for muscle spasms. 02/08/22    Hoy Register, MD  fluticasone (FLONASE) 50 MCG/ACT nasal spray Place 2 sprays into both nostrils daily. 08/12/22   Brysan Mcevoy, Marzella Schlein, PA-C    ROS: Neg cardiac Neg GI Neg GU Neg MS Neg psych Neg neuro  Objective:   Vitals:   08/12/22 1002  BP: 135/87  Pulse: 77  SpO2: 99%  Weight: 216 lb 12.8 oz (98.3 kg)   Exam General appearance : Awake, alert, not in any distress. Speech Clear. Not toxic looking HEENT: Atraumatic and Normocephalic.  Nasal turbinates congested and boggy.  B TM congested without infection.   Neck: Supple, no JVD. No cervical lymphadenopathy.  Chest: Good air entry bilaterally, CTAB.  No rales/rhonchi/wheezing CVS: S1 S2 regular, no murmurs.  Extremities: B/L Lower Ext shows no edema, both legs are warm to touch Neurology: Awake alert, and oriented X 3, CN II-XII intact, Non focal Skin: No Rash  Data Review Lab Results  Component Value Date   HGBA1C 5.6 02/10/2022   HGBA1C 5.8 03/31/2021   HGBA1C 5.9 (H) 01/01/2020    Assessment & Plan   1. Perimenopausal - FSH/LH - Thyroid Panel With TSH - Ambulatory referral to Gynecology  2. Hot flashes - FSH/LH - Thyroid Panel With TSH - Ambulatory referral to Gynecology  3. Congestion of paranasal sinus - fluticasone (FLONASE) 50 MCG/ACT nasal spray; Place 2 sprays into both  nostrils daily.  Dispense: 16 g; Refill: 1 - Ambulatory referral to ENT  4. Subacute cough ?  Caused by reflux - omeprazole (PRILOSEC) 20 MG capsule; Take 1 capsule (20 mg total) by mouth daily.  Dispense: 30 capsule; Refill: 3  5. Irritability Related to menopause.  Will increase dose of paxil.  And refer for eval of HRT options - PARoxetine (PAXIL) 20 MG tablet; Take 1 tablet (20 mg total) by mouth daily.  Dispense: 90 tablet; Refill: 1    Return if symptoms worsen or fail to improve.  The patient was given clear instructions to go to ER or return to medical center if symptoms don't improve, worsen or new problems  develop. The patient verbalized understanding. The patient was told to call to get lab results if they haven't heard anything in the next week.      Georgian Co, PA-C [facility] [place], [place] [phone]   08/12/2022, 10:36 AM

## 2022-08-12 NOTE — Patient Instructions (Signed)

## 2022-08-13 ENCOUNTER — Telehealth: Payer: Self-pay

## 2022-08-13 LAB — THYROID PANEL WITH TSH
Free Thyroxine Index: 1.6 (ref 1.2–4.9)
T3 Uptake Ratio: 25 % (ref 24–39)
T4, Total: 6.4 ug/dL (ref 4.5–12.0)
TSH: 0.933 u[IU]/mL (ref 0.450–4.500)

## 2022-08-13 LAB — FSH/LH
FSH: 55.6 m[IU]/mL
LH: 40.9 m[IU]/mL

## 2022-08-13 NOTE — Telephone Encounter (Signed)
-----   Message from Georgian Co sent at 08/13/2022  1:10 PM EDT ----- Thyroid is normal.  Hormones reflect menopause.  Try the things we discussed and see gynecology once the appt is made.  Thanks, Georgian Co, PA-C

## 2022-08-13 NOTE — Telephone Encounter (Signed)
Patient viewed results and Doctor comment through [facility]

## 2022-08-23 ENCOUNTER — Other Ambulatory Visit: Payer: Self-pay | Admitting: Family Medicine

## 2022-10-07 ENCOUNTER — Ambulatory Visit: Payer: 59 | Admitting: Family Medicine

## 2022-10-25 ENCOUNTER — Telehealth: Payer: Managed Care, Other (non HMO) | Admitting: Family Medicine

## 2022-10-25 DIAGNOSIS — M1612 Unilateral primary osteoarthritis, left hip: Secondary | ICD-10-CM

## 2022-10-25 NOTE — Progress Notes (Signed)
[place]- thought she was calling her PCP for referral needs.  Patient acknowledged agreement and understanding of the plan.

## 2022-11-04 ENCOUNTER — Encounter: Payer: Self-pay | Admitting: Sports Medicine

## 2022-11-04 ENCOUNTER — Ambulatory Visit: Payer: Managed Care, Other (non HMO) | Admitting: Sports Medicine

## 2022-11-04 ENCOUNTER — Other Ambulatory Visit: Payer: Self-pay

## 2022-11-04 DIAGNOSIS — M1612 Unilateral primary osteoarthritis, left hip: Secondary | ICD-10-CM | POA: Diagnosis not present

## 2022-11-04 DIAGNOSIS — Z6839 Body mass index (BMI) 39.0-39.9, adult: Secondary | ICD-10-CM | POA: Diagnosis not present

## 2022-11-04 DIAGNOSIS — E66812 Obesity, class 2: Secondary | ICD-10-CM

## 2022-11-04 MED ORDER — METHYLPREDNISOLONE ACETATE 40 MG/ML IJ SUSP
80.0000 mg | INTRAMUSCULAR | Status: AC | PRN
Start: 2022-11-04 — End: 2022-11-04
  Administered 2022-11-04: 80 mg via INTRA_ARTICULAR

## 2022-11-04 MED ORDER — LIDOCAINE HCL 1 % IJ SOLN
4.0000 mL | INTRAMUSCULAR | Status: AC | PRN
Start: 2022-11-04 — End: 2022-11-04
  Administered 2022-11-04: 4 mL

## 2022-11-04 NOTE — Progress Notes (Signed)
Patient says she got a lot of relief from last injection but has been having the same pain as she was prior to that injection.

## 2022-11-04 NOTE — Progress Notes (Signed)
Renee Collier - 52 y.o. female MRN [id number]  Date of birth: 1970-10-23  Office Visit Note: Visit Date: 11/04/2022 PCP: Hoy Register, MD Referred by: Hoy Register, MD  Subjective: Chief Complaint  Patient presents with   Left Hip - Pain   HPI: Renee Collier is a pleasant 52 y.o. female who presents today for chronic left hip pain with known OA.  Renee Collier presents today for acute on chronic left hip pain.  Back in early January we did perform an ultrasound-guided intra-articular hip injection, this essentially resolved her pain for many months until.  The last few weeks her pain has become exacerbated again.  No specific injury.  Pain hurts in the anterior hip and radiates into the groin.  She had done formalized physical therapy so just recently started back on some of her home exercises.  She does have meloxicam 15 mg to take as needed.  Also has used Flexeril in the past but this only makes her sleepy.  She has been working on weight loss, used to be around 230 pounds.  She has been working on less snacking and recently started walking for exercise.  Pertinent ROS were reviewed with the patient and found to be negative unless otherwise specified above in HPI.   Assessment & Plan: Visit Diagnoses:  1. Primary osteoarthritis of left hip   2. Class 2 obesity without serious comorbidity with body mass index (BMI) of 39.0 to 39.9 in adult, unspecified obesity type    Plan: Renee Collier is dealing with an exacerbation of her chronic underlying left hip osteoarthritis.  She did have excellent relief from prior intra-articular hip injection back in January which resolved her pain up until the last few weeks.  Through shared decision-making, we did repeat ultrasound-guided intra-articular hip injection, patient tolerated well.  We discussed the importance of maintaining mobility and stabilization about the hip.  She will have 48 to 72 hours of modified activity, may use ice, Tylenol or meloxicam 15  mg for pain control.  Starting this weekend I would like her to get back into her home hip exercises, perform these once daily for stability and prevention.  We also discussed weight loss and how this will help offload the hip, she will continue working on dietary changes and I did discuss performing physical activity which elevates her heart rate.  She may use meloxicam 15 mg daily as needed for pain control.  She will follow-up as needed.  At some point in the future we will likely need to repeat hip x-rays to she any degree of arthritic progression.  Follow-up: Return if symptoms worsen or fail to improve.   Meds & Orders: No orders of the defined types were placed in this encounter.   Orders Placed This Encounter  Procedures   Large Joint Inj: L hip joint   US Guided Needle Placement - No Linked Charges     Procedures: Large Joint Inj: L hip joint on 11/04/2022 8:39 AM Indications: pain Details: 22 G 3.5 in needle, ultrasound-guided anterior approach Medications: 4 mL lidocaine 1 %; 80 mg methylPREDNISolone acetate 40 MG/ML Outcome: tolerated well, no immediate complications  Procedure: US-guided intra-articular hip injection, left After discussion on risks/benefits/indications and informed verbal consent was obtained, a timeout was performed. Patient was lying supine on exam table. The hip was cleaned with betadine and alcohol swabs. Then utilizing ultrasound guidance, the patient's femoral head and neck junction was identified and subsequently injected with 4:2 lidocaine:depomedrol via an in-plane approach with ultrasound  visualization of the injectate administered into the hip joint. Patient tolerated procedure well without immediate complications.  Procedure, treatment alternatives, risks and benefits explained, specific risks discussed. Consent was given by the patient. Immediately prior to procedure a time out was called to verify the correct patient, procedure, equipment, support  staff and site/side marked as required. Patient was prepped and draped in the usual sterile fashion.          Clinical History: No specialty comments available.  She reports that she has never smoked. She has never used smokeless tobacco.  Recent Labs    02/10/22 0836  HGBA1C 5.6    Objective:   Vital Signs: LMP 06/10/2020   BMI: 39.5  Physical Exam  Gen: Well-appearing, in no acute distress; non-toxic CV:  Well-perfused. Warm.  Resp: Breathing unlabored on room air; no wheezing. Psych: Fluid speech in conversation; appropriate affect; normal thought process Neuro: Sensation intact throughout. No gross coordination deficits.   Ortho Exam - Left hip: No bony TTP, no greater trochanter TTP.  There is some restriction with pain to internal logroll, no excessive restriction with external.  Positive Stinchfield and FADIR testing.  Imaging:  *Independent review and interpretation of x-rays from 10/01/2021 were performed by myself today.  X-rays demonstrate at least moderate degenerative changes to the left hip with overhanging of the acetabular rim and some spurring over the inferior aspect of the hip joint, likely leading to hip impingement.  No acute fracture.  Past Medical/Family/Surgical/Social History: Medications & Allergies reviewed per EMR, new medications updated. Patient Active Problem List   Diagnosis Date Noted   Right carpal tunnel syndrome 10/30/2021   Left carpal tunnel syndrome 10/30/2021   Prediabetes 03/31/2021   Menometrorrhagia 06/10/2020   Primary osteoarthritis of left hip 10/18/2019   Vaginal discharge 12/14/2018   Status post HTA endometrial ablation 04/10/2014   Abnormal uterine bleeding (AUB) 12/18/2013   Past Medical History:  Diagnosis Date   Arthritis    hips   Hypertension    Family History  Problem Relation Age of Onset   Cancer Mother        cervical   Ovarian cancer Sister    Colon cancer Neg Hx    Esophageal cancer Neg Hx     Rectal cancer Neg Hx    Stomach cancer Neg Hx    Past Surgical History:  Procedure Laterality Date   DILATION AND CURETTAGE OF UTERUS N/A 04/27/2017   Procedure: Ultrasound Guided Dilatation and Curettage;  Surgeon: Allie Bossier, MD;  Location:  SURGERY CENTER;  Service: Gynecology;  Laterality: N/A;   ENDOMETRIAL ABLATION     HYSTEROSCOPY N/A 03/12/2014   Procedure: HYSTEROSCOPY WITH HYDROTHERMAL ABLATION;  Surgeon: Tereso Newcomer, MD;  Location: [facility];  Service: Gynecology;  Laterality: N/A;   TUBAL LIGATION     VAGINAL HYSTERECTOMY Left 06/10/2020   Procedure: HYSTERECTOMY VAGINAL with LEFT Salpingectomy;  Surgeon: Adam Phenix, MD;  [facility];  Service: Gynecology;  Laterality: Left;   Social History   Occupational History   Not on file  Tobacco Use   Smoking status: Never   Smokeless tobacco: Never  Vaping Use   Vaping status: Never Used  Substance and Sexual Activity   Alcohol use: Yes    Comment: occ   Drug use: No   Sexual activity: Not Currently    Birth control/protection: Surgical

## 2022-11-30 ENCOUNTER — Encounter: Payer: Managed Care, Other (non HMO) | Admitting: Student

## 2022-11-30 NOTE — Progress Notes (Deleted)
  History:  Ms. Janitza Overby is a 52 y.o. [phone] who presents to clinic today for ***   Patient had vaginal hyst in 2022 for AUB and fibroids.   The following portions of the patient's history were reviewed and updated as appropriate: allergies, current medications, family history, past medical history, social history, past surgical history and problem list.  Review of Systems:  ROS    Objective:  Physical Exam LMP 06/10/2020  Physical Exam    Labs and Imaging No results found for this or any previous visit (from the past 24 hour(s)).  No results found.  Health Maintenance Due  Topic Date Due   INFLUENZA VACCINE  09/02/2022   COVID-19 Vaccine (1 - 2023-24 season) Never done   Zoster Vaccines- Shingrix (1 of 2) Never done    Labs, imaging and previous visits in Epic and Care Everywhere reviewed  Assessment & Plan:  1. Postmenopausal ***    Approximately *** minutes of total time was spent with this patient on ***  No follow-ups on file.  Corlis Hove, NP 11/30/2022 1:07 PM

## 2022-12-13 ENCOUNTER — Other Ambulatory Visit: Payer: Self-pay | Admitting: Physician Assistant

## 2022-12-13 DIAGNOSIS — R052 Subacute cough: Secondary | ICD-10-CM

## 2022-12-14 ENCOUNTER — Encounter: Payer: Self-pay | Admitting: Family Medicine

## 2022-12-14 ENCOUNTER — Ambulatory Visit: Payer: Managed Care, Other (non HMO) | Attending: Family Medicine | Admitting: Family Medicine

## 2022-12-14 ENCOUNTER — Telehealth: Payer: Self-pay | Admitting: Family Medicine

## 2022-12-14 VITALS — BP 153/102 | HR 83 | Ht 63.0 in | Wt 217.6 lb

## 2022-12-14 DIAGNOSIS — I10 Essential (primary) hypertension: Secondary | ICD-10-CM | POA: Diagnosis not present

## 2022-12-14 DIAGNOSIS — E8940 Asymptomatic postprocedural ovarian failure: Secondary | ICD-10-CM

## 2022-12-14 DIAGNOSIS — M5432 Sciatica, left side: Secondary | ICD-10-CM

## 2022-12-14 DIAGNOSIS — Z23 Encounter for immunization: Secondary | ICD-10-CM | POA: Diagnosis not present

## 2022-12-14 DIAGNOSIS — R454 Irritability and anger: Secondary | ICD-10-CM

## 2022-12-14 DIAGNOSIS — Z90710 Acquired absence of both cervix and uterus: Secondary | ICD-10-CM

## 2022-12-14 MED ORDER — ESTRADIOL 0.025 MG/24HR TD PTWK
0.0250 mg | MEDICATED_PATCH | TRANSDERMAL | 12 refills | Status: DC
Start: 2022-12-14 — End: 2023-03-23

## 2022-12-14 MED ORDER — CYCLOBENZAPRINE HCL 10 MG PO TABS
10.0000 mg | ORAL_TABLET | Freq: Every evening | ORAL | 2 refills | Status: DC | PRN
Start: 2022-12-14 — End: 2023-03-23

## 2022-12-14 MED ORDER — VALSARTAN-HYDROCHLOROTHIAZIDE 320-25 MG PO TABS
1.0000 | ORAL_TABLET | Freq: Every day | ORAL | 1 refills | Status: DC
Start: 2022-12-14 — End: 2023-03-23

## 2022-12-14 NOTE — Telephone Encounter (Signed)
Please evaluate for counseling.  Complains of irritability.  Currently on paroxetine.  Thank you.

## 2022-12-14 NOTE — Telephone Encounter (Signed)
Requested medication (s) are due for refill today:yes  Requested medication (s) are on the active medication list: yes  Last refill:  08/12/22  #30 3 RF  Future visit scheduled: today   Notes to clinic:  pt has appt today   Requested Prescriptions  Pending Prescriptions Disp Refills   omeprazole (PRILOSEC) 20 MG capsule [Pharmacy Med Name: OMEPRAZOLE DR 20 MG CAPSULE] 30 capsule 3    Sig: TAKE 1 CAPSULE BY MOUTH DAILY     Gastroenterology: Proton Pump Inhibitors Passed - 12/13/2022  6:11 AM      Passed - Valid encounter within last 12 months    Recent Outpatient Visits           Today    [facility] [place]. [facility] Hoy Register, MD   4 months ago Perimenopausal   [facility] [place]. [facility], Marzella Schlein, [place]   6 months ago Community acquired pneumonia, unspecified laterality   [facility] [place]. [facility] Hoy Register, MD   8 months ago Annual physical exam   [facility] [place]. [facility] Hoy Register, MD   10 months ago Acute non-recurrent maxillary sinusitis   [facility] [place]. [facility] Hoy Register, MD

## 2022-12-14 NOTE — Patient Instructions (Signed)
VISIT SUMMARY:  During today's visit, we discussed your high blood pressure, worsening menopausal symptoms, lower back pain, and mental health concerns. We have made some adjustments to your medications and have planned follow-up actions to help manage your conditions.  YOUR PLAN:  -HYPERTENSION: Hypertension means high blood pressure, which can lead to serious health problems if not managed. We have switched your medication from Lisinopril Hydrochlorothiazide to Diovan Hydrochlorothiazide. Please check your blood pressure in one month to see how the new medication is working.  -MENOPAUSAL SYMPTOMS: Menopausal symptoms include irritability, hot flashes, and sleep disturbances due to hormonal changes. Since Paroxetine is not helping, we are starting you on an estrogen patch to be applied once weekly. We will reassess your symptoms in one month.  -LOWER BACK PAIN: Lower back pain can be due to physical activity, Sciatica or arthritis. You have had sciatica in the past. To help manage the pain, we are prescribing Flexeril, a muscle relaxant, for you to use at night.  -MENTAL HEALTH: Your increased irritability and frustration may be related to menopause. We are referring you to a social worker, Toni Amend, for therapy to help manage these feelings.  -GENERAL HEALTH MAINTENANCE: We are ordering blood work to check your kidney and liver function as part of your routine health maintenance.  INSTRUCTIONS:  Please check your blood pressure in one month to assess the response to the new medication. We will also reassess your menopausal symptoms in one month. Additionally, follow up with the social worker, Toni Amend, for therapy. Ensure you complete the blood work to check your kidney and liver function as ordered.

## 2022-12-14 NOTE — Progress Notes (Signed)
Subjective:  Patient ID: Renee Collier, female    DOB: October 26, 1970  Age: 52 y.o. MRN: [id number]  CC: Medical Management of Chronic Issues (Hot flashes/Mood Swings/Trouble sleeping)   HPI Renee Collier is a 52 y.o. year old female with a history of hypertension.  Interval History: Discussed the use of AI scribe software for clinical note transcription with the patient, who gave verbal consent to proceed.  She presents with high blood pressure and worsening menopausal symptoms. She reports increased irritability and frustration, particularly at home, and difficulty sleeping due to heightened sensitivity to noise. Despite taking paroxetine, she has not noticed an improvement in her symptoms, and in fact, feels she may be worsening. She also reports persistent hot flashes, to the point of needing to sleep without clothes.  She is status post hysterectomy.  In addition to these symptoms, the patient has been experiencing intermittent pain in her lower back, which she attributes to recent exercise. She denies any radiation of the pain. She has been managing the pain with Tylenol and has previously received a cortisone shot. She also reports difficulty losing weight despite efforts to exercise and dietary changes implemented by her family.        Past Medical History:  Diagnosis Date   Arthritis    hips   Hypertension     Past Surgical History:  Procedure Laterality Date   DILATION AND CURETTAGE OF UTERUS N/A 04/27/2017   Procedure: Ultrasound Guided Dilatation and Curettage;  Surgeon: Allie Bossier, MD;  Location: [facility];  Service: Gynecology;  Laterality: N/A;   ENDOMETRIAL ABLATION     HYSTEROSCOPY N/A 03/12/2014   Procedure: HYSTEROSCOPY WITH HYDROTHERMAL ABLATION;  Surgeon: Tereso Newcomer, MD;  Location: [facility];  Service: Gynecology;  Laterality: N/A;   TUBAL LIGATION     VAGINAL HYSTERECTOMY Left 06/10/2020   Procedure: HYSTERECTOMY VAGINAL with LEFT  Salpingectomy;  Surgeon: Adam Phenix, MD;  Location: [facility];  Service: Gynecology;  Laterality: Left;    Family History  Problem Relation Age of Onset   Cancer Mother        cervical   Ovarian cancer Sister    Colon cancer Neg Hx    Esophageal cancer Neg Hx    Rectal cancer Neg Hx    Stomach cancer Neg Hx     Social History   Socioeconomic History   Marital status: Single    Spouse name: Not on file   Number of children: Not on file   Years of education: Not on file   Highest education level: 12th grade  Occupational History   Not on file  Tobacco Use   Smoking status: Never   Smokeless tobacco: Never  Vaping Use   Vaping status: Never Used  Substance and Sexual Activity   Alcohol use: Yes    Comment: occ   Drug use: No   Sexual activity: Not Currently    Birth control/protection: Surgical  Other Topics Concern   Not on file  Social History Narrative   Not on file   Social Determinants of Health   Financial Resource Strain: Not on file  Food Insecurity: Low Risk  (09/14/2022)   Received from [facility]   Hunger Vital Sign    Worried About Running Out of Food in the Last Year: Never true    Ran Out of Food in the Last Year: Never true  Transportation Needs: Not on file (09/14/2022)  Physical Activity: Not on file  Stress: Not  on file  Social Connections: Not on file    No Known Allergies  Outpatient Medications Prior to Visit  Medication Sig Dispense Refill   PARoxetine (PAXIL) 20 MG tablet Take 1 tablet (20 mg total) by mouth daily. 90 tablet 1   lisinopril-hydrochlorothiazide (ZESTORETIC) 20-25 MG tablet Take 1 tablet by mouth daily. 90 tablet 1   albuterol (VENTOLIN HFA) 108 (90 Base) MCG/ACT inhaler Inhale 2 puffs into the lungs every 6 (six) hours as needed for wheezing or shortness of breath. (Patient not taking: Reported on 12/14/2022) 8 g 2   chlorpheniramine-HYDROcodone (TUSSIONEX) 10-8 MG/5ML Take 5 mLs by mouth every 12 (twelve) hours as  needed for cough. (Patient not taking: Reported on 12/14/2022) 115 mL 0   cyclobenzaprine (FLEXERIL) 10 MG tablet Take 1 tablet (10 mg total) by mouth 2 (two) times daily as needed for muscle spasms. (Patient not taking: Reported on 12/14/2022) 60 tablet 2   doxycycline (VIBRA-TABS) 100 MG tablet Take 1 tablet (100 mg total) by mouth 2 (two) times daily. (Patient not taking: Reported on 12/14/2022) 20 tablet 0   fluticasone (FLONASE) 50 MCG/ACT nasal spray Place 2 sprays into both nostrils daily. (Patient not taking: Reported on 12/14/2022) 16 g 1   omeprazole (PRILOSEC) 20 MG capsule Take 1 capsule (20 mg total) by mouth daily. (Patient not taking: Reported on 12/14/2022) 30 capsule 3   No facility-administered medications prior to visit.     ROS Review of Systems  Constitutional:  Negative for activity change and appetite change.  HENT:  Negative for sinus pressure and sore throat.   Respiratory:  Negative for chest tightness, shortness of breath and wheezing.   Cardiovascular:  Negative for chest pain and palpitations.  Gastrointestinal:  Negative for abdominal distention, abdominal pain and constipation.  Genitourinary: Negative.   Musculoskeletal:  Positive for back pain.  Psychiatric/Behavioral:  Negative for behavioral problems and dysphoric mood.     Objective:  BP (!) 153/102   Pulse 83   Ht 5\' 3"  (1.6 m)   Wt 217 lb 9.6 oz (98.7 kg)   LMP 06/10/2020   SpO2 100%   BMI 38.55 kg/m      12/14/2022    2:04 PM 12/14/2022    1:46 PM 08/12/2022   10:02 AM  BP/Weight  Systolic BP 153 160 135  Diastolic BP 102 105 87  Wt. (Lbs)  217.6 216.8  BMI  38.55 kg/m2 38.4 kg/m2      Physical Exam Constitutional:      Appearance: She is well-developed.  Cardiovascular:     Rate and Rhythm: Normal rate.     Heart sounds: Normal heart sounds. No murmur heard. Pulmonary:     Effort: Pulmonary effort is normal.     Breath sounds: Normal breath sounds. No wheezing or rales.   Chest:     Chest wall: No tenderness.  Abdominal:     General: Bowel sounds are normal. There is no distension.     Palpations: Abdomen is soft. There is no mass.     Tenderness: There is no abdominal tenderness.  Musculoskeletal:        General: Normal range of motion.     Right lower leg: No edema.     Left lower leg: No edema.     Comments: Negative straight leg raise  Neurological:     Mental Status: She is alert and oriented to person, place, and time.  Psychiatric:        Mood and Affect:  Mood normal.        Latest Ref Rng & Units 02/10/2022    8:36 AM 09/23/2021    4:01 PM 03/31/2021    9:43 AM  CMP  Glucose 70 - 99 mg/dL 96  433  98   BUN 6 - 24 mg/dL 8  11  9    Creatinine 0.57 - 1.00 mg/dL 2.95  1.88  4.16   Sodium 134 - 144 mmol/L 140  141  138   Potassium 3.5 - 5.2 mmol/L 4.2  4.0  4.2   Chloride 96 - 106 mmol/L 103  100  106   CO2 20 - 29 mmol/L 22  23  24    Calcium 8.7 - 10.2 mg/dL 8.8  60.6  9.6   Total Protein 6.0 - 8.5 g/dL 6.9   7.4   Total Bilirubin 0.0 - 1.2 mg/dL 0.2   0.3   Alkaline Phos 44 - 121 IU/L 97   113   AST 0 - 40 IU/L 13   8   ALT 0 - 32 IU/L 7   6     Lipid Panel     Component Value Date/Time   CHOL 143 02/10/2022 0836   TRIG 80 02/10/2022 0836   HDL 49 02/10/2022 0836   CHOLHDL 3.2 01/01/2020 1414   CHOLHDL 2.4 Ratio 11/23/2006 2033   VLDL 20 11/23/2006 2033   LDLCALC 78 02/10/2022 0836    CBC    Component Value Date/Time   WBC 5.6 02/10/2022 0836   WBC 6.3 02/13/2021 1100   RBC 4.46 02/10/2022 0836   RBC 4.43 02/13/2021 1100   HGB 13.4 02/10/2022 0836   HCT 40.5 02/10/2022 0836   PLT 327 02/10/2022 0836   MCV 91 02/10/2022 0836   MCH 30.0 02/10/2022 0836   MCH 30.0 02/13/2021 1100   MCHC 33.1 02/10/2022 0836   MCHC 33.0 02/13/2021 1100   RDW 13.7 02/10/2022 0836   LYMPHSABS 3.6 (H) 02/10/2022 0836   MONOABS 0.6 01/05/2021 1754   EOSABS 0.1 02/10/2022 0836   BASOSABS 0.0 02/10/2022 0836    Lab Results  Component  Value Date   HGBA1C 5.6 02/10/2022    Assessment & Plan:      Hypertension Elevated blood pressure noted during visit. Switched from Lisinopril/Hydrochlorothiazide to Diovan/Hydrochlorothiazide. -Check blood pressure in one month to assess response to new medication. -Counseled on blood pressure goal of less than 130/80, low-sodium, DASH diet, medication compliance, 150 minutes of moderate intensity exercise per week. Discussed medication compliance, adverse effects.   Menopausal Symptoms secondary to hysterectomy Patient experiencing irritability, hot flashes, and sleep disturbances. Current treatment with Paroxetine not providing sufficient relief. -Start estrogen patch, apply once weekly. -Irritability also present and she will benefit from counseling -Reassess symptoms in one month.  Lower Back Pain History of sciatica Patient reports intermittent lower back pain, possibly related to physical activity or arthritis. -Prescribe Flexeril (muscle relaxant) for use at night to manage pain.  Irritability Patient reports increased irritability and frustration, possibly related to menopause. Expressed interest in therapy. -Refer to Child psychotherapist, Toni Amend, for therapy.  General Health Maintenance -Order blood work to check kidney and liver function.          Meds ordered this encounter  Medications   cyclobenzaprine (FLEXERIL) 10 MG tablet    Sig: Take 1 tablet (10 mg total) by mouth at bedtime as needed for muscle spasms.    Dispense:  30 tablet    Refill:  2   estradiol (  CLIMARA - DOSED IN MG/24 HR) 0.025 mg/24hr patch    Sig: Place 1 patch (0.025 mg total) onto the skin once a week.    Dispense:  4 patch    Refill:  12   valsartan-hydrochlorothiazide (DIOVAN-HCT) 320-25 MG tablet    Sig: Take 1 tablet by mouth daily.    Dispense:  90 tablet    Refill:  1    Discontinue lisinopril/HCTZ    Follow-up: Return in about 1 month (around 01/13/2023) for Blood Pressure  follow-up with PCP.       Hoy Register, MD, FAAFP. [facility] [place], [place] [phone]   12/14/2022, 2:44 PM

## 2022-12-15 LAB — CMP14+EGFR
ALT: 11 [IU]/L (ref 0–32)
AST: 14 [IU]/L (ref 0–40)
Albumin: 4.1 g/dL (ref 3.9–4.9)
Alkaline Phosphatase: 134 [IU]/L — ABNORMAL HIGH (ref 44–121)
BUN/Creatinine Ratio: 11 (ref 9–23)
BUN: 9 mg/dL (ref 6–24)
Bilirubin Total: 0.2 mg/dL (ref 0.0–1.2)
CO2: 24 mmol/L (ref 20–29)
Calcium: 9.7 mg/dL (ref 8.7–10.2)
Chloride: 101 mmol/L (ref 96–106)
Creatinine, Ser: 0.83 mg/dL (ref 0.57–1.00)
Globulin, Total: 3.1 g/dL (ref 1.5–4.5)
Glucose: 100 mg/dL — ABNORMAL HIGH (ref 70–99)
Potassium: 4.1 mmol/L (ref 3.5–5.2)
Sodium: 141 mmol/L (ref 134–144)
Total Protein: 7.2 g/dL (ref 6.0–8.5)
eGFR: 86 mL/min/{1.73_m2} (ref >59)

## 2022-12-23 ENCOUNTER — Telehealth: Payer: Self-pay | Admitting: Licensed Clinical Social Worker

## 2022-12-23 NOTE — Telephone Encounter (Signed)
LCSWA called patient today to introduce herself and to assess patients' mental health needs. Patient did not answer the phone. LCSWA was able to leave a brief message with the patient asking them to return the call. Patient was referred by PCP for irritability.    Counseling Resources   [email or web address]  [facility] [address], [place], [place] [address] [phone] or [phone] Walk-in urgent care 24/7 for anyone  For [facility] ONLY New patient assessments and therapy walk-ins: Monday and Wednesday 8am-11am First and second Friday 1pm-5pm New patient psychiatry and medication management walk-ins: Mondays, Wednesdays, Thursdays, Fridays 8am-11am No psychiatry walk-ins on Tuesday   *Accepts all insurance and uninsured for Urgent Care needs *Accepts Medicaid and uninsured for outpatient treatment   [facility] (Therapy and psychiatry) [facility] (near [employer]) [address] [place], [place] [address] [phone] Fax: [phone] (INSURANCE REQUIRED-MEDICAID ACCEPTED)   [facility] Address: Four Locations  -[address] [place], [place], [place]                                 Phone: [phone] -[address], [place], [place]         Phone: [phone] -[address] [place], [place]                      Phone: [phone] -[address], [place], [place]         Phone: [phone] Age Range: Children, Adolescents, and Adults Specialty Areas: Depression, Anxiety, ADHD, Substance Abuse, Bipolar Disorder, etc.  [facility] Address: [address] [place], [place] Phone: [phone] Age Range: Children, Adults, and Elders Specialty Areas: Couple, Family, Group, Individual     [facility] Address: [address] [place], [place] Phone:  [phone] Age Range: Children, Adolescents, and Adults Specialty Areas: Individual, Family and Couples Therapy, and Substance Abuse  [facility] Address: [address] [place], [place] [address] Phone: [phone] Age Range: Adolescents/Teenagers and Adults Specialty Areas: Individual, Family, Couples, and Group Counseling   [facility] Address: [address] [place], [place] Phone: [phone] Age Range: Children and Adults Specialty Areas: Individual, Group, and Family counseling to people who have experienced domestic violence or sexual assault  [facility] Address: [address] [place], [place] [address] Phone: [phone] Age Range: Adults & Children (Ages 3+) Specialty Areas: Mental Health and Substance Abuse Counseling Services  [facility] Address: [address] [place], [place], [place] [address] Phone: [phone]             Age Range: All Ages  Specialty Areas: Common mental health diagnoses such as Anxiety, Depression, ADD/ADHD, Bipolar, and PTSD, Substance Abuse Evaluations and Counseling   [facility] at [facility] [address] [place] [address] [place],  [place]  [address] [phone] Call for appointment  [facility] [place] (Therapy only)  [facility] [address] [place], [place] [address] Monday - Friday: 8:30 a.m.-12 p.m. / 1 p.m.-2:30 p.m.  [facility] [address] [place], [place] [address] Monday-Friday: 8:30 a.m.-12 p.m. / 2-3:30 p.m. (INSURANCE REQUIRED -MEDICAID ACCEPTED) They do offer a sliding fee scale $20-$30/session   [facility] [address] [place], [place] [address] Phone: [phone]  [facility] [address] [place] [address]  Phone: [phone] (Does not accept Medicaid) (only one provider accepts Medicare)  [facility] [address] (at  [employer], [place] [address] (Accepts Medicaid and Medicare)  [facility]) [address] [place] [address]  [place], [place] [address]  Phone: [phone]  [address] [place], [place] [address] Phone: [phone] [facility] Medicaid) [facility] (Therapy only)  [address], [place], [place] [address] Phone: [phone]   [facility] (Therapy only)  [address] [place], [place] [address] Phone: [phone] [facility] & Medicare)   [facility] [address], Suite [place], [place] [address] Phone: [phone] (Accepts [place] Health Choice) [facility] [phone] [address] [place], [place] [address] Phone: [phone] [facility]) [facility] (Psychiatry only)  [phone] [address], [place], [place] [address] (Accepts Medicaid and Medicare) [facility] (Psychiatry and therapy)  [address] [place], [place] [address] [phone] [facility] Medicare) [facility] (psychiatry and therapy) [address], [place], [place] [address] [phone]  [facility] at [address], [place], [place] [address] [phone] Accept [address], [place], [place], [place], [place],  and the following types of Medicaid; Alliance, [place], Partners, [place], [place] Health Choice, [employer], Healthy [place], [place], and [place], as well as offering a Careers adviser and private payment options. Provides In-Office Appointments, Virtual Appointments, and Phone Consultations Offers medication management for ages 60 years old and up, including,  Medication Management for Suboxone and [facility] [phone] [address], [place], [place] [address] (Accepts Medicaid and Medicare)         19.  [facility] (therapy only)  [address]  [place], [place] [address]            [phone] (Accepts medicare) 20. [facility]  (Suboxone and methodone) [phone] [address], [place], [place] [address] To Be Eligible for Opioid Treatment at [facility] you must be at least 50 years of age you have already tried other interventions that were not successful such as opioid detox, inpatient rehab for opioids, or outpatient counseling specifically for opioid dependency your [facility] drug test must be completely free of benzodiazepines (klonopin, xanax, valium, ativan, or other benz) you have reliable transportation to the [facility] clinic in [place] you recognize that counseling is a critical component of [facility]' Opioid Program and you agree to attend all required counseling sessions you are committed to total drug abstinence and will conscientiously strive to remain free of alcohol, marijuana, and other illicit substances while in treatment you desire a peaceful treatment atmosphere in which personal responsibility and respect toward staff and clients is the norm   21. [facility] [address] [place], [place], [place] [address] Offers [facility] (Substance Abuse Intensive Outpatient Program) [phone] 22. [facility] [address] [place], [place] [address] [phone] (Accepts medicare)  For those who are tech savvy, go on psychology today, type in your local city (i.e. [employer]) and specify your insurance at the top of the screen after you search. (Medicaid if needed). You can also specify whether you are interested in therapy and psychiatry.  www.psychologytoday.com/us

## 2023-01-06 ENCOUNTER — Encounter: Payer: Self-pay | Admitting: Physician Assistant

## 2023-01-06 ENCOUNTER — Other Ambulatory Visit (INDEPENDENT_AMBULATORY_CARE_PROVIDER_SITE_OTHER): Payer: Self-pay

## 2023-01-06 ENCOUNTER — Ambulatory Visit (INDEPENDENT_AMBULATORY_CARE_PROVIDER_SITE_OTHER): Payer: Managed Care, Other (non HMO) | Admitting: Orthopaedic Surgery

## 2023-01-06 VITALS — Ht 64.0 in | Wt 216.0 lb

## 2023-01-06 DIAGNOSIS — M1612 Unilateral primary osteoarthritis, left hip: Secondary | ICD-10-CM

## 2023-01-06 MED ORDER — TRAMADOL HCL 50 MG PO TABS: 50.0000 mg | ORAL_TABLET | Freq: Two times a day (BID) | ORAL | 2 refills | Status: DC | PRN

## 2023-01-06 NOTE — Progress Notes (Signed)
Office Visit Note   Patient: Renee Collier           Date of Birth: 1970-07-09           MRN: [id number] Visit Date: 01/06/2023              Requested by: Hoy Register, MD [address] [place],  [place] [address] PCP: Hoy Register, MD   Assessment & Plan: Visit Diagnoses:  1. Unilateral primary osteoarthritis, left hip     Plan: Impression is left hip osteoarthritis.  Today, we discussed various treatment options to include repeat cortisone injection next month versus total hip arthroplasty.  She would like to proceed with total hip replacement.  She we will need to wait until after February 05, 2023 as she underwent cortisone injection to the left hip joint 11/05/2022.  She understands and agrees.  Impression is severe left hip degenerative joint disease secondary to Osteoarthritis.  Imaging shows bone on bone joint space narrowing.  At this point, conservative treatments fail to provide any significant relief and the pain is severely affecting ADLs and quality of life.  Based on treatment options, the patient has elected to move forward with a hip replacement.  We have discussed the surgical risks that include but are not limited to infection, DVT, leg length discrepancy, numbness, tingling, incomplete relief of pain.  Recovery and prognosis were also reviewed.    Current anticoagulants: No antithrombotic Postop anticoagulation: Aspirin 81 mg Diabetic: No  Prior DVT/PE: No Tobacco use: No Clearances needed for surgery: Hoy Register Anticipate discharge dispo: home   Follow-Up Instructions: Return for po.   Orders:  Orders Placed This Encounter  Procedures   XR HIP UNILAT W OR W/O PELVIS 2-3 VIEWS LEFT   No orders of the defined types were placed in this encounter.     Procedures: No procedures performed   Clinical Data: No additional findings.   Subjective: Chief Complaint  Patient presents with   Left Hip - Pain    HPI patient is a  pleasant 52 year old female who comes in today with recurrent left hip pain.  History of osteoarthritis.  She has undergone multiple cortisone injection to the left hip joint with the last one being on 11/05/2022.  She had a little less than 2 months relief following this injection.  She continues to have groin pain that is now constant.  Worse over the past few days.  She has increased pain when she rest after she has been standing on her feet all day at work.  She has been taking ibuprofen and muscle relaxers without relief.  Review of Systems as detailed in HPI.  All others reviewed and are negative.   Objective: Vital Signs: LMP 06/10/2020   Physical Exam well-developed well-nourished female in no acute distress.  Alert oriented x 3.  Ortho Exam left hip exam: Pain with logroll, FADIR and Stinchfield testing.  She is neurovascularly intact distally.  Specialty Comments:  No specialty comments available.  Imaging: XR HIP UNILAT W OR W/O PELVIS 2-3 VIEWS LEFT  Result Date: 01/06/2023 X-rays demonstrate advanced degenerative changes with peritubular osteophyte formation    PMFS History: Patient Active Problem List   Diagnosis Date Noted   Right carpal tunnel syndrome 10/30/2021   Left carpal tunnel syndrome 10/30/2021   Prediabetes 03/31/2021   Menometrorrhagia 06/10/2020   Primary osteoarthritis of left hip 10/18/2019   Vaginal discharge 12/14/2018   Status post HTA endometrial ablation 04/10/2014   Abnormal  uterine bleeding (AUB) 12/18/2013   Past Medical History:  Diagnosis Date   Arthritis    hips   Hypertension     Family History  Problem Relation Age of Onset   Cancer Mother        cervical   Ovarian cancer Sister    Colon cancer Neg Hx    Esophageal cancer Neg Hx    Rectal cancer Neg Hx    Stomach cancer Neg Hx     Past Surgical History:  Procedure Laterality Date   DILATION AND CURETTAGE OF UTERUS N/A 04/27/2017   Procedure: Ultrasound Guided Dilatation and  Curettage;  Surgeon: Allie Bossier, MD;  Location: [facility];  Service: Gynecology;  Laterality: N/A;   ENDOMETRIAL ABLATION     HYSTEROSCOPY N/A 03/12/2014   Procedure: HYSTEROSCOPY WITH HYDROTHERMAL ABLATION;  Surgeon: Tereso Newcomer, MD;  Location: [facility];  Service: Gynecology;  Laterality: N/A;   TUBAL LIGATION     VAGINAL HYSTERECTOMY Left 06/10/2020   Procedure: HYSTERECTOMY VAGINAL with LEFT Salpingectomy;  Surgeon: Adam Phenix, MD;  Location[facility];  Service: Gynecology;  Laterality: Left;   Social History   Occupational History   Not on file  Tobacco Use   Smoking status: Never   Smokeless tobacco: Never  Vaping Use   Vaping status: Never Used  Substance and Sexual Activity   Alcohol use: Yes    Comment: occ   Drug use: No   Sexual activity: Not Currently    Birth control/protection: Surgical

## 2023-01-18 ENCOUNTER — Ambulatory Visit: Payer: Managed Care, Other (non HMO) | Admitting: Family Medicine

## 2023-02-09 ENCOUNTER — Other Ambulatory Visit: Payer: Self-pay | Admitting: Family Medicine

## 2023-02-09 DIAGNOSIS — Z1231 Encounter for screening mammogram for malignant neoplasm of breast: Secondary | ICD-10-CM

## 2023-02-11 ENCOUNTER — Other Ambulatory Visit: Payer: Self-pay | Admitting: Physician Assistant

## 2023-02-11 DIAGNOSIS — R454 Irritability and anger: Secondary | ICD-10-CM

## 2023-03-15 NOTE — Progress Notes (Unsigned)
   Office Visit Note   Patient: Renee Collier           Date of Birth: Dec 06, 1970           MRN: [id number] Visit Date: 03/16/2023              Requested by: Hoy Register, MD [address] [place],  [place] [address] PCP: Hoy Register, MD   Assessment & Plan: Visit Diagnoses: No diagnosis found.  Plan: ***  Follow-Up Instructions: No follow-ups on file.   Orders:  No orders of the defined types were placed in this encounter.  No orders of the defined types were placed in this encounter.     Procedures: No procedures performed   Clinical Data: No additional findings.   Subjective: No chief complaint on file.   HPI  Review of Systems   Objective: Vital Signs: LMP 06/10/2020   Physical Exam  Ortho Exam  Specialty Comments:  No specialty comments available.  Imaging: No results found.   PMFS History: Patient Active Problem List   Diagnosis Date Noted   Right carpal tunnel syndrome 10/30/2021   Left carpal tunnel syndrome 10/30/2021   Prediabetes 03/31/2021   Menometrorrhagia 06/10/2020   Primary osteoarthritis of left hip 10/18/2019   Vaginal discharge 12/14/2018   Status post HTA endometrial ablation 04/10/2014   Abnormal uterine bleeding (AUB) 12/18/2013   Past Medical History:  Diagnosis Date   Arthritis    hips   Hypertension     Family History  Problem Relation Age of Onset   Cancer Mother        cervical   Ovarian cancer Sister    Colon cancer Neg Hx    Esophageal cancer Neg Hx    Rectal cancer Neg Hx    Stomach cancer Neg Hx     Past Surgical History:  Procedure Laterality Date   DILATION AND CURETTAGE OF UTERUS N/A 04/27/2017   Procedure: Ultrasound Guided Dilatation and Curettage;  Surgeon: Allie Bossier, MD;  Location: [facility];  Service: Gynecology;  Laterality: N/A;   ENDOMETRIAL ABLATION     HYSTEROSCOPY N/A 03/12/2014   Procedure: HYSTEROSCOPY WITH HYDROTHERMAL ABLATION;  Surgeon:  Tereso Newcomer, MD;  Location: [facility];  Service: Gynecology;  Laterality: N/A;   TUBAL LIGATION     VAGINAL HYSTERECTOMY Left 06/10/2020   Procedure: HYSTERECTOMY VAGINAL with LEFT Salpingectomy;  Surgeon: Adam Phenix, MD;  Location: [facility] OR;  Service: Gynecology;  Laterality: Left;   Social History   Occupational History   Not on file  Tobacco Use   Smoking status: Never   Smokeless tobacco: Never  Vaping Use   Vaping status: Never Used  Substance and Sexual Activity   Alcohol use: Yes    Comment: occ   Drug use: No   Sexual activity: Not Currently    Birth control/protection: Surgical

## 2023-03-16 ENCOUNTER — Ambulatory Visit (INDEPENDENT_AMBULATORY_CARE_PROVIDER_SITE_OTHER): Payer: Managed Care, Other (non HMO) | Admitting: Orthopaedic Surgery

## 2023-03-16 DIAGNOSIS — G5602 Carpal tunnel syndrome, left upper limb: Secondary | ICD-10-CM

## 2023-03-23 ENCOUNTER — Encounter: Payer: Self-pay | Admitting: Family Medicine

## 2023-03-23 ENCOUNTER — Telehealth: Payer: Managed Care, Other (non HMO) | Admitting: Family Medicine

## 2023-03-23 DIAGNOSIS — E8940 Asymptomatic postprocedural ovarian failure: Secondary | ICD-10-CM

## 2023-03-23 DIAGNOSIS — G5603 Carpal tunnel syndrome, bilateral upper limbs: Secondary | ICD-10-CM

## 2023-03-23 DIAGNOSIS — I10 Essential (primary) hypertension: Secondary | ICD-10-CM

## 2023-03-23 DIAGNOSIS — M1612 Unilateral primary osteoarthritis, left hip: Secondary | ICD-10-CM

## 2023-03-23 DIAGNOSIS — Z90710 Acquired absence of both cervix and uterus: Secondary | ICD-10-CM

## 2023-03-23 MED ORDER — VENLAFAXINE HCL ER 37.5 MG PO CP24: 37.5000 mg | ORAL_CAPSULE | Freq: Every day | ORAL | 1 refills | Status: DC

## 2023-03-23 MED ORDER — ESTRADIOL 0.025 MG/24HR TD PTWK
0.0250 mg | MEDICATED_PATCH | TRANSDERMAL | 12 refills | Status: AC
Start: 2023-03-23 — End: ?

## 2023-03-23 MED ORDER — VALSARTAN-HYDROCHLOROTHIAZIDE 320-25 MG PO TABS
1.0000 | ORAL_TABLET | Freq: Every day | ORAL | 1 refills | Status: DC
Start: 2023-03-23 — End: 2023-12-18

## 2023-03-23 MED ORDER — CYCLOBENZAPRINE HCL 10 MG PO TABS
10.0000 mg | ORAL_TABLET | Freq: Every evening | ORAL | 2 refills | Status: DC | PRN
Start: 2023-03-23 — End: 2023-12-12

## 2023-03-23 NOTE — Progress Notes (Signed)
 Virtual Visit via Video Note  I connected with Renee Collier, on 03/23/2023 at 4:21 PM by video enabled telemedicine device and verified that I am speaking with the correct person using two identifiers.   Consent: I discussed the limitations, risks, security and privacy concerns of performing an evaluation and management service by telemedicine and the availability of in person appointments. I also discussed with the patient that there may be a patient responsible charge related to this service. The patient expressed understanding and agreed to proceed.   Location of Patient: Home  Location of Provider: Home office   Persons participating in Telemedicine visit: Nakhia Caprice Red [place]-CMA Dr. Alvis Lemmings     History of Present Illness: Renee Collier is a 53 y.o. year old female  with hypertension, left hip osteoarthritis, left carpal tunnel syndrome seen for an office visit.   Discussed the use of AI scribe software for clinical note transcription with the patient, who gave verbal consent to proceed.   She reports that her blood pressure has been well controlled since Losartan/HCTZ was switched to valsartan/HCTZ at her last visit. She also notes that she has been feeling better overall, despite some ongoing issues with her left hip and hand. She has been diagnosed with carpal tunnel syndrome and left hip osteoarthritis by her orthopedic doctor. The patient has reached the limit for cortisone injections for her hip and is considering surgery as the next step. She expresses some hesitation due to her age and the potential impact on her work and daily life. She lives alone and is concerned about managing post-surgery recovery.  In addition to her orthopedic issues, the patient is also dealing with menopausal symptoms. She has been using Climara patches for hot flashes, which have been effective. However, she still experiences some irritability. She had previously been prescribed  Paxil for this, but stopped taking it after her last visit. She is unsure if the Paxil was effective for her mood.      Past Medical History:  Diagnosis Date   Arthritis    hips   Hypertension    No Known Allergies  Current Outpatient Medications on File Prior to Visit  Medication Sig Dispense Refill   traMADol (ULTRAM) 50 MG tablet Take 1 tablet (50 mg total) by mouth every 12 (twelve) hours as needed. 30 tablet 2   No current facility-administered medications on file prior to visit.    ROS: See HPI  Observations/Objective: Awake, alert, oriented x3 Not in acute distress Normal mood      Latest Ref Rng & Units 12/14/2022    2:16 PM 02/10/2022    8:36 AM 09/23/2021    4:01 PM  CMP  Glucose 70 - 99 mg/dL 161  96  096   BUN 6 - 24 mg/dL 9  8  11    Creatinine 0.57 - 1.00 mg/dL 0.45  4.09  8.11   Sodium 134 - 144 mmol/L 141  140  141   Potassium 3.5 - 5.2 mmol/L 4.1  4.2  4.0   Chloride 96 - 106 mmol/L 101  103  100   CO2 20 - 29 mmol/L 24  22  23    Calcium 8.7 - 10.2 mg/dL 9.7  8.8  91.4   Total Protein 6.0 - 8.5 g/dL 7.2  6.9    Total Bilirubin 0.0 - 1.2 mg/dL 0.2  0.2    Alkaline Phos 44 - 121 IU/L 134  97    AST 0 - 40 IU/L 14  13    ALT 0 - 32 IU/L 11  7      Lipid Panel     Component Value Date/Time   CHOL 143 02/10/2022 0836   TRIG 80 02/10/2022 0836   HDL 49 02/10/2022 0836   CHOLHDL 3.2 01/01/2020 1414   CHOLHDL 2.4 Ratio 11/23/2006 2033   VLDL 20 11/23/2006 2033   LDLCALC 78 02/10/2022 0836   LABVLDL 16 02/10/2022 0836    Lab Results  Component Value Date   HGBA1C 5.6 02/10/2022     Assessment and Plan:    Left Hip Osteoarthritis Severe symptoms affecting quality of life. Exhausted conservative management options including medications, physical therapy, and cortisone injections. Patient considering surgical intervention. -Schedule preoperative clearance appointment for EKG and blood pressure documentation. -Consideration for hip replacement  surgery pending clearance.  Hypertension Patient reports home blood pressure readings are normal. -Continue Valsartan-Hydrochlorothiazide. -Counseled on blood pressure goal of less than 130/80, low-sodium, DASH diet, medication compliance, 150 minutes of moderate intensity exercise per week. Discussed medication compliance, adverse effects.   Menopausal Symptoms -Surgical Menopause Hot flashes improved with Climara patches. Reports ongoing irritability. -Discontinue Paxil. -Start Effexor 37.5mg  for irritability. -Continue Climara patches.  Carpal Tunnel Syndrome Managed by orthopedic specialist, Dr. Roda Shutters.         Follow Up Instructions: To schedule appointment for preop clearance for left hip replacement   I discussed the assessment and treatment plan with the patient. The patient was provided an opportunity to ask questions and all were answered. The patient agreed with the plan and demonstrated an understanding of the instructions.   The patient was advised to call back or seek an in-person evaluation if the symptoms worsen or if the condition fails to improve as anticipated.     I provided 16 minutes total of Telehealth time during this encounter including median intraservice time, reviewing previous notes, investigations, ordering medications, medical decision making, coordinating care and patient verbalized understanding at the end of the visit.     Hoy Register, MD, FAAFP. [facility] and Wellness [place], [place] [phone]   03/23/2023, 4:21 PM

## 2023-03-25 ENCOUNTER — Telehealth: Payer: Self-pay

## 2023-03-25 NOTE — Telephone Encounter (Signed)
 Patient has been called and informed of appointment date and time.

## 2023-03-25 NOTE — Telephone Encounter (Signed)
 Can patient be placed on nurse schedule for EKG and lab work for surgery clearance.   Copied from CRM [id number]. Topic: Appointments - Appointment Scheduling >> Mar 25, 2023 10:43 AM Haroldine Laws wrote: Reason for CRM:  Pt called needing to schedule just the EKG and Lab work for Surgical Clearance.  She has already had the virtual appt with Dr. Alvis Lemmings on the 19th

## 2023-03-25 NOTE — Telephone Encounter (Signed)
 She needs an office visit as her last blood pressure was elevated.

## 2023-04-11 ENCOUNTER — Ambulatory Visit
Admission: RE | Admit: 2023-04-11 | Discharge: 2023-04-11 | Disposition: A | Discharge status: Home / Self Care | Payer: Managed Care, Other (non HMO) | Source: Ambulatory Visit | Attending: Family Medicine | Admitting: Family Medicine

## 2023-04-11 DIAGNOSIS — Z1231 Encounter for screening mammogram for malignant neoplasm of breast: Secondary | ICD-10-CM

## 2023-04-14 ENCOUNTER — Ambulatory Visit: Payer: Managed Care, Other (non HMO) | Attending: Family Medicine | Admitting: Family Medicine

## 2023-04-14 ENCOUNTER — Encounter: Payer: Self-pay | Admitting: Family Medicine

## 2023-04-14 VITALS — BP 131/83 | HR 91 | Ht 64.0 in | Wt 214.4 lb

## 2023-04-14 DIAGNOSIS — G5603 Carpal tunnel syndrome, bilateral upper limbs: Secondary | ICD-10-CM

## 2023-04-14 DIAGNOSIS — I10 Essential (primary) hypertension: Secondary | ICD-10-CM

## 2023-04-14 DIAGNOSIS — Z01818 Encounter for other preprocedural examination: Secondary | ICD-10-CM | POA: Diagnosis not present

## 2023-04-14 DIAGNOSIS — M1612 Unilateral primary osteoarthritis, left hip: Secondary | ICD-10-CM | POA: Diagnosis not present

## 2023-04-14 NOTE — Progress Notes (Signed)
 Subjective:  Patient ID: Renee Collier, female    DOB: 06/06/1970  Age: 53 y.o. MRN: [id number]  CC: Pre-op Exam     Discussed the use of AI scribe software for clinical note transcription with the patient, who gave verbal consent to proceed.  History of Present Illness The patient, with a history of  hypertension, left hip osteoarthritis, left carpal tunnel syndrome is scheduled for a left hip surgery and a left pleural tunnel surgery. The patient's blood pressure which was previously elevated is well-controlled.  The patient has concerns about recovery and assistance post-surgery as she lives alone. She has decided to prioritize the hand surgery over the hip surgery due to the lesser recovery time and the availability of her daughter to assist post-surgery. The patient is also actively trying to improve her health through exercise and diet changes.  She is able to climb a flight of stairs without getting dyspneic.  She has been walking regularly and has joined a fitness center. She has also reduced her snack intake and has switched to diet soda. The patient recently went on a cruise and feels relaxed.    Past Medical History:  Diagnosis Date   Arthritis    hips   Hypertension     Past Surgical History:  Procedure Laterality Date   DILATION AND CURETTAGE OF UTERUS N/A 04/27/2017   Procedure: Ultrasound Guided Dilatation and Curettage;  Surgeon: Allie Bossier, MD;  Location: [facility];  Service: Gynecology;  Laterality: N/A;   ENDOMETRIAL ABLATION     HYSTEROSCOPY N/A 03/12/2014   Procedure: HYSTEROSCOPY WITH HYDROTHERMAL ABLATION;  Surgeon: Tereso Newcomer, MD;  Location: [facility];  Service: Gynecology;  Laterality: N/A;   TUBAL LIGATION     VAGINAL HYSTERECTOMY Left 06/10/2020   Procedure: HYSTERECTOMY VAGINAL with LEFT Salpingectomy;  Surgeon: Adam Phenix, MD;  Location: [facility] OR;  Service: Gynecology;  Laterality: Left;    Family History  Problem Relation Age of  Onset   Cancer Mother        cervical   Ovarian cancer Sister    Colon cancer Neg Hx    Esophageal cancer Neg Hx    Rectal cancer Neg Hx    Stomach cancer Neg Hx     Social History   Socioeconomic History   Marital status: Single    Spouse name: Not on file   Number of children: Not on file   Years of education: Not on file   Highest education level: 12th grade  Occupational History   Not on file  Tobacco Use   Smoking status: Never   Smokeless tobacco: Never  Vaping Use   Vaping status: Never Used  Substance and Sexual Activity   Alcohol use: Yes    Comment: occ   Drug use: No   Sexual activity: Not Currently    Birth control/protection: Surgical  Other Topics Concern   Not on file  Social History Narrative   Not on file   Social Drivers of Health   Financial Resource Strain: Not on file  Food Insecurity: Low Risk  (09/14/2022)   Received from [facility]   Hunger Vital Sign    Worried About Running Out of Food in the Last Year: Never true    Ran Out of Food in the Last Year: Never true  Transportation Needs: Not on file (09/14/2022)  Physical Activity: Not on file  Stress: Not on file  Social Connections: Not on file    No  Known Allergies  Outpatient Medications Prior to Visit  Medication Sig Dispense Refill   cyclobenzaprine (FLEXERIL) 10 MG tablet Take 1 tablet (10 mg total) by mouth at bedtime as needed for muscle spasms. 30 tablet 2   estradiol (CLIMARA - DOSED IN MG/24 HR) 0.025 mg/24hr patch Place 1 patch (0.025 mg total) onto the skin once a week. 4 patch 12   traMADol (ULTRAM) 50 MG tablet Take 1 tablet (50 mg total) by mouth every 12 (twelve) hours as needed. 30 tablet 2   valsartan-hydrochlorothiazide (DIOVAN-HCT) 320-25 MG tablet Take 1 tablet by mouth daily. 90 tablet 1   venlafaxine XR (EFFEXOR XR) 37.5 MG 24 hr capsule Take 1 capsule (37.5 mg total) by mouth daily with breakfast. 90 capsule 1   No facility-administered medications prior to  visit.     ROS Review of Systems  Constitutional:  Negative for activity change and appetite change.  HENT:  Negative for sinus pressure and sore throat.   Respiratory:  Negative for chest tightness, shortness of breath and wheezing.   Cardiovascular:  Negative for chest pain and palpitations.  Gastrointestinal:  Negative for abdominal distention, abdominal pain and constipation.  Genitourinary: Negative.   Musculoskeletal:        See HPI  Psychiatric/Behavioral:  Negative for behavioral problems and dysphoric mood.     Objective:  BP 131/83   Pulse 91   Ht 5\' 4"  (1.626 m)   Wt 214 lb 6.4 oz (97.3 kg)   LMP 06/10/2020   SpO2 97%   BMI 36.80 kg/m      04/14/2023    8:49 AM 01/06/2023    8:49 AM 12/14/2022    2:04 PM  BP/Weight  Systolic BP 131  409  Diastolic BP 83  102  Wt. (Lbs) 214.4 216   BMI 36.8 kg/m2 37.08 kg/m2       Physical Exam Constitutional:      Appearance: She is well-developed.  Cardiovascular:     Rate and Rhythm: Normal rate.     Heart sounds: Normal heart sounds. No murmur heard. Pulmonary:     Effort: Pulmonary effort is normal.     Breath sounds: Normal breath sounds. No wheezing or rales.  Chest:     Chest wall: No tenderness.  Abdominal:     General: Bowel sounds are normal. There is no distension.     Palpations: Abdomen is soft. There is no mass.     Tenderness: There is no abdominal tenderness.  Musculoskeletal:        General: Normal range of motion.     Right lower leg: No edema.     Left lower leg: No edema.     Comments: Left fore arm in the brace  Neurological:     Mental Status: She is alert and oriented to person, place, and time.  Psychiatric:        Mood and Affect: Mood normal.        Latest Ref Rng & Units 12/14/2022    2:16 PM 02/10/2022    8:36 AM 09/23/2021    4:01 PM  CMP  Glucose 70 - 99 mg/dL 811  96  914   BUN 6 - 24 mg/dL 9  8  11    Creatinine 0.57 - 1.00 mg/dL 7.82  9.56  2.13   Sodium 134 - 144  mmol/L 141  140  141   Potassium 3.5 - 5.2 mmol/L 4.1  4.2  4.0   Chloride 96 - 106  mmol/L 101  103  100   CO2 20 - 29 mmol/L 24  22  23    Calcium 8.7 - 10.2 mg/dL 9.7  8.8  81.1   Total Protein 6.0 - 8.5 g/dL 7.2  6.9    Total Bilirubin 0.0 - 1.2 mg/dL 0.2  0.2    Alkaline Phos 44 - 121 IU/L 134  97    AST 0 - 40 IU/L 14  13    ALT 0 - 32 IU/L 11  7      Lipid Panel     Component Value Date/Time   CHOL 143 02/10/2022 0836   TRIG 80 02/10/2022 0836   HDL 49 02/10/2022 0836   CHOLHDL 3.2 01/01/2020 1414   CHOLHDL 2.4 Ratio 11/23/2006 2033   VLDL 20 11/23/2006 2033   LDLCALC 78 02/10/2022 0836    CBC    Component Value Date/Time   WBC 5.6 02/10/2022 0836   WBC 6.3 02/13/2021 1100   RBC 4.46 02/10/2022 0836   RBC 4.43 02/13/2021 1100   HGB 13.4 02/10/2022 0836   HCT 40.5 02/10/2022 0836   PLT 327 02/10/2022 0836   MCV 91 02/10/2022 0836   MCH 30.0 02/10/2022 0836   MCH 30.0 02/13/2021 1100   MCHC 33.1 02/10/2022 0836   MCHC 33.0 02/13/2021 1100   RDW 13.7 02/10/2022 0836   LYMPHSABS 3.6 (H) 02/10/2022 0836   MONOABS 0.6 01/05/2021 1754   EOSABS 0.1 02/10/2022 0836   BASOSABS 0.0 02/10/2022 0836    Lab Results  Component Value Date   HGBA1C 5.6 02/10/2022       Assessment & Plan Left Carpal Tunnel Syndrome Awaiting surgery date for left carpal tunnel release orthopedic Dr Roda Shutters. Anticipates four-week recovery with home support. -EKG reveals no ischemic changes - Send medical optimization letter for left carpal tunnel release surgery to the surgeon, low risk of MACE.  Left Hip Osteoarthritis Scheduled for left hip surgery.  - Plans to delay surgery until family support is available. - Send medical optimization letter for left hip surgery to the surgeon.  Hypertension Blood pressure well-controlled with lifestyle changes including increased exercise and dietary modifications. - Continue current management and lifestyle modifications.   Follow-up Advised  follow-up in five months for medication refills and cholesterol check. - Schedule follow-up appointment in five months. - Check cholesterol levels at next visit.      No orders of the defined types were placed in this encounter.   Follow-up: Return in about 5 months (around 09/14/2023).       Hoy Register, MD, FAAFP. [facility] and Wellness [place], [place] [phone]   04/14/2023, 9:21 AM

## 2023-04-14 NOTE — Patient Instructions (Addendum)
 VISIT SUMMARY:  During today's visit, we discussed your upcoming surgeries, your well-controlled blood pressure, and your efforts to improve your overall health through exercise and diet changes. We also addressed your concerns about post-surgery recovery and assistance.  YOUR PLAN:  -LEFT CARPAL TUNNEL SYNDROME: Carpal tunnel syndrome is a condition that causes pain, numbness, and tingling in the hand and arm. You are scheduled for left carpal tunnel release surgery, which is expected to have a four-week recovery period with home support. We will send a medical clearance letter to your surgeon.  -LEFT HIP OSTEOARTHRITIS: Osteoarthritis of the hip is a condition where the cartilage in the hip joint wears down over time, causing pain and stiffness. You are scheduled for left hip surgery, but you plan to delay it until family support is available. We will send a medical clearance letter to your surgeon.  -HYPERTENSION: Hypertension, or high blood pressure, is a condition where the force of the blood against your artery walls is too high. Your blood pressure is well-controlled with your current lifestyle changes, including increased exercise and dietary modifications. Continue with your current management and lifestyle changes.  INSTRUCTIONS:  Please schedule a follow-up appointment in five months for medication refills and a cholesterol check. We will check your cholesterol levels at your next visit.

## 2023-04-19 ENCOUNTER — Telehealth: Payer: Self-pay | Admitting: Orthopaedic Surgery

## 2023-04-19 NOTE — Telephone Encounter (Signed)
 Left voicemail message providing patient with name and direct number to schedule left carpal tunnel release with Dr. Roda Shutters.  Surgery order indicates patient is interested in having surgery in April.

## 2023-05-24 ENCOUNTER — Ambulatory Visit (INDEPENDENT_AMBULATORY_CARE_PROVIDER_SITE_OTHER): Admitting: Orthopaedic Surgery

## 2023-05-24 DIAGNOSIS — G5602 Carpal tunnel syndrome, left upper limb: Secondary | ICD-10-CM | POA: Diagnosis not present

## 2023-05-24 MED ORDER — LIDOCAINE HCL 1 % IJ SOLN
1.0000 mL | INTRAMUSCULAR | Status: AC | PRN
Start: 2023-05-24 — End: 2023-05-24
  Administered 2023-05-24: 1 mL

## 2023-05-24 MED ORDER — METHYLPREDNISOLONE ACETATE 40 MG/ML IJ SUSP
40.0000 mg | INTRAMUSCULAR | Status: AC | PRN
Start: 2023-05-24 — End: 2023-05-24
  Administered 2023-05-24: 40 mg

## 2023-05-24 MED ORDER — BUPIVACAINE HCL 0.5 % IJ SOLN
1.0000 mL | INTRAMUSCULAR | Status: AC | PRN
Start: 2023-05-24 — End: 2023-05-24
  Administered 2023-05-24: 1 mL

## 2023-05-24 NOTE — Progress Notes (Signed)
 Office Visit Note   Patient: Renee Collier           Date of Birth: 09-28-1970           MRN: [id number] Visit Date: 05/24/2023              Requested by: Joaquin Mulberry, MD [address] [place],  [place] [address] PCP: Joaquin Mulberry, MD   Assessment & Plan: Visit Diagnoses:  1. Left carpal tunnel syndrome     Plan: Left carpal tunnel injection performed for left carpal tunnel syndrome.  She will continue wear the splint at night.  She will call us  when she is ready to reschedule surgery.  Follow-Up Instructions: No follow-ups on file.   Orders:  Orders Placed This Encounter  Procedures   Hand/UE Inj   No orders of the defined types were placed in this encounter.     Procedures: Hand/UE Inj: L carpal tunnel for carpal tunnel syndrome on 05/24/2023 5:22 PM Details: 25 G needle Medications: 1 mL lidocaine  1 %; 1 mL bupivacaine  0.5 %; 40 mg methylPREDNISolone  acetate 40 MG/ML Outcome: tolerated well, no immediate complications Patient was prepped and draped in the usual sterile fashion.       Clinical Data: No additional findings.   Subjective: Chief Complaint  Patient presents with   Left Hand - Follow-up, Pain    HPI [name] comes in today for her left carpal tunnel injection.  She had to reschedule surgery due to financial constraints. Review of Systems   Objective: Vital Signs: LMP 06/10/2020   Physical Exam  Ortho Exam Exam of the left hand is unchanged from prior visit. Specialty Comments:  No specialty comments available.  Imaging: No results found.   PMFS History: Patient Active Problem List   Diagnosis Date Noted   Right carpal tunnel syndrome 10/30/2021   Left carpal tunnel syndrome 10/30/2021   Prediabetes 03/31/2021   Menometrorrhagia 06/10/2020   Primary osteoarthritis of left hip 10/18/2019   Vaginal discharge 12/14/2018   Status post HTA endometrial ablation 04/10/2014   Abnormal uterine bleeding (AUB)  12/18/2013   Past Medical History:  Diagnosis Date   Arthritis    hips   Hypertension     Family History  Problem Relation Age of Onset   Cancer Mother        cervical   Ovarian cancer Sister    Colon cancer Neg Hx    Esophageal cancer Neg Hx    Rectal cancer Neg Hx    Stomach cancer Neg Hx     Past Surgical History:  Procedure Laterality Date   DILATION AND CURETTAGE OF UTERUS N/A 04/27/2017   Procedure: Ultrasound Guided Dilatation and Curettage;  Surgeon: Ana Balling, MD;  [facility];  Service: Gynecology;  Laterality: N/A;   ENDOMETRIAL ABLATION     HYSTEROSCOPY N/A 03/12/2014   Procedure: HYSTEROSCOPY WITH HYDROTHERMAL ABLATION;  Surgeon: Julianne Octave, MD;  Location: [facility];  Service: Gynecology;  Laterality: N/A;   TUBAL LIGATION     VAGINAL HYSTERECTOMY Left 06/10/2020   Procedure: HYSTERECTOMY VAGINAL with LEFT Salpingectomy;  Surgeon: Tresia Fruit, MD;  Location: [facility] OR;  Service: Gynecology;  Laterality: Left;   Social History   Occupational History   Not on file  Tobacco Use   Smoking status: Never   Smokeless tobacco: Never  Vaping Use   Vaping status: Never Used  Substance and Sexual Activity   Alcohol use: Yes  Comment: occ   Drug use: No   Sexual activity: Not Currently    Birth control/protection: Surgical

## 2023-05-26 ENCOUNTER — Other Ambulatory Visit: Payer: Self-pay | Admitting: Physician Assistant

## 2023-05-26 MED ORDER — ONDANSETRON HCL 4 MG PO TABS: 4.0000 mg | ORAL_TABLET | Freq: Three times a day (TID) | ORAL | 0 refills | Status: DC | PRN

## 2023-05-26 MED ORDER — HYDROCODONE-ACETAMINOPHEN 5-325 MG PO TABS: 1.0000 | ORAL_TABLET | Freq: Three times a day (TID) | ORAL | 0 refills | Status: DC | PRN

## 2023-06-03 ENCOUNTER — Encounter: Admitting: Physician Assistant

## 2023-06-07 ENCOUNTER — Telehealth: Admitting: Physician Assistant

## 2023-06-07 ENCOUNTER — Encounter: Payer: Self-pay | Admitting: Family Medicine

## 2023-06-07 DIAGNOSIS — B029 Zoster without complications: Secondary | ICD-10-CM

## 2023-06-07 MED ORDER — VALACYCLOVIR HCL 1 G PO TABS
1000.0000 mg | ORAL_TABLET | Freq: Three times a day (TID) | ORAL | 0 refills | Status: AC
Start: 2023-06-07 — End: 2023-06-14

## 2023-06-07 NOTE — Progress Notes (Signed)

## 2023-08-31 ENCOUNTER — Ambulatory Visit: Admitting: Physician Assistant

## 2023-09-06 ENCOUNTER — Other Ambulatory Visit: Payer: Self-pay

## 2023-09-06 ENCOUNTER — Ambulatory Visit (INDEPENDENT_AMBULATORY_CARE_PROVIDER_SITE_OTHER): Admitting: Sports Medicine

## 2023-09-06 ENCOUNTER — Ambulatory Visit (INDEPENDENT_AMBULATORY_CARE_PROVIDER_SITE_OTHER): Admitting: Physician Assistant

## 2023-09-06 VITALS — Ht 63.5 in | Wt 223.0 lb

## 2023-09-06 DIAGNOSIS — M1612 Unilateral primary osteoarthritis, left hip: Secondary | ICD-10-CM

## 2023-09-06 MED ORDER — METHYLPREDNISOLONE ACETATE 40 MG/ML IJ SUSP
80.0000 mg | INTRAMUSCULAR | Status: AC | PRN
Start: 2023-09-06 — End: 2023-09-06
  Administered 2023-09-06: 80 mg via INTRA_ARTICULAR

## 2023-09-06 MED ORDER — LIDOCAINE HCL 1 % IJ SOLN
4.0000 mL | INTRAMUSCULAR | Status: AC | PRN
Start: 2023-09-06 — End: 2023-09-06
  Administered 2023-09-06: 4 mL

## 2023-09-06 NOTE — Progress Notes (Signed)
 Office Visit Note   Patient: Renee Collier           Date of Birth: 05-Apr-1970           MRN: [id number] Visit Date: 09/06/2023              Requested by: Delbert Clam, MD [address] [place],  [place] [address] PCP: Delbert Clam, MD   Assessment & Plan: Visit Diagnoses:  1. Primary osteoarthritis of left hip     Plan: Impression is left hip osteoarthritis.  Today, we discussed repeat cortisone injection versus total hip arthroplasty.  She is currently unsure who she would stay with her who would stay with her postop as her daughter has recently been accepted to nursing school and starts next week.  She tells me that there is a possibility she may be able to stay with her sister.  She would like to repeat cortisone injection today while she gathers a definitive plan.  Once she is ready to schedule total hip replacement, she will follow-up with Dr. Jerri.  Otherwise, follow-up with us  as needed.  Follow-Up Instructions: Return if symptoms worsen or fail to improve.   Orders:  No orders of the defined types were placed in this encounter.  No orders of the defined types were placed in this encounter.     Procedures: No procedures performed   Clinical Data: No additional findings.   Subjective: Chief Complaint  Patient presents with   Left Hip - Pain    HPI patient is a pleasant 53 year old female who comes in today with recurrent left hip pain.  History of osteoarthritis.  She has been seen by us  multiple times for this and has undergone cortisone injections in January 2024 and again in October 2024.  She had about 9 months relief following the most recent cortisone injection.  She is here today for further evaluation treat recommendation.  She is having pain not only to the groin but is now radiating into the thigh and sometimes down to the knee.  She has stiffness especially when she is sitting for a while and goes to stand.  She has been taking muscle  relaxers, tramadol  and over-the-counter pain medication.  Review of Systems as detailed in HPI.  All others reviewed and are negative.   Objective: Vital Signs: LMP 06/10/2020   Physical Exam well-developed well-nourished female in no acute distress.  Alert and oriented x 3.  Ortho Exam left hip exam: Pain with logroll, FADIR and Stinchfield testing.  She is neurovascularly intact distally.  Specialty Comments:  No specialty comments available.  Imaging: No new imaging   PMFS History: Patient Active Problem List   Diagnosis Date Noted   Right carpal tunnel syndrome 10/30/2021   Left carpal tunnel syndrome 10/30/2021   Prediabetes 03/31/2021   Menometrorrhagia 06/10/2020   Primary osteoarthritis of left hip 10/18/2019   Vaginal discharge 12/14/2018   Status post HTA endometrial ablation 04/10/2014   Abnormal uterine bleeding (AUB) 12/18/2013   Past Medical History:  Diagnosis Date   Arthritis    hips   Hypertension     Family History  Problem Relation Age of Onset   Cancer Mother        cervical   Ovarian cancer Sister    Colon cancer Neg Hx    Esophageal cancer Neg Hx    Rectal cancer Neg Hx    Stomach cancer Neg Hx     Past Surgical History:  Procedure  Laterality Date   DILATION AND CURETTAGE OF UTERUS N/A 04/27/2017   Procedure: Ultrasound Guided Dilatation and Curettage;  Surgeon: Starla Harland BROCKS, MD;  Location: [facility];  Service: Gynecology;  Laterality: N/A;   ENDOMETRIAL ABLATION     HYSTEROSCOPY N/A 03/12/2014   Procedure: HYSTEROSCOPY WITH HYDROTHERMAL ABLATION;  Surgeon: Gloris DELENA Hugger, MD;  Location: [facility];  Service: Gynecology;  Laterality: N/A;   TUBAL LIGATION     VAGINAL HYSTERECTOMY Left 06/10/2020   Procedure: HYSTERECTOMY VAGINAL with LEFT Salpingectomy;  Surgeon: Eveline Lynwood MATSU, MD;  Location: [facility];  Service: Gynecology;  Laterality: Left;   Social History   Occupational History   Not on file  Tobacco Use   Smoking  status: Never   Smokeless tobacco: Never  Vaping Use   Vaping status: Never Used  Substance and Sexual Activity   Alcohol use: Yes    Comment: occ   Drug use: No   Sexual activity: Not Currently    Birth control/protection: Surgical

## 2023-09-06 NOTE — Progress Notes (Signed)
     Procedure Note  Patient: Renee Collier             Date of Birth: 08-Aug-1970           MRN: [id number]             Visit Date: 09/06/2023  Procedures: Visit Diagnoses:  1. Primary osteoarthritis of left hip    Large Joint Inj: L hip joint on 09/06/2023 10:10 AM Indications: pain Details: 22 G 3.5 in needle, ultrasound-guided anterior approach Medications: 4 mL lidocaine  1 %; 80 mg methylPREDNISolone  acetate 40 MG/ML Outcome: tolerated well, no immediate complications  Procedure: US -guided intra-articular hip injection, Left After discussion on risks/benefits/indications and informed verbal consent was obtained, a timeout was performed. Patient was lying supine on exam table. The hip was cleaned with betadine and alcohol swabs. Then utilizing ultrasound guidance, the patient's femoral head and neck junction was identified and subsequently injected with 4:2 lidocaine :depomedrol via an in-plane approach with ultrasound visualization of the injectate administered into the hip joint. Patient tolerated procedure well without immediate complications.  Procedure, treatment alternatives, risks and benefits explained, specific risks discussed. Consent was given by the patient. Immediately prior to procedure a time out was called to verify the correct patient, procedure, equipment, support staff and site/side marked as required. Patient was prepped and draped in the usual sterile fashion.     - patient tolerated procedure well, discussed post-injection protocol - follow-up with Morna Bimler and/or Dr. Jerri as indicated; I am happy to see them as needed  Lonell Sprang, DO Primary Care Sports Medicine Physician  [facility]  This note was dictated using Dragon naturally speaking software and may contain errors in syntax, spelling, or content which have not been identified prior to signing this note.

## 2023-09-06 NOTE — Addendum Note (Signed)
 Addended by: Bryella Diviney on: 09/06/2023 09:02 AM   Modules accepted: Orders

## 2023-09-14 ENCOUNTER — Other Ambulatory Visit: Payer: Self-pay | Admitting: Family Medicine

## 2023-10-19 ENCOUNTER — Other Ambulatory Visit: Payer: Self-pay | Admitting: Family Medicine

## 2023-11-22 ENCOUNTER — Ambulatory Visit: Payer: Self-pay

## 2023-11-22 NOTE — Telephone Encounter (Signed)
 FYI Only or Action Required?: FYI only for provider.- Sent to UC  Patient was last seen in primary care on 04/14/2023 by Delbert Clam, MD.  Called Nurse Triage reporting Abdominal Cramping.  Symptoms began a week ago.  Interventions attempted: OTC medications: tylenol .  Symptoms are: gradually worsening.  Triage Disposition: See HCP Within 4 Hours (Or PCP Triage)  Patient/caregiver understands and will follow disposition?: Yes         Copied from CRM #[id number]. Topic: Clinical - Red Word Triage >> Nov 22, 2023  9:20 AM Treva T wrote: [facility] that prompted transfer to Nurse Triage: Patient calling state she is having increased abdominal cramping and pain. Per patient reports she has been feeling bad. Reason for Disposition  [1] MILD-MODERATE pain AND [2] constant AND [3] present > 2 hours  Answer Assessment - Initial Assessment Questions Pt mentioned having a hysterectomy and was worried what this cramping could be.    1. LOCATION: Where does it hurt?      Middle upper  abdomen 2. RADIATION: Does the pain shoot anywhere else? (e.g., chest, back)     no 3. ONSET: When did the pain begin? (e.g., minutes, hours or days ago)      A week ago 4. SUDDEN: Gradual or sudden onset?     suddenly 5. PATTERN Does the pain come and go, or is it constant?     Comes and goes, today been lingering more 6. SEVERITY: How bad is the pain?  (e.g., Scale 1-10; mild, moderate, or severe)     9 7. RECURRENT SYMPTOM: Have you ever had this type of stomach pain before? If Yes, ask: When was the last time? and What happened that time?      no  9. CARDIAC SYMPTOMS: Do you have any of the following symptoms: chest pain, difficulty breathing, sweating, nausea?     no 10. OTHER SYMPTOMS: Do you have any other symptoms? (e.g., back pain, diarrhea, fever, urination pain, vomiting)       no  Protocols used: Abdominal Pain - Upper-A-AH

## 2023-11-22 NOTE — Telephone Encounter (Signed)
Disposition noted.

## 2023-12-05 ENCOUNTER — Encounter: Payer: Self-pay | Admitting: Radiology

## 2023-12-12 ENCOUNTER — Encounter (HOSPITAL_COMMUNITY): Payer: Self-pay

## 2023-12-12 ENCOUNTER — Ambulatory Visit (HOSPITAL_COMMUNITY)
Admission: EM | Admit: 2023-12-12 | Discharge: 2023-12-12 | Disposition: A | Discharge status: Home / Self Care | Attending: Physician Assistant | Admitting: Physician Assistant

## 2023-12-12 DIAGNOSIS — R519 Headache, unspecified: Secondary | ICD-10-CM

## 2023-12-12 DIAGNOSIS — J0140 Acute pansinusitis, unspecified: Secondary | ICD-10-CM | POA: Diagnosis not present

## 2023-12-12 DIAGNOSIS — H9202 Otalgia, left ear: Secondary | ICD-10-CM | POA: Diagnosis not present

## 2023-12-12 MED ORDER — AMOXICILLIN-POT CLAVULANATE 875-125 MG PO TABS: 1.0000 | ORAL_TABLET | Freq: Two times a day (BID) | ORAL | 0 refills | Status: AC

## 2023-12-12 MED ORDER — FLUTICASONE PROPIONATE 50 MCG/ACT NA SUSP: 1.0000 | Freq: Every day | NASAL | 0 refills | Status: AC

## 2023-12-12 NOTE — ED Provider Notes (Signed)
 [facility]    CSN: [id number] Arrival date & time: 12/12/23  9171      History   Chief Complaint Chief Complaint  Patient presents with   Otalgia   Headache    HPI Renee Collier is a 53 y.o. female.   Patient presents today with a several week history of nasal congestion.  She initially thought that this was a cold and so was treating with over-the-counter medication such as Alka-Seltzer plus but then over the past few days has had worsening sinus pain and ear pain prompting evaluation.  She reports ongoing congestion, left otalgia, sinus headache, fatigue/malaise.  She denies any fever, chest pain, shortness of breath, significant cough, nausea/vomiting.  Denies any known sick contacts.  She has not tried additional over-the-counter medication.  She does have a history of sinus infections with similar presentation.  Denies any recent antibiotics or steroids.  She denies any recent sinus procedure.  She is confident that she is not pregnant as she has status post hysterectomy.    Past Medical History:  Diagnosis Date   Arthritis    hips   Hypertension     Patient Active Problem List   Diagnosis Date Noted   Right carpal tunnel syndrome 10/30/2021   Left carpal tunnel syndrome 10/30/2021   Prediabetes 03/31/2021   Menometrorrhagia 06/10/2020   Primary osteoarthritis of left hip 10/18/2019   Vaginal discharge 12/14/2018   Status post HTA endometrial ablation 04/10/2014   Abnormal uterine bleeding (AUB) 12/18/2013    Past Surgical History:  Procedure Laterality Date   DILATION AND CURETTAGE OF UTERUS N/A 04/27/2017   Procedure: Ultrasound Guided Dilatation and Curettage;  Surgeon: Starla Harland BROCKS, MD;  Location: [facility];  Service: Gynecology;  Laterality: N/A;   ENDOMETRIAL ABLATION     HYSTEROSCOPY N/A 03/12/2014   Procedure: HYSTEROSCOPY WITH HYDROTHERMAL ABLATION;  Surgeon: Gloris DELENA Hugger, MD;  Location: [facility];  Service: Gynecology;   Laterality: N/A;   TUBAL LIGATION     VAGINAL HYSTERECTOMY Left 06/10/2020   Procedure: HYSTERECTOMY VAGINAL with LEFT Salpingectomy;  Surgeon: Eveline Lynwood MATSU, MD;  Location: [facility] OR;  Service: Gynecology;  Laterality: Left;    OB History     Gravida  3   Para  3   Term  3   Preterm      AB      Living  3      SAB      IAB      Ectopic      Multiple      Live Births  3            Home Medications    Prior to Admission medications   Medication Sig Start Date End Date Taking? Authorizing Provider  amoxicillin -clavulanate (AUGMENTIN ) 875-125 MG tablet Take 1 tablet by mouth every 12 (twelve) hours. 12/12/23  Yes Kashis Penley K, PA-C  fluticasone  (FLONASE ) 50 MCG/ACT nasal spray Place 1 spray into both nostrils daily. 12/12/23  Yes Jaizon Deroos K, PA-C  estradiol  (CLIMARA  - DOSED IN MG/24 HR) 0.025 mg/24hr patch Place 1 patch (0.025 mg total) onto the skin once a week. 03/23/23   Newlin, Enobong, MD  valsartan -hydrochlorothiazide  (DIOVAN -HCT) 320-25 MG tablet Take 1 tablet by mouth daily. 03/23/23   Newlin, Enobong, MD  venlafaxine  XR (EFFEXOR -XR) 37.5 MG 24 hr capsule Take 1 capsule (37.5 mg total) by mouth daily with breakfast. Please schedule appointment with Dr. Newlin for more refills. 09/14/23   Newlin,  Corrina, MD    Family History Family History  Problem Relation Age of Onset   Cancer Mother        cervical   Ovarian cancer Sister    Colon cancer Neg Hx    Esophageal cancer Neg Hx    Rectal cancer Neg Hx    Stomach cancer Neg Hx     Social History Social History   Tobacco Use   Smoking status: Never   Smokeless tobacco: Never  Vaping Use   Vaping status: Never Used  Substance Use Topics   Alcohol use: Yes    Comment: occ   Drug use: No     Allergies   Patient has no known allergies.   Review of Systems Review of Systems  Constitutional:  Negative for activity change, appetite change, fatigue and fever.  HENT:  Positive for congestion,  ear pain, postnasal drip and sinus pressure. Negative for sneezing and sore throat.   Respiratory:  Negative for cough and shortness of breath.   Cardiovascular:  Negative for chest pain.  Gastrointestinal:  Negative for diarrhea, nausea and vomiting.  Genitourinary:  Positive for urgency.  Neurological:  Positive for headaches. Negative for dizziness and light-headedness.     Physical Exam Triage Vital Signs ED Triage Vitals  Encounter Vitals Group     BP 12/12/23 0917 (!) 143/97     Girls Systolic BP Percentile --      Girls Diastolic BP Percentile --      Boys Systolic BP Percentile --      Boys Diastolic BP Percentile --      Pulse Rate 12/12/23 0917 78     Resp 12/12/23 0917 16     Temp 12/12/23 0917 98.2 F (36.8 C)     Temp Source 12/12/23 0917 Oral     SpO2 12/12/23 0917 95 %     Weight --      Height --      Head Circumference --      Peak Flow --      Pain Score 12/12/23 0916 10     Pain Loc --      Pain Education --      Exclude from Growth Chart --    No data found.  Updated Vital Signs BP (!) 143/97 (BP Location: Left Arm)   Pulse 78   Temp 98.2 F (36.8 C) (Oral)   Resp 16   LMP 06/10/2020   SpO2 95%   Visual Acuity Right Eye Distance:   Left Eye Distance:   Bilateral Distance:    Right Eye Near:   Left Eye Near:    Bilateral Near:     Physical Exam Vitals reviewed.  Constitutional:      General: She is awake. She is not in acute distress.    Appearance: Normal appearance. She is well-developed. She is not ill-appearing.     Comments: Very pleasant female appears stated age in no acute distress sitting comfortably in exam room  HENT:     Head: Normocephalic and atraumatic.     Right Ear: Tympanic membrane, ear canal and external ear normal. Tympanic membrane is not erythematous or bulging.     Left Ear: Ear canal and external ear normal. Tympanic membrane is retracted. Tympanic membrane is not erythematous or bulging.     Nose:     Right  Sinus: Maxillary sinus tenderness and frontal sinus tenderness present.     Left Sinus: Maxillary sinus tenderness and frontal sinus tenderness  present.     Comments: Significant tender palpation of sinuses    Mouth/Throat:     Pharynx: Uvula midline. Postnasal drip present. No oropharyngeal exudate or posterior oropharyngeal erythema.  Cardiovascular:     Rate and Rhythm: Normal rate and regular rhythm.     Heart sounds: Normal heart sounds, S1 normal and S2 normal. No murmur heard. Pulmonary:     Effort: Pulmonary effort is normal.     Breath sounds: Normal breath sounds. No wheezing, rhonchi or rales.     Comments: Clear to auscultation bilaterally Psychiatric:        Behavior: Behavior is cooperative.      UC Treatments / Results  Labs (all labs ordered are listed, but only abnormal results are displayed) Labs Reviewed - No data to display  EKG   Radiology No results found.  Procedures Procedures (including critical care time)  Medications Ordered in UC Medications - No data to display  Initial Impression / Assessment and Plan / UC Course  I have reviewed the triage vital signs and the nursing notes.  Pertinent labs & imaging results that were available during my care of the patient were reviewed by me and considered in my medical decision making (see chart for details).     Patient is well-appearing, afebrile, nontoxic, nontachycardic.  No indication for viral testing as patient has been symptomatic for several weeks and this would not change management.  Chest x-ray was deferred as she had no adventitious lung sounds on exam.  Given prolonged and worsening symptoms concern for secondary bacterial infection.  Patient was started on Augmentin  twice daily for 7 days.  No indication for dose adjustment based on metabolic panel from 12/14/2022 with a creatinine of 0.83 calculated creatinine clearance of 128 mL/min.  Recommended over-the-counter medication including Mucinex,  Flonase , Tylenol . She is to rest and drink plenty of fluid.  Discussed that if symptoms are not improving by next week she should return for reevaluation or see PCP.  If any symptoms and she develops high fever, worsening headache, chest pain, shortness of breath, nausea/vomiting interfering with oral intake, worsening cough she needs to be seen immediately.  Strict return precautions given.  Work excuse note provided.   Final Clinical Impressions(s) / UC Diagnoses   Final diagnoses:  Acute non-recurrent pansinusitis  Sinus headache  Acute otalgia, left     Discharge Instructions      Take Augmentin  twice daily for 7 days.  I recommend over-the-counter medications including Mucinex, Flonase , Tylenol .  Use sinus rinses/nasal saline for additional symptom relief.  Make sure that you rest and drink plenty of fluid.  If your symptoms are not improving within a week please return for reevaluation.  If anything worsens and you have severe headache, shortness of breath, worsening cough, fever, nausea/vomiting interfere with oral intake, weakness you need to be seen immediately.      ED Prescriptions     Medication Sig Dispense Auth. Provider   amoxicillin -clavulanate (AUGMENTIN ) 875-125 MG tablet Take 1 tablet by mouth every 12 (twelve) hours. 14 tablet Gabbie Marzo K, PA-C   fluticasone  (FLONASE ) 50 MCG/ACT nasal spray Place 1 spray into both nostrils daily. 16 g Lennon Boutwell K, PA-C      PDMP not reviewed this encounter.   Sherrell Rocky POUR, PA-C 12/12/23 9059

## 2023-12-12 NOTE — Discharge Instructions (Signed)
 Take Augmentin  twice daily for 7 days.  I recommend over-the-counter medications including Mucinex, Flonase , Tylenol .  Use sinus rinses/nasal saline for additional symptom relief.  Make sure that you rest and drink plenty of fluid.  If your symptoms are not improving within a week please return for reevaluation.  If anything worsens and you have severe headache, shortness of breath, worsening cough, fever, nausea/vomiting interfere with oral intake, weakness you need to be seen immediately.

## 2023-12-12 NOTE — ED Triage Notes (Signed)
 Patient here today with c/o left ear pain and headache X 1 week but worsened on Saturday. Patient also has some nasal congestion. Patient has taken Alka Seltzer Sinus with some relief. No known sick contacts.

## 2023-12-18 ENCOUNTER — Other Ambulatory Visit: Payer: Self-pay | Admitting: Family Medicine

## 2023-12-18 DIAGNOSIS — I10 Essential (primary) hypertension: Secondary | ICD-10-CM

## 2024-01-15 ENCOUNTER — Other Ambulatory Visit: Payer: Self-pay | Admitting: Family Medicine

## 2024-01-15 DIAGNOSIS — I10 Essential (primary) hypertension: Secondary | ICD-10-CM

## 2024-01-17 ENCOUNTER — Ambulatory Visit: Payer: Self-pay | Admitting: Family Medicine

## 2024-01-25 ENCOUNTER — Ambulatory Visit: Admitting: *Deleted

## 2024-01-25 ENCOUNTER — Telehealth: Payer: Self-pay | Admitting: Family Medicine

## 2024-01-25 NOTE — Telephone Encounter (Signed)
 Patient was contacted regarding missed appointment on 01/25/2024. Voicemail left requesting a call back to reschedule.

## 2024-02-08 ENCOUNTER — Telehealth: Payer: Self-pay | Admitting: Family Medicine

## 2024-02-08 ENCOUNTER — Ambulatory Visit: Admitting: *Deleted

## 2024-02-08 NOTE — Telephone Encounter (Signed)
 Pt confirmed appt (per vr)

## 2024-02-09 ENCOUNTER — Encounter: Payer: Self-pay | Admitting: *Deleted

## 2024-02-09 ENCOUNTER — Ambulatory Visit: Payer: Self-pay | Attending: *Deleted | Admitting: *Deleted

## 2024-02-09 VITALS — BP 131/86 | HR 80 | Temp 98.0°F | Ht 63.5 in | Wt 227.8 lb

## 2024-02-09 DIAGNOSIS — H6992 Unspecified Eustachian tube disorder, left ear: Secondary | ICD-10-CM

## 2024-02-09 MED ORDER — PREDNISONE 20 MG PO TABS: 40.0000 mg | ORAL_TABLET | Freq: Every day | ORAL | 0 refills | Status: AC

## 2024-02-09 NOTE — Patient Instructions (Signed)
 Today we talked about your chronic sinus symptoms including congestion, nasal drainage and left ear pain.  urgent care treated you with amoxicillin  and Flonase .   Your symptoms improved somewhat but you still have left ear symptoms including feeling of fullness, popping and cracking and sinus pressure with nasal drainage that varies from clear to thick. We discussed using prednisone  burst and Valsalva maneuver for left eustachian tube dysfunction.   You should continue your Flonase  and an antihistamine to address your  recurring sinus symptoms that most likely are related to allergy Make a follow-up appointment with your primary care provider to discuss other issues

## 2024-02-09 NOTE — Progress Notes (Signed)
 "   Patient ID: Renee Collier, female    DOB: 01/29/1971  MRN: [id number]  CC: Ear Pain (Left ear. Pain started 1 month ago. Pain is stabbing and sharp. )   Subjective: Renee Collier is a 54 y.o. female who presents for evaluation and treatment of left ear pain that has been present for 1 months Per epic review she was seen in urgent care and they felt that her problem was related to sinus pressure and treated her symptoms with amoxicillin  and Flonase . She reports that the symptoms have improved but not resolved.  (Sinus pressure and nasal drainage) She continues to have a sensation of popping and hears cracking in the left ear. The symptoms are worse at night. She denies fever, chills, face pain, tooth pain, jaw pain or colored nasal drainage.  No ear pain or loss of hearing  Her concerns today include: Help to resolve  sinus pressure, nasal drainage and left ear congestion    Patient Active Problem List   Diagnosis Date Noted   Right carpal tunnel syndrome 10/30/2021   Left carpal tunnel syndrome 10/30/2021   Prediabetes 03/31/2021   Menometrorrhagia 06/10/2020   Primary osteoarthritis of left hip 10/18/2019   Vaginal discharge 12/14/2018   Status post HTA endometrial ablation 04/10/2014   Abnormal uterine bleeding (AUB) 12/18/2013     Medications Ordered Prior to Encounter[1]  Allergies[2]  Social History   Socioeconomic History   Marital status: Single    Spouse name: Not on file   Number of children: Not on file   Years of education: Not on file   Highest education level: 12th grade  Occupational History   Not on file  Tobacco Use   Smoking status: Never   Smokeless tobacco: Never  Vaping Use   Vaping status: Never Used  Substance and Sexual Activity   Alcohol use: Yes    Comment: occ   Drug use: No   Sexual activity: Not Currently    Birth control/protection: Surgical  Other Topics Concern   Not on file  Social History Narrative   Not on file   Social  Drivers of Health   Tobacco Use: Low Risk (02/09/2024)   Patient History    Smoking Tobacco Use: Never    Smokeless Tobacco Use: Never    Passive Exposure: Not on file  Financial Resource Strain: Not on file  Food Insecurity: Low Risk (09/14/2022)   Received from [facility]   Epic    Within the past 12 months, you worried that your food would run out before you got money to buy more: Never true    Within the past 12 months, the food you bought just didn't last and you didn't have money to get more. : Never true  Transportation Needs: Not on file (09/14/2022)  Physical Activity: Not on file  Stress: Not on file  Social Connections: Not on file  Intimate Partner Violence: Not on file  Depression (PHQ2-9): Low Risk (03/23/2023)   Depression (PHQ2-9)    PHQ-2 Score: 0  Alcohol Screen: Not on file  Housing: Low Risk (09/14/2022)   Received from [facility]   Epic    What is your living situation today?: I have a steady place to live    Think about the place you live. Do you have problems with any of the following? Choose all that apply:: None/None on this list  Utilities: Low Risk (09/14/2022)   Received from [facility]   Utilities    In  the past 12 months has the electric, gas, oil, or water company threatened to shut off services in your home? : No  Health Literacy: Not on file    Family History  Problem Relation Age of Onset   Cancer Mother        cervical   Ovarian cancer Sister    Colon cancer Neg Hx    Esophageal cancer Neg Hx    Rectal cancer Neg Hx    Stomach cancer Neg Hx     Past Surgical History:  Procedure Laterality Date   DILATION AND CURETTAGE OF UTERUS N/A 04/27/2017   Procedure: Ultrasound Guided Dilatation and Curettage;  Surgeon: Starla Harland BROCKS, MD;  Location: [facility];  Service: Gynecology;  Laterality: N/A;   ENDOMETRIAL ABLATION     HYSTEROSCOPY N/A 03/12/2014   Procedure: HYSTEROSCOPY WITH HYDROTHERMAL ABLATION;  Surgeon: Gloris DELENA Hugger, MD;  Location: [facility];  Service: Gynecology;  Laterality: N/A;   TUBAL LIGATION     VAGINAL HYSTERECTOMY Left 06/10/2020   Procedure: HYSTERECTOMY VAGINAL with LEFT Salpingectomy;  Surgeon: Eveline Lynwood MATSU, MD;  Location: [facility];  Service: Gynecology;  Laterality: Left;    ROS: Review of Systems Negative except as stated above  PHYSICAL EXAM: BP 131/86 (BP Location: Left Arm, Patient Position: Sitting, Cuff Size: Normal)   Pulse 80   Temp 98 F (36.7 C) (Oral)   Ht 5' 3.5 (1.613 m)   Wt 227 lb 12.8 oz (103.3 kg)   LMP 06/10/2020   SpO2 98%   BMI 39.72 kg/m   Physical Exam Vitals and nursing note reviewed.  Constitutional:      Appearance: Normal appearance.  HENT:     Right Ear: Tympanic membrane, ear canal and external ear normal.     Left Ear: Tympanic membrane, ear canal and external ear normal.     Ears:     Comments: Left TM somewhat full suggesting eustachian tube dysfunction    Nose: Nose normal.     Mouth/Throat:     Mouth: Mucous membranes are moist.     Pharynx: Oropharynx is clear.  Eyes:     Conjunctiva/sclera: Conjunctivae normal.  Cardiovascular:     Rate and Rhythm: Normal rate and regular rhythm.  Pulmonary:     Effort: Pulmonary effort is normal.     Breath sounds: Normal breath sounds.  Abdominal:     General: Abdomen is flat. Bowel sounds are normal.     Palpations: Abdomen is soft.  Skin:    General: Skin is warm and dry.  Neurological:     Mental Status: She is alert. Mental status is at baseline.          Latest Ref Rng & Units 12/14/2022    2:16 PM 02/10/2022    8:36 AM 09/23/2021    4:01 PM  CMP  Glucose 70 - 99 mg/dL 899  96  892   BUN 6 - 24 mg/dL 9  8  11    Creatinine 0.57 - 1.00 mg/dL 9.16  9.26  9.30   Sodium 134 - 144 mmol/L 141  140  141   Potassium 3.5 - 5.2 mmol/L 4.1  4.2  4.0   Chloride 96 - 106 mmol/L 101  103  100   CO2 20 - 29 mmol/L 24  22  23    Calcium 8.7 - 10.2 mg/dL 9.7  8.8  89.6   Total Protein 6.0 -  8.5 g/dL 7.2  6.9  Total Bilirubin 0.0 - 1.2 mg/dL 0.2  0.2    Alkaline Phos 44 - 121 IU/L 134  97    AST 0 - 40 IU/L 14  13    ALT 0 - 32 IU/L 11  7     Lipid Panel     Component Value Date/Time   CHOL 143 02/10/2022 0836   TRIG 80 02/10/2022 0836   HDL 49 02/10/2022 0836   CHOLHDL 3.2 01/01/2020 1414   CHOLHDL 2.4 Ratio 11/23/2006 2033   VLDL 20 11/23/2006 2033   LDLCALC 78 02/10/2022 0836    CBC    Component Value Date/Time   WBC 5.6 02/10/2022 0836   WBC 6.3 02/13/2021 1100   RBC 4.46 02/10/2022 0836   RBC 4.43 02/13/2021 1100   HGB 13.4 02/10/2022 0836   HCT 40.5 02/10/2022 0836   PLT 327 02/10/2022 0836   MCV 91 02/10/2022 0836   MCH 30.0 02/10/2022 0836   MCH 30.0 02/13/2021 1100   MCHC 33.1 02/10/2022 0836   MCHC 33.0 02/13/2021 1100   RDW 13.7 02/10/2022 0836   LYMPHSABS 3.6 (H) 02/10/2022 0836   MONOABS 0.6 01/05/2021 1754   EOSABS 0.1 02/10/2022 0836   BASOSABS 0.0 02/10/2022 0836    Results for orders placed or performed in visit on 12/14/22  CMP14+EGFR   Collection Time: 12/14/22  2:16 PM  Result Value Ref Range   Glucose 100 (H) 70 - 99 mg/dL   BUN 9 6 - 24 mg/dL   Creatinine, Ser 9.16 0.57 - 1.00 mg/dL   eGFR 86 >40 fO/fpw/8.26   BUN/Creatinine Ratio 11 9 - 23   Sodium 141 134 - 144 mmol/L   Potassium 4.1 3.5 - 5.2 mmol/L   Chloride 101 96 - 106 mmol/L   CO2 24 20 - 29 mmol/L   Calcium 9.7 8.7 - 10.2 mg/dL   Total Protein 7.2 6.0 - 8.5 g/dL   Albumin 4.1 3.9 - 4.9 g/dL   Globulin, Total 3.1 1.5 - 4.5 g/dL   Bilirubin Total 0.2 0.0 - 1.2 mg/dL   Alkaline Phosphatase 134 (H) 44 - 121 IU/L   AST 14 0 - 40 IU/L   ALT 11 0 - 32 IU/L     ASSESSMENT AND PLAN:  Assessment & Plan Eustachian tube dysfunction, left Persisting for approximately 1 month following treatment for sinus infection with amoxicillin  and Flonase . She continues to have sensation of popping and cracking in the left ear without ear pain or loss of hearing.  Sinuses  continue to feel congested and she has persistent nasal drainage. On exam left TM was slightly full suggesting eustachian tube dysfunction. She has been on Flonase  for a month without much improvement on this so we will try prednisone  burst and Valsalva maneuver She is to notify us  if her symptoms persist or worsen  Orders:   predniSONE  (DELTASONE ) 20 MG tablet; Take 2 tablets (40 mg total) by mouth daily with breakfast.    Of note she had other complaints that she will discuss with her primary provider and follow-up    Patient was given the opportunity to ask questions.  Patient verbalized understanding of the plan and was able to repeat key elements of the plan.   This documentation was completed using Paediatric nurse.  Any transcriptional errors are unintentional.     Requested Prescriptions   Signed Prescriptions Disp Refills   predniSONE  (DELTASONE ) 20 MG tablet 10 tablet 0    Sig: Take 2 tablets (  40 mg total) by mouth daily with breakfast.    Return in about 4 weeks (around 03/08/2024) for with PCP.  Adetokunbo Mccadden H, NP     [1]  Current Outpatient Medications on File Prior to Visit  Medication Sig Dispense Refill   estradiol  (CLIMARA  - DOSED IN MG/24 HR) 0.025 mg/24hr patch Place 1 patch (0.025 mg total) onto the skin once a week. 4 patch 12   valsartan -hydrochlorothiazide  (DIOVAN -HCT) 320-25 MG tablet Take 1 tablet by mouth daily. Please keep upcoming appointment for additional refills. 30 tablet 0   venlafaxine  XR (EFFEXOR -XR) 37.5 MG 24 hr capsule Take 1 capsule (37.5 mg total) by mouth daily with breakfast. Please schedule appointment with Dr. Newlin for more refills. 30 capsule 0   amoxicillin -clavulanate (AUGMENTIN ) 875-125 MG tablet Take 1 tablet by mouth every 12 (twelve) hours. (Patient not taking: Reported on 02/09/2024) 14 tablet 0   fluticasone  (FLONASE ) 50 MCG/ACT nasal spray Place 1 spray into both nostrils daily. (Patient not taking: Reported  on 02/09/2024) 16 g 0   No current facility-administered medications on file prior to visit.  [2] No Known Allergies  "

## 2024-02-13 ENCOUNTER — Telehealth: Admitting: Physician Assistant

## 2024-02-13 DIAGNOSIS — B029 Zoster without complications: Secondary | ICD-10-CM | POA: Diagnosis not present

## 2024-02-13 MED ORDER — GABAPENTIN 100 MG PO CAPS: 100.0000 mg | ORAL_CAPSULE | Freq: Three times a day (TID) | ORAL | 0 refills | Status: AC

## 2024-02-13 MED ORDER — VALACYCLOVIR HCL 1 G PO TABS: 1000.0000 mg | ORAL_TABLET | Freq: Three times a day (TID) | ORAL | 0 refills | Status: AC

## 2024-02-13 NOTE — Progress Notes (Signed)
 " Virtual Visit Consent   Renee Collier, you are scheduled for a virtual visit with a [facility] provider today. Just as with appointments in the office, your consent must be obtained to participate. Your consent will be active for this visit and any virtual visit you may have with one of our providers in the next 365 days. If you have a MyChart account, a copy of this consent can be sent to you electronically.  As this is a virtual visit, video technology does not allow for your provider to perform a traditional examination. This may limit your provider's ability to fully assess your condition. If your provider identifies any concerns that need to be evaluated in person or the need to arrange testing (such as labs, EKG, etc.), we will make arrangements to do so. Although advances in technology are sophisticated, we cannot ensure that it will always work on either your end or our end. If the connection with a video visit is poor, the visit may have to be switched to a telephone visit. With either a video or telephone visit, we are not always able to ensure that we have a secure connection.  By engaging in this virtual visit, you consent to the provision of healthcare and authorize for your insurance to be billed (if applicable) for the services provided during this visit. Depending on your insurance coverage, you may receive a charge related to this service.  I need to obtain your verbal consent now. Are you willing to proceed with your visit today? Renee Collier has provided verbal consent on 52/01/2025 for a virtual visit (video or telephone). Delon CHRISTELLA Dickinson, PA-C  Date: 02/13/2024 9:07 AM   Virtual Visit via Video Note   I, Delon CHRISTELLA Dickinson, connected with  Renee Collier  ([id number], [birth year]) on 02/13/2024 at  8:45 AM EST by a video-enabled telemedicine application and verified that I am speaking with the correct person using two identifiers.  Location: Patient: Virtual Visit Location  Patient: Home Provider: Virtual Visit Location Provider: Home Office   I discussed the limitations of evaluation and management by telemedicine and the availability of in person appointments. The patient expressed understanding and agreed to proceed.    History of Present Illness: Renee Collier is a 54 y.o. who identifies as a female who was assigned female at birth, and is being seen today for shingles.  HPI: Rash This is a new problem. The current episode started in the past 7 days (Saturday night, 02/11/24). The problem has been gradually worsening since onset. The affected locations include the back (center to right flank). The rash is characterized by redness, blistering and pain. She was exposed to nothing. Associated symptoms include fatigue. Pertinent negatives include no congestion, cough, diarrhea, fever, joint pain or shortness of breath. Treatments tried: tylenol . The treatment provided no relief.     Problems:  Patient Active Problem List   Diagnosis Date Noted   Right carpal tunnel syndrome 10/30/2021   Left carpal tunnel syndrome 10/30/2021   Prediabetes 03/31/2021   Menometrorrhagia 06/10/2020   Primary osteoarthritis of left hip 10/18/2019   Vaginal discharge 12/14/2018   Status post HTA endometrial ablation 04/10/2014   Abnormal uterine bleeding (AUB) 12/18/2013    Allergies: Allergies[1] Medications: Current Medications[2]  Observations/Objective: Patient is well-developed, well-nourished in no acute distress.  Resting comfortably at home.  Head is normocephalic, atraumatic.  No labored breathing.  Speech is clear and coherent with logical content.  Patient is alert and oriented at baseline.  Assessment and Plan: 1. Herpes zoster without complication (Primary) - valACYclovir  (VALTREX ) 1000 MG tablet; Take 1 tablet (1,000 mg total) by mouth 3 (three) times daily for 7 days.  Dispense: 21 tablet; Refill: 0 - gabapentin  (NEURONTIN ) 100 MG capsule; Take 1  capsule (100 mg total) by mouth 3 (three) times daily.  Dispense: 30 capsule; Refill: 0  - Second shingles in a year (last was 06/2023) - Valacyclovir  prescribed - Gabapentin  for pain (drowsiness precautions discussed) - Can use Tylenol  and Ibuprofen  alternating as needed - Discussed isolation precautions - Discussed vaccination - Seek in person evaluation if worsening or fails to improve   Follow Up Instructions: I discussed the assessment and treatment plan with the patient. The patient was provided an opportunity to ask questions and all were answered. The patient agreed with the plan and demonstrated an understanding of the instructions.  A copy of instructions were sent to the patient via MyChart unless otherwise noted below.    The patient was advised to call back or seek an in-person evaluation if the symptoms worsen or if the condition fails to improve as anticipated.    Delon HERO Deondre Marinaro, PA-C     [1] No Known Allergies [2]  Current Outpatient Medications:    gabapentin  (NEURONTIN ) 100 MG capsule, Take 1 capsule (100 mg total) by mouth 3 (three) times daily., Disp: 30 capsule, Rfl: 0   valACYclovir  (VALTREX ) 1000 MG tablet, Take 1 tablet (1,000 mg total) by mouth 3 (three) times daily for 7 days., Disp: 21 tablet, Rfl: 0   amoxicillin -clavulanate (AUGMENTIN ) 875-125 MG tablet, Take 1 tablet by mouth every 12 (twelve) hours. (Patient not taking: Reported on 02/09/2024), Disp: 14 tablet, Rfl: 0   estradiol  (CLIMARA  - DOSED IN MG/24 HR) 0.025 mg/24hr patch, Place 1 patch (0.025 mg total) onto the skin once a week., Disp: 4 patch, Rfl: 12   fluticasone  (FLONASE ) 50 MCG/ACT nasal spray, Place 1 spray into both nostrils daily. (Patient not taking: Reported on 02/09/2024), Disp: 16 g, Rfl: 0   predniSONE  (DELTASONE ) 20 MG tablet, Take 2 tablets (40 mg total) by mouth daily with breakfast., Disp: 10 tablet, Rfl: 0   valsartan -hydrochlorothiazide  (DIOVAN -HCT) 320-25 MG tablet, Take 1  tablet by mouth daily. Please keep upcoming appointment for additional refills., Disp: 30 tablet, Rfl: 0   venlafaxine  XR (EFFEXOR -XR) 37.5 MG 24 hr capsule, Take 1 capsule (37.5 mg total) by mouth daily with breakfast. Please schedule appointment with Dr. Newlin for more refills., Disp: 30 capsule, Rfl: 0  "

## 2024-02-13 NOTE — Patient Instructions (Signed)
 " Renee Collier, thank you for joining Delon CHRISTELLA Dickinson, PA-C for today's virtual visit.  While this provider is not your primary care provider (PCP), if your PCP is located in our provider database this encounter information will be shared with them immediately following your visit.   A [facility] MyChart account gives you access to today's visit and all your visits, tests, and labs performed at [facility]  click here if you don't have a  MyChart account or go to [email or web address]  Consent: (Patient) Renee Collier provided verbal consent for this virtual visit at the beginning of the encounter.  Current Medications:  Current Outpatient Medications:    gabapentin  (NEURONTIN ) 100 MG capsule, Take 1 capsule (100 mg total) by mouth 3 (three) times daily., Disp: 30 capsule, Rfl: 0   valACYclovir  (VALTREX ) 1000 MG tablet, Take 1 tablet (1,000 mg total) by mouth 3 (three) times daily for 7 days., Disp: 21 tablet, Rfl: 0   amoxicillin -clavulanate (AUGMENTIN ) 875-125 MG tablet, Take 1 tablet by mouth every 12 (twelve) hours. (Patient not taking: Reported on 02/09/2024), Disp: 14 tablet, Rfl: 0   estradiol  (CLIMARA  - DOSED IN MG/24 HR) 0.025 mg/24hr patch, Place 1 patch (0.025 mg total) onto the skin once a week., Disp: 4 patch, Rfl: 12   fluticasone  (FLONASE ) 50 MCG/ACT nasal spray, Place 1 spray into both nostrils daily. (Patient not taking: Reported on 02/09/2024), Disp: 16 g, Rfl: 0   predniSONE  (DELTASONE ) 20 MG tablet, Take 2 tablets (40 mg total) by mouth daily with breakfast., Disp: 10 tablet, Rfl: 0   valsartan -hydrochlorothiazide  (DIOVAN -HCT) 320-25 MG tablet, Take 1 tablet by mouth daily. Please keep upcoming appointment for additional refills., Disp: 30 tablet, Rfl: 0   venlafaxine  XR (EFFEXOR -XR) 37.5 MG 24 hr capsule, Take 1 capsule (37.5 mg total) by mouth daily with breakfast. Please schedule appointment with Dr. Newlin for more refills., Disp: 30 capsule,  Rfl: 0   Medications ordered in this encounter:  Meds ordered this encounter  Medications   valACYclovir  (VALTREX ) 1000 MG tablet    Sig: Take 1 tablet (1,000 mg total) by mouth 3 (three) times daily for 7 days.    Dispense:  21 tablet    Refill:  0    Supervising Provider:   LAMPTEY, PHILIP O [[id number]]   gabapentin  (NEURONTIN ) 100 MG capsule    Sig: Take 1 capsule (100 mg total) by mouth 3 (three) times daily.    Dispense:  30 capsule    Refill:  0    Supervising Provider:   BLAISE ALEENE KIDD [[id number]]     *If you need refills on other medications prior to your next appointment, please contact your pharmacy*  Follow-Up: Call back or seek an in-person evaluation if the symptoms worsen or if the condition fails to improve as anticipated.   Virtual Care [phone]  Other Instructions Shingles  Shingles, or herpes zoster, is an infection. It gives you a skin rash and blisters. These infected areas may hurt a lot. Shingles only happens if: You've had chickenpox. You've been given a shot called a vaccine to protect you from getting chickenpox. Shingles is rare in this case. What are the causes? Shingles is caused by a germ called the varicella-zoster virus. This is the same germ that causes chickenpox. After you're exposed to the germ, it stays in your body but is dormant. This means it isn't active. Shingles happens if the germ becomes active again. This can happen years after you're first  exposed to the germ. What increases the risk? You may be more likely to get shingles if: You're older than 52 years of age. You're under a lot of stress. You have a weak immune system. The immune system is your body's defense system. It may be weak if: You have human immunodeficiency virus (HIV). You have acquired immunodeficiency syndrome (AIDS). You have cancer. You take medicines that weaken your immune system. These include organ transplant medicines. What are the signs or  symptoms? The first symptoms of shingles may be itching, tingling, or pain. Your skin may feel like it's burning. A few days or weeks later, you'll get a rash. Here's what you can expect: The rash is likely to be on one side of your body. The rash may be shaped like a belt or a band. Over time, it will turn into blisters filled with fluid. The blisters will break open and change into scabs. The scabs will dry up in about 2-3 weeks. You may also have: A fever. Chills. A headache. Nausea. How is this diagnosed? Shingles is diagnosed with a skin exam. A sample called a culture may be taken from one of your blisters and sent to a lab. This will show if you have shingles. How is this treated? The rash may last for several weeks. There's no cure for shingles, but your health care provider may give you medicines. These medicines may: Help with pain. Help with itching. Help with irritation and swelling. Help you get better sooner. Help to prevent long-term problems. If the rash is on your face, you may need to see an eye doctor or an ear, nose, and throat (ENT) doctor. Follow these instructions at home: Medicines Take your medicines only as told by your provider. Put an anti-itch cream or numbing cream on the rash or blisters as told by your provider. Relieving itching and discomfort  To help with itching: Put cold, wet cloths called cold compresses on the rash or blisters. Take a cool bath. Try adding baking soda or dry oatmeal to the water. Do not bathe in hot water. Use calamine lotion on the rash or blisters. You can get this type of lotion at the store. Blister and rash care Keep your rash covered with a loose bandage. Wear loose clothes that don't rub on your rash. Take care of your rash as told by your provider. Make sure you: Wash your hands with soap and water for at least 20 seconds before and after you change your bandage. If you can't use soap and water, use hand  sanitizer. Keep your rash and blisters clean by washing them with mild soap and cool water. Change your bandage. Check your rash every day for signs of infection. Check for: More redness, swelling, or pain. Fluid or blood. Warmth. Pus or a bad smell. Do not scratch your rash. Do not pick at your blisters. To help you not scratch: Keep your fingernails clean and cut short. Try to wear gloves or mittens when you sleep. General instructions Rest. Wash your hands often with soap and water for at least 20 seconds. If you can't use soap and water, use hand sanitizer. Washing your hands lowers your chance of getting a skin infection. Your infection can cause chickenpox in others. If you have blisters that aren't scabs yet, stay away from: Babies. Pregnant people. Children who have eczema. Older people who have organ transplants. People who have a long-term, or chronic, illness. Anyone who hasn't had chickenpox before. Anyone who  hasn't gotten the chickenpox vaccine. How is this prevented? Vaccines are the best way to prevent you from getting chickenpox or shingles. Talk with your provider about getting these shots. Where to find more information Centers for Disease Control and Prevention (CDC): tonerpromos.no Contact a health care provider if: Your pain doesn't get better with medicine. Your pain doesn't get better after the rash heals. You have any signs of infection around the rash. Your rash or blisters get worse. You have a fever or chills. Get help right away if: The rash is on your face or nose. You have pain in your face or by your eye. You lose feeling on one side of your face. You have trouble seeing. You have ear pain or ringing in your ear. This information is not intended to replace advice given to you by your health care provider. Make sure you discuss any questions you have with your health care provider. Document Revised: 10/21/2022 Document Reviewed: 03/05/2022 Elsevier  Patient Education  2024 [employer]   If you have been instructed to have an in-person evaluation today at a local Urgent Care facility, please use the link below. It will take you to a list of all of our available [facility], including address, phone number and hours of operation. Please do not delay care.  [facility]  If you or a family member do not have a primary care provider, use the link below to schedule a visit and establish care. When you choose a [facility] primary care physician or advanced practice provider, you gain a long-term partner in health. Find a Primary Care Provider  Learn more about [facility]'s in-office and virtual care options: [facility] - Get Care Now "

## 2024-02-17 ENCOUNTER — Other Ambulatory Visit: Payer: Self-pay | Admitting: Family Medicine

## 2024-02-17 DIAGNOSIS — I10 Essential (primary) hypertension: Secondary | ICD-10-CM

## 2024-03-01 ENCOUNTER — Other Ambulatory Visit: Payer: Self-pay | Admitting: Family Medicine

## 2024-03-01 DIAGNOSIS — Z1231 Encounter for screening mammogram for malignant neoplasm of breast: Secondary | ICD-10-CM

## 2024-03-08 ENCOUNTER — Ambulatory Visit: Payer: Self-pay | Admitting: Family Medicine

## 2024-05-07 ENCOUNTER — Ambulatory Visit: Admitting: Family Medicine
